# Patient Record
Sex: Female | Born: 1950
Health system: Southern US, Community
[De-identification: ages and names within clinical notes are randomized; demographics above are authoritative.]

## PROBLEM LIST (undated history)

## (undated) DIAGNOSIS — Z8589 Personal history of malignant neoplasm of other organs and systems: Secondary | ICD-10-CM

## (undated) DIAGNOSIS — C801 Malignant (primary) neoplasm, unspecified: Secondary | ICD-10-CM

## (undated) DIAGNOSIS — F329 Major depressive disorder, single episode, unspecified: Secondary | ICD-10-CM

## (undated) DIAGNOSIS — F419 Anxiety disorder, unspecified: Secondary | ICD-10-CM

## (undated) DIAGNOSIS — K219 Gastro-esophageal reflux disease without esophagitis: Secondary | ICD-10-CM

## (undated) DIAGNOSIS — E079 Disorder of thyroid, unspecified: Secondary | ICD-10-CM

## (undated) DIAGNOSIS — F32A Depression, unspecified: Secondary | ICD-10-CM

## (undated) DIAGNOSIS — H409 Unspecified glaucoma: Secondary | ICD-10-CM

## (undated) DIAGNOSIS — M26609 Unspecified temporomandibular joint disorder, unspecified side: Secondary | ICD-10-CM

## (undated) DIAGNOSIS — M1990 Unspecified osteoarthritis, unspecified site: Secondary | ICD-10-CM

## (undated) HISTORY — DX: Unspecified temporomandibular joint disorder, unspecified side: M26.609

## (undated) HISTORY — DX: Malignant (primary) neoplasm, unspecified: C80.1

## (undated) HISTORY — PX: SQUAMOUS CELL CARCINOMA EXCISION: SHX2433

## (undated) HISTORY — PX: AUGMENTATION MAMMAPLASTY: SUR837

## (undated) HISTORY — DX: Major depressive disorder, single episode, unspecified: F32.9

## (undated) HISTORY — DX: Unspecified glaucoma: H40.9

## (undated) HISTORY — PX: ABDOMINAL HYSTERECTOMY: SHX81

## (undated) HISTORY — DX: Personal history of malignant neoplasm of other organs and systems: Z85.89

## (undated) HISTORY — PX: BREAST SURGERY: SHX581

## (undated) HISTORY — DX: Gastro-esophageal reflux disease without esophagitis: K21.9

## (undated) HISTORY — DX: Unspecified osteoarthritis, unspecified site: M19.90

## (undated) HISTORY — DX: Disorder of thyroid, unspecified: E07.9

## (undated) HISTORY — DX: Anxiety disorder, unspecified: F41.9

## (undated) HISTORY — DX: Depression, unspecified: F32.A

---

## 1996-10-07 HISTORY — PX: ANTERIOR CRUCIATE LIGAMENT REPAIR: SHX115

## 1998-04-10 ENCOUNTER — Other Ambulatory Visit: Admission: RE | Admit: 1998-04-10 | Discharge: 1998-04-10 | Payer: Self-pay | Admitting: Gynecology

## 1998-09-27 ENCOUNTER — Ambulatory Visit (HOSPITAL_COMMUNITY): Admission: RE | Admit: 1998-09-27 | Discharge: 1998-09-27 | Payer: Self-pay | Admitting: Gastroenterology

## 1999-07-05 ENCOUNTER — Other Ambulatory Visit: Admission: RE | Admit: 1999-07-05 | Discharge: 1999-07-05 | Payer: Self-pay | Admitting: Gynecology

## 1999-10-18 ENCOUNTER — Encounter: Payer: Self-pay | Admitting: Specialist

## 1999-10-18 ENCOUNTER — Encounter: Admission: RE | Admit: 1999-10-18 | Discharge: 1999-10-18 | Payer: Self-pay | Admitting: Specialist

## 1999-11-06 ENCOUNTER — Encounter: Payer: Self-pay | Admitting: Specialist

## 1999-11-06 ENCOUNTER — Encounter: Admission: RE | Admit: 1999-11-06 | Discharge: 1999-11-06 | Payer: Self-pay | Admitting: Specialist

## 2005-03-05 ENCOUNTER — Other Ambulatory Visit: Admission: RE | Admit: 2005-03-05 | Discharge: 2005-03-05 | Payer: Self-pay | Admitting: Internal Medicine

## 2005-03-26 ENCOUNTER — Ambulatory Visit (HOSPITAL_COMMUNITY): Admission: RE | Admit: 2005-03-26 | Discharge: 2005-03-26 | Payer: Self-pay | Admitting: Internal Medicine

## 2006-09-17 ENCOUNTER — Ambulatory Visit (HOSPITAL_COMMUNITY): Admission: RE | Admit: 2006-09-17 | Discharge: 2006-09-17 | Payer: Self-pay | Admitting: Internal Medicine

## 2007-09-24 ENCOUNTER — Other Ambulatory Visit: Admission: RE | Admit: 2007-09-24 | Discharge: 2007-09-24 | Payer: Self-pay | Admitting: Internal Medicine

## 2007-10-08 HISTORY — PX: COLONOSCOPY: SHX174

## 2007-10-15 ENCOUNTER — Ambulatory Visit (HOSPITAL_COMMUNITY): Admission: RE | Admit: 2007-10-15 | Discharge: 2007-10-15 | Payer: Self-pay | Admitting: Internal Medicine

## 2007-12-29 ENCOUNTER — Ambulatory Visit: Payer: Self-pay | Admitting: Internal Medicine

## 2008-03-01 ENCOUNTER — Ambulatory Visit: Payer: Self-pay | Admitting: Internal Medicine

## 2008-04-05 ENCOUNTER — Ambulatory Visit: Payer: Self-pay | Admitting: Internal Medicine

## 2008-12-13 ENCOUNTER — Ambulatory Visit (HOSPITAL_COMMUNITY): Admission: RE | Admit: 2008-12-13 | Discharge: 2008-12-13 | Payer: Self-pay | Admitting: Internal Medicine

## 2010-07-10 ENCOUNTER — Ambulatory Visit (HOSPITAL_COMMUNITY): Admission: RE | Admit: 2010-07-10 | Discharge: 2010-07-10 | Payer: Self-pay | Admitting: Internal Medicine

## 2010-07-17 ENCOUNTER — Ambulatory Visit (HOSPITAL_COMMUNITY): Admission: RE | Admit: 2010-07-17 | Discharge: 2010-07-17 | Payer: Self-pay | Admitting: Internal Medicine

## 2010-10-07 HISTORY — PX: CERVICAL FUSION: SHX112

## 2011-05-22 ENCOUNTER — Encounter (HOSPITAL_COMMUNITY)
Admission: RE | Admit: 2011-05-22 | Discharge: 2011-05-22 | Disposition: A | Discharge status: Home / Self Care | Payer: BC Managed Care – PPO | Source: Ambulatory Visit | Attending: Neurological Surgery | Admitting: Neurological Surgery

## 2011-05-22 LAB — CBC
Platelets: 250 10*3/uL (ref 150–400)
RBC: 3.8 MIL/uL — ABNORMAL LOW (ref 3.87–5.11)
RDW: 12.7 % (ref 11.5–15.5)

## 2011-05-22 LAB — SURGICAL PCR SCREEN: Staphylococcus aureus: NEGATIVE

## 2011-06-03 ENCOUNTER — Inpatient Hospital Stay (HOSPITAL_COMMUNITY): Payer: BC Managed Care – PPO

## 2011-06-03 ENCOUNTER — Inpatient Hospital Stay (HOSPITAL_COMMUNITY)
Admission: RE | Admit: 2011-06-03 | Discharge: 2011-06-05 | DRG: 864 | Disposition: A | Discharge status: Home / Self Care | Payer: BC Managed Care – PPO | Source: Ambulatory Visit | Attending: Neurological Surgery | Admitting: Neurological Surgery

## 2011-06-03 DIAGNOSIS — M19019 Primary osteoarthritis, unspecified shoulder: Secondary | ICD-10-CM | POA: Diagnosis present

## 2011-06-03 DIAGNOSIS — M25819 Other specified joint disorders, unspecified shoulder: Secondary | ICD-10-CM | POA: Diagnosis present

## 2011-06-03 DIAGNOSIS — Y832 Surgical operation with anastomosis, bypass or graft as the cause of abnormal reaction of the patient, or of later complication, without mention of misadventure at the time of the procedure: Secondary | ICD-10-CM | POA: Diagnosis not present

## 2011-06-03 DIAGNOSIS — Z01812 Encounter for preprocedural laboratory examination: Secondary | ICD-10-CM

## 2011-06-03 DIAGNOSIS — T8489XA Other specified complication of internal orthopedic prosthetic devices, implants and grafts, initial encounter: Secondary | ICD-10-CM | POA: Diagnosis not present

## 2011-06-03 DIAGNOSIS — X58XXXA Exposure to other specified factors, initial encounter: Secondary | ICD-10-CM | POA: Diagnosis present

## 2011-06-03 DIAGNOSIS — M4712 Other spondylosis with myelopathy, cervical region: Secondary | ICD-10-CM | POA: Diagnosis present

## 2011-06-03 DIAGNOSIS — S43429A Sprain of unspecified rotator cuff capsule, initial encounter: Secondary | ICD-10-CM | POA: Diagnosis present

## 2011-06-03 DIAGNOSIS — M5000 Cervical disc disorder with myelopathy, unspecified cervical region: Principal | ICD-10-CM | POA: Diagnosis present

## 2011-06-03 DIAGNOSIS — G8220 Paraplegia, unspecified: Secondary | ICD-10-CM | POA: Diagnosis not present

## 2011-06-03 LAB — DIC (DISSEMINATED INTRAVASCULAR COAGULATION)PANEL
D-Dimer, Quant: 2.02 ug/mL-FEU — ABNORMAL HIGH (ref 0.00–0.48)
INR: 1 (ref 0.00–1.49)
Platelets: DECREASED 10*3/uL (ref 150–400)
Smear Review: NONE SEEN
aPTT: 21 seconds — ABNORMAL LOW (ref 24–37)

## 2011-06-03 LAB — CBC
Hemoglobin: 11.7 g/dL — ABNORMAL LOW (ref 12.0–15.0)
Platelets: DECREASED 10*3/uL (ref 150–400)

## 2011-06-04 LAB — POCT I-STAT 4, (NA,K, GLUC, HGB,HCT): HCT: 22 % — ABNORMAL LOW (ref 36.0–46.0)

## 2011-06-07 LAB — VON WILLEBRAND FACTOR MULTIMER
Factor-VIII Activity: 155 % (ref 50–180)
Ristocetin Co-Factor: 165 % (ref 42–200)

## 2011-06-13 NOTE — Op Note (Signed)
Breanna Sparks, Breanna Sparks          ACCOUNT NO.:  [id number]  MEDICAL RECORD NO.:  [id number]  LOCATION:  3102                         FACILITY:  [facility]  PHYSICIAN:  Stefani Dama, M.D.  DATE OF BIRTH:  Nov 27, 1950  DATE OF PROCEDURE:  06/03/2011 DATE OF DISCHARGE:                              OPERATIVE REPORT   PREOPERATIVE DIAGNOSIS:  Cervical spondylosis with radiculopathy at C4- 5, C5-6, and C6-C7.  POSTOPERATIVE DIAGNOSIS:  Cervical spondylosis with radiculopathy at C4- 5, C5-6, and C6-C7.  OPERATIONS:  Anterior cervical decompression at C4-5, C5-6, and C6-7, arthrodesis with structural allograft Alphatec plate fixation at C4-C7 using a new style Trestle plate.  SURGEON:  Stefani Dama, MD  FIRST ASSISTANT:  Clydene Fake, MD  ANESTHESIA:  General endotracheal.  INDICATIONS:  Breanna Sparks is a 60 year old individual who is physically fit.  She works as a Marine scientist and has had progressive difficulty with neck, shoulder, and arm pain particularly involving the left upper extremity.  She has felt weak and has advanced spondylitic changes most notably at C4-5 and C5-6.  She is taken to the operating room at this time to undergo surgical decompression and arthrodesis at C4-5, C5-6, and C6-C7.  PROCEDURE:  The patient was brought to the operating room and placed on the table in supine position.  After smooth induction of general endotracheal anesthesia, she was placed in 5 pounds of halter traction. Neck was prepped with alcohol and DuraPrep and draped in a sterile fashion.  Transverse incision was created on the left side of the neck and this was carried down to the platysma.  Plane between the sternocleidomastoid and strap muscles was dissected bluntly until the prevertebral space was reached.  First identifiable disk space was noted to be at C6-C7.  Complete diskectomy was performed at C6-C7, but attention was then turned to C5-6 and C4-5 where complete  diskectomies were also performed.  In the dissection, there was noted to be significant uncinate hypertrophy and these were drilled down carefully. The 1 and 2 mm Kerrison punches were then used to remove redundant and thickened ligament and osteophytic spur.  Hemostasis was achieved in the lateral recesses and then ultimately a 6-mm graft was shaved to the appropriate size and configuration, placed into the interspace, and countersunk appropriately.  This was done at all three spaces at C4-5 being on second and C6-C7 being on third.  Then, a 39-mm standard-sized Alphatec plate was fitted to the ventral aspect of the vertebral bodies and secured with 8 and 12 mm screws.  Once this was placed and secured, then final radiograph identified good position of the hardware. Additional demineralized bone matrix was packed into the lateral gutters.  Once hemostasis was established, then the patient was returned to the recovery room in stable.  Wound was irrigated copiously and closed with 3-0 Vicryl in an interrupted fashion on the platysma and Dermabond was used on the skin.  The patient then returned to the recovery room in stable condition.     Stefani Dama, M.D.     [name]  D:  06/03/2011  T:  06/04/2011  Job:  [id number]  Electronically Signed by Barnett Abu M.D. on 06/13/2011 03:17:02 PM

## 2011-06-13 NOTE — Op Note (Signed)
Breanna Sparks, Breanna Sparks          ACCOUNT NO.:  [id number]  MEDICAL RECORD NO.:  [id number]  LOCATION:  3102                         FACILITY:  [facility]  PHYSICIAN:  Stefani Dama, M.D.  DATE OF BIRTH:  1950/12/06  DATE OF PROCEDURE:  06/03/2011 DATE OF DISCHARGE:                              OPERATIVE REPORT   PREOPERATIVE DIAGNOSES:  Paraplegia status post anterior cervical decompression and arthrodesis C4-5, C5-6, and C6-7.  POSTOPERATIVE DIAGNOSES:  Paraplegia status post anterior cervical decompression and arthrodesis C4-5, C5-6, and C6-7.  PROCEDURE:  Exploration of anterior cervical decompression of C4-5, C5- 6, and C6-7.  SURGEON:  Stefani Dama, MD  FIRST ASSISTANT:  Coletta Memos, MD  ANESTHESIA:  General endotracheal.  INDICATIONS:  Breanna Sparks is a 60 year old individual who underwent three-level anterior cervical decompression just few hours ago in the recovery room during the postoperative phase.  She is not able to demonstrate when she moves her leg.  She has sensation in her legs, but she was not able to move.  Her upper extremity strength appeared intact. Because of the acuity of situation, immediately, postoperatively, it was elected to emergently taker her back to the operating room to explore the cervical site.  PROCEDURE IN DETAIL:  The patient was brought to the operating room supine on the stretcher.  After smooth induction of general endotracheal anesthesia, we moved as best possible with Dermabond placed on the anterior incision and then prepped the neck with DuraPrep.  We then opened the incision with some Metzenbaum scissors and in the soft tissues space, the minimal amount of bloody fluid was encountered.  The plate was identified and then sequentially I removed 12-mm screws that were securing the plate.  Of course with removing the screws, there was considerable bleeding from the bone.  However, by isolating the lowest level, I was  able to grab the interbody graft and remove it.  There was substantial amount of blood and brisk bleeding from the disk space as the graft was removed.  Further exploration yielded some epidural bleeding from the lateral aspect of the disk space fully on the right side.  This was tamponaded with some Gelfoam and a large cottonoid and attention was turned to C5-6.  The graft was removed, and a similar area of bleeding on the right side was encountered.  There was also risk liquified blood in the epidural space that was apparent.  This was evacuated, somewhat tamponaded with Gelfoam-soaked thrombin was performed with C5-6, and C4-5 graft was removed where there was not much bleeding from the epidural space.  There was, however, some ooze. Ultimately, we used Surgifoam in an effort to try to contain  the bleeding, which did well as C4-5 and also at C5-6, but at C6-7, persistent area of epidural bleeding from the region of the right nerve root persisted despite multiple packings of Surgifoam, Gelfoam, and cottonoid tamponade.  Ultimately, I was able to use electric bipolar in the epidural vessels, which secured the bleeding from this epidural space.  Care was then taken to explore every aspect of the disk spaces at the C6-7, C5-6, and C4-5 then we carefully ascertained good hemostasis.  This did take some effort, and the  patient seemed to ooze during that portion of the case as much as she did during the first portion of the case.  We sent off some coagulation studies including a PT/PTT and a platelet count, which are not back at this writing.  After we had secured the hemostasis, we replaced the bone graft and again carefully checked each interspace for adequate hemostasis.  When this was verified, I secured the 39-mm plate with the original screws.  The soft tissue hemostasis was then obtained meticulously because of the amount of oozing that there was, we elected to place a 7-mm  Al Pimple drain into the prevertebral space.  Once this was placed and the wound was inspected for hemostasis, then we finally closed the platysma with 3-0 Vicryl in interrupted fashion, and 3-0 Vicryl was used in subcuticular tissue.  A dry gauze dressing was placed on the patient's neck.  Blood loss for this portion of the procedure was estimated about 150 mL.  The patient was returned to the recovery room for later stay in the ICU.     Stefani Dama, M.D.     [name]  D:  06/03/2011  T:  06/04/2011  Job:  [id number]  Electronically Signed by Barnett Abu M.D. on 06/13/2011 03:17:05 PM

## 2011-07-23 ENCOUNTER — Other Ambulatory Visit (HOSPITAL_COMMUNITY): Payer: Self-pay | Admitting: Internal Medicine

## 2011-07-23 DIAGNOSIS — Z1231 Encounter for screening mammogram for malignant neoplasm of breast: Secondary | ICD-10-CM

## 2011-08-12 ENCOUNTER — Ambulatory Visit (HOSPITAL_COMMUNITY)
Admission: RE | Admit: 2011-08-12 | Discharge: 2011-08-12 | Disposition: A | Discharge status: Home / Self Care | Payer: BC Managed Care – PPO | Source: Ambulatory Visit | Attending: Internal Medicine | Admitting: Internal Medicine

## 2011-08-12 DIAGNOSIS — Z1231 Encounter for screening mammogram for malignant neoplasm of breast: Secondary | ICD-10-CM | POA: Insufficient documentation

## 2012-09-07 ENCOUNTER — Other Ambulatory Visit (HOSPITAL_COMMUNITY): Payer: Self-pay | Admitting: Internal Medicine

## 2012-09-07 DIAGNOSIS — Z1231 Encounter for screening mammogram for malignant neoplasm of breast: Secondary | ICD-10-CM

## 2012-09-24 ENCOUNTER — Ambulatory Visit (HOSPITAL_COMMUNITY)
Admission: RE | Admit: 2012-09-24 | Discharge: 2012-09-24 | Disposition: A | Discharge status: Home / Self Care | Payer: BC Managed Care – PPO | Source: Ambulatory Visit | Attending: Internal Medicine | Admitting: Internal Medicine

## 2012-09-24 DIAGNOSIS — Z1231 Encounter for screening mammogram for malignant neoplasm of breast: Secondary | ICD-10-CM

## 2013-09-06 ENCOUNTER — Other Ambulatory Visit: Payer: Self-pay | Admitting: Physician Assistant

## 2013-09-06 DIAGNOSIS — Z1231 Encounter for screening mammogram for malignant neoplasm of breast: Secondary | ICD-10-CM

## 2013-09-28 ENCOUNTER — Other Ambulatory Visit: Payer: Self-pay | Admitting: Physician Assistant

## 2013-09-28 ENCOUNTER — Ambulatory Visit (HOSPITAL_COMMUNITY)
Admission: RE | Admit: 2013-09-28 | Discharge: 2013-09-28 | Disposition: A | Discharge status: Home / Self Care | Payer: BC Managed Care – PPO | Source: Ambulatory Visit | Attending: Physician Assistant | Admitting: Physician Assistant

## 2013-09-28 DIAGNOSIS — Z1231 Encounter for screening mammogram for malignant neoplasm of breast: Secondary | ICD-10-CM

## 2013-10-25 ENCOUNTER — Other Ambulatory Visit: Payer: Self-pay | Admitting: Internal Medicine

## 2013-11-14 DIAGNOSIS — F419 Anxiety disorder, unspecified: Secondary | ICD-10-CM | POA: Insufficient documentation

## 2013-11-14 DIAGNOSIS — Z8589 Personal history of malignant neoplasm of other organs and systems: Secondary | ICD-10-CM | POA: Insufficient documentation

## 2013-11-14 DIAGNOSIS — M1990 Unspecified osteoarthritis, unspecified site: Secondary | ICD-10-CM | POA: Insufficient documentation

## 2013-11-14 DIAGNOSIS — F325 Major depressive disorder, single episode, in full remission: Secondary | ICD-10-CM | POA: Insufficient documentation

## 2013-11-14 DIAGNOSIS — H409 Unspecified glaucoma: Secondary | ICD-10-CM | POA: Insufficient documentation

## 2013-11-16 ENCOUNTER — Ambulatory Visit (INDEPENDENT_AMBULATORY_CARE_PROVIDER_SITE_OTHER): Payer: BC Managed Care – PPO | Admitting: Physician Assistant

## 2013-11-16 ENCOUNTER — Encounter: Payer: Self-pay | Admitting: Physician Assistant

## 2013-11-16 VITALS — BP 102/60 | HR 60 | Temp 98.2°F | Resp 16 | Ht 65.0 in | Wt 125.0 lb

## 2013-11-16 DIAGNOSIS — F32A Depression, unspecified: Secondary | ICD-10-CM

## 2013-11-16 DIAGNOSIS — Z79899 Other long term (current) drug therapy: Secondary | ICD-10-CM

## 2013-11-16 DIAGNOSIS — E559 Vitamin D deficiency, unspecified: Secondary | ICD-10-CM

## 2013-11-16 DIAGNOSIS — M1990 Unspecified osteoarthritis, unspecified site: Secondary | ICD-10-CM

## 2013-11-16 DIAGNOSIS — Z0000 Encounter for general adult medical examination without abnormal findings: Secondary | ICD-10-CM

## 2013-11-16 DIAGNOSIS — F329 Major depressive disorder, single episode, unspecified: Secondary | ICD-10-CM

## 2013-11-16 DIAGNOSIS — Z23 Encounter for immunization: Secondary | ICD-10-CM

## 2013-11-16 DIAGNOSIS — M25519 Pain in unspecified shoulder: Secondary | ICD-10-CM

## 2013-11-16 DIAGNOSIS — Z1211 Encounter for screening for malignant neoplasm of colon: Secondary | ICD-10-CM

## 2013-11-16 DIAGNOSIS — M129 Arthropathy, unspecified: Secondary | ICD-10-CM

## 2013-11-16 LAB — HEPATIC FUNCTION PANEL
ALBUMIN: 4.3 g/dL (ref 3.5–5.2)
ALT: 36 U/L — ABNORMAL HIGH (ref 0–35)
AST: 41 U/L — AB (ref 0–37)
Alkaline Phosphatase: 50 U/L (ref 39–117)
BILIRUBIN DIRECT: 0.1 mg/dL (ref 0.0–0.3)
Indirect Bilirubin: 0.5 mg/dL (ref 0.2–1.2)
TOTAL PROTEIN: 6.6 g/dL (ref 6.0–8.3)
Total Bilirubin: 0.6 mg/dL (ref 0.2–1.2)

## 2013-11-16 LAB — LIPID PANEL
Cholesterol: 214 mg/dL — ABNORMAL HIGH (ref 0–200)
HDL: 85 mg/dL (ref >39)
LDL CALC: 106 mg/dL — AB (ref 0–99)
Total CHOL/HDL Ratio: 2.5 Ratio
Triglycerides: 113 mg/dL (ref <150)
VLDL: 23 mg/dL (ref 0–40)

## 2013-11-16 LAB — CBC WITH DIFFERENTIAL/PLATELET
BASOS ABS: 0 10*3/uL (ref 0.0–0.1)
BASOS PCT: 0 % (ref 0–1)
EOS ABS: 0.3 10*3/uL (ref 0.0–0.7)
EOS PCT: 5 % (ref 0–5)
HCT: 38.9 % (ref 36.0–46.0)
HEMOGLOBIN: 13.2 g/dL (ref 12.0–15.0)
LYMPHS ABS: 2.2 10*3/uL (ref 0.7–4.0)
LYMPHS PCT: 40 % (ref 12–46)
MCH: 34 pg (ref 26.0–34.0)
MCHC: 33.9 g/dL (ref 30.0–36.0)
MCV: 100.3 fL — ABNORMAL HIGH (ref 78.0–100.0)
MONO ABS: 0.4 10*3/uL (ref 0.1–1.0)
Monocytes Relative: 8 % (ref 3–12)
NEUTROS ABS: 2.5 10*3/uL (ref 1.7–7.7)
Neutrophils Relative %: 47 % (ref 43–77)
Platelets: 302 10*3/uL (ref 150–400)
RBC: 3.88 MIL/uL (ref 3.87–5.11)
RDW: 13.2 % (ref 11.5–15.5)
WBC: 5.4 10*3/uL (ref 4.0–10.5)

## 2013-11-16 LAB — BASIC METABOLIC PANEL WITH GFR
BUN: 19 mg/dL (ref 6–23)
CHLORIDE: 101 meq/L (ref 96–112)
CO2: 27 meq/L (ref 19–32)
Calcium: 9.1 mg/dL (ref 8.4–10.5)
Creat: 0.81 mg/dL (ref 0.50–1.10)
GFR, Est African American: 89 mL/min
GFR, Est Non African American: 78 mL/min
GLUCOSE: 89 mg/dL (ref 70–99)
POTASSIUM: 4.2 meq/L (ref 3.5–5.3)
SODIUM: 136 meq/L (ref 135–145)

## 2013-11-16 LAB — IRON AND TIBC
%SAT: 42 % (ref 20–55)
Iron: 143 ug/dL (ref 42–145)
TIBC: 343 ug/dL (ref 250–470)
UIBC: 200 ug/dL (ref 125–400)

## 2013-11-16 LAB — MAGNESIUM: Magnesium: 2 mg/dL (ref 1.5–2.5)

## 2013-11-16 MED ORDER — ZOSTER VACCINE LIVE 19400 UNT/0.65ML ~~LOC~~ SOLR: 0.6500 mL | Freq: Once | SUBCUTANEOUS | Status: DC

## 2013-11-16 MED ORDER — DEXAMETHASONE SODIUM PHOSPHATE 100 MG/10ML IJ SOLN
10.0000 mg | Freq: Once | INTRAMUSCULAR | Status: AC
  Administered 2013-11-16: 10 mg via INTRAMUSCULAR

## 2013-11-16 MED ORDER — LORAZEPAM 2 MG PO TABS: ORAL_TABLET | ORAL | Status: DC

## 2013-11-16 MED ORDER — VALACYCLOVIR HCL 1 G PO TABS: 1000.0000 mg | ORAL_TABLET | Freq: Every day | ORAL | Status: DC

## 2013-11-16 MED ORDER — CYCLOBENZAPRINE HCL 10 MG PO TABS: 10.0000 mg | ORAL_TABLET | Freq: Three times a day (TID) | ORAL | Status: DC | PRN

## 2013-11-16 MED ORDER — CITALOPRAM HYDROBROMIDE 40 MG PO TABS: 40.0000 mg | ORAL_TABLET | Freq: Every day | ORAL | Status: DC

## 2013-11-16 NOTE — Progress Notes (Signed)
Complete Physical HPI 63 y.o. female  presents for a complete physical. Her blood pressure has been controlled at home, today their BP is BP: 102/60 mmHg She denies chest pain, shortness of breath, dizziness.  Her cholesterol is diet controlled.  Her cholesterol is controlled. The cholesterol last visit was:  LDL 114 States energy level has been down, has not worked out often over past year.  Patient is on Vitamin D supplement.  History of SCC, follows with DERM q 3 months.  Patient is on Celexa 20 and states she is doing well with it.  Complains left shoulder pain, no numbness tingling, pain with abduction.   Current Medications:  Current Outpatient Prescriptions on File Prior to Visit  Medication Sig Dispense Refill  . citalopram (CELEXA) 40 MG tablet Take 40 mg by mouth daily.      . Cyanocobalamin (VITAMIN B-12 PO) Take by mouth daily.      Marland Kitchen LORazepam (ATIVAN) 2 MG tablet TAKE ONE-HALF TO ONE TABLET BY MOUTH AT BEDTIME AS NEEDED  60 tablet  0   No current facility-administered medications on file prior to visit.   Health Maintenance:  Tetanus: 2014 Pneumovax: N/A Flu vaccine: 06/2013 Zostavax: covered by insurance Pap: TAH not needed MGM: 09/2013 neg DEXA: ? Small frame, no history of recent broken bones, early menopause (age 84) currently on estrogen/progesterone cream, not smoker.  Colonoscopy: 03/2008- repeat 10 years Dr. Olevia Perches EGD: 03/2008   Allergies:  Allergies  Allergen Reactions  . Codeine     REACTION: Itch/nausea/vomiting  . Meperidine Hcl     REACTION: itch  . Morphine     REACTION: tachycardia   Medical History:  Past Medical History  Diagnosis Date  . Depression   . Anxiety   . Glaucoma   . Arthritis   . History of squamous cell carcinoma    Surgical History:  Past Surgical History  Procedure Laterality Date  . Abdominal hysterectomy      age 1  . Breast surgery      implants  . Cervical fusion  2012    Dr. Ellene Route  . Squamous cell  carcinoma excision      Multiple  . Anterior cruciate ligament repair Bilateral 1998   Family History:  Family History  Problem Relation Age of Onset  . Hypertension Mother   . Diabetes Mother   . Heart disease Mother   . Glaucoma Mother   . Heart attack Father    Social History:  History   Social History  . Marital Status: Married    Spouse Name: N/A    Number of Children: N/A  . Years of Education: N/A   Occupational History  . Not on file.   Social History Main Topics  . Smoking status: Never Smoker   . Smokeless tobacco: Not on file  . Alcohol Use: Yes     Comment: Occasion  . Drug Use: No  . Sexual Activity: Not on file   Other Topics Concern  . Not on file   Social History Narrative  . No narrative on file   ROS Constitutional: Denies weight loss/gain, headaches, insomnia, fatigue, night sweats, and change in appetite. Eyes: Dr. Nicki Reaper  DEE q 3 months, + [place] Denies redness, blurred vision, diplopia, discharge, itchy, watery eyes.  ENT: Denies discharge, congestion, post nasal drip, sore throat, earache, hearing loss, dental pain, Tinnitus, Vertigo, Sinus pain, snoring.  Cardio: Denies chest pain, palpitations, irregular heartbeat, dyspnea, diaphoresis, orthopnea, PND, claudication, edema Respiratory: denies  cough, dyspnea, pleurisy, hoarseness, wheezing.  Gastrointestinal: Dr. Olevia Perches + constipation Denies dysphagia, heartburn, pain, cramps, nausea, vomiting, bloating, diarrhea,  hematemesis, melena, hematochezia, hemorrhoids Genitourinary: Denies dysuria, frequency, urgency, nocturia, hesitancy, discharge, hematuria, flank pain Breast: Denies Breast lumps, nipple discharge, bleeding.  Musculoskeletal: (Dr. Ellene Route- neck) + myalgia, joint pain, complains of right finger being locked in the morning. Denies arthralgia, myalgia, stiffness, Jt. Swelling, pain, Skin: (Dr. [place])  Denies pruritis, rash, hives,  acne, eczema, changing in skin lesion Neuro: Denies  Weakness, tremor, incoordination, spasms, paresthesia, pain Psychiatric: Denies confusion, memory loss, sensory loss Endocrine: Denies change in weight, skin, hair change, nocturia, and paresthesia, Diabetic Denies Polys, visual blurring, hyper /hypo glycemic episodes.  Heme/Lymph: Denies Excessive bleeding, bruising, enlarged lymph nodes  Physical Exam: Estimated body mass index is 20.8 kg/(m^2) as calculated from the following:   Height as of this encounter: 5\' 5"  (1.651 m).   Weight as of this encounter: 125 lb (56.7 kg). Filed Vitals:   11/16/13 0916  BP: 102/60  Pulse: 60  Temp: 98.2 F (36.8 C)  Resp: 16   General Appearance: Well nourished, in no apparent distress. Eyes: PERRLA, EOMs, conjunctiva no swelling or erythema, normal fundi and vessels. Sinuses: No Frontal/maxillary tenderness ENT/Mouth: Ext aud canals clear, normal light reflex with TMs without erythema, bulging.  Good dentition. No erythema, swelling, or exudate on post pharynx. Tonsils not swollen or erythematous. Hearing normal.  Neck: Supple, thyroid normal. No bruits Respiratory: Respiratory effort normal, BS equal bilaterally without rales, rhonchi, wheezing or stridor. Cardio: RRR without murmurs, rubs or gallops. Brisk peripheral pulses without edema.  Chest: symmetric, with normal excursions and percussion. Breasts: Symmetric, without lumps, nipple discharge, retractions. + bilateral implants Abdomen: Soft, +BS. Non tender, no guarding, rebound, hernias, masses, or organomegaly. .  Lymphatics: Non tender without lymphadenopathy.  Genitourinary: defer Musculoskeletal: Full ROM all peripheral extremities,5/5 strength, and normal gait. Left shoulder with tenderness pinpoint at left posterior shoulder, mild pain with abduction to 180.  Skin: Warm, dry without rashes, lesions, ecchymosis.  Neuro: Cranial nerves intact, reflexes equal bilaterally. Normal muscle tone, no cerebellar symptoms. Sensation intact.   Psych: Awake and oriented X 3, normal affect, Insight and Judgment appropriate.   EKG: WNL no changes.  Assessment and Plan: Depression- continue citalopram, doing well  Anxiety- continue meds  Glaucoma- continue drops, continue follow up.   Arthritis- some trigger finer  History of squamous cell carcinoma- continue derm follow up  Insomnia- cont ativan at night Shingles vaccine today Pap- not needed Left shoulder pain- exercise, trigger point injection given.  Discussed med's effects and SE's. Screening labs and tests as requested with regular follow-up as recommended.   Vicie Mutters 9:39 AM

## 2013-11-16 NOTE — Patient Instructions (Addendum)
[place] look on [place] What is the TMJ? The temporomandibular (tem-PUH-ro-man-DIB-yoo-ler) joint, or the TMJ, connects the upper and lower jawbones. This joint allows the jaw to open wide and move back and forth when you chew, talk, or yawn.There are also several muscles that help this joint move. There can be muscle tightness and pain in the muscle that can cause several symptoms.  What causes TMJ pain? There are many causes of TMJ pain. Repeated chewing (for example, chewing gum) and clenching your teeth can cause pain in the joint. Some TMJ pain has no obvious cause. What can I do to ease the pain? There are many things you can do to help your pain get better. When you have pain:  Eat soft foods and stay away from chewy foods (for example, taffy) Try to use both sides of your mouth to chew Don't chew gum Don't open your mouth wide (for example, during yawning or singing) Don't bite your cheeks or fingernails Lower your amount of stress and worry Applying a warm, damp washcloth to the joint may help. Over-the-counter pain medicines such as ibuprofen (one brand: Advil) or acetaminophen (one brand: Tylenol) might also help. Do not use these medicines if you are allergic to them or if your doctor told you not to use them. How can I stop the pain from coming back? When your pain is better, you can do these exercises to make your muscles stronger and to keep the pain from coming back:  Resisted mouth opening: Place your thumb or two fingers under your chin and open your mouth slowly, pushing up lightly on your chin with your thumb. Hold for three to six seconds. Close your mouth slowly. Resisted mouth closing: Place your thumbs under your chin and your two index fingers on the ridge between your mouth and the bottom of your chin. Push down lightly on your chin as you close your mouth. Tongue up: Slowly open and close your mouth while keeping the tongue touching the roof of the mouth. Side-to-side  jaw movement: Place an object about one fourth of an inch thick (for example, two tongue depressors) between your front teeth. Slowly move your jaw from side to side. Increase the thickness of the object as the exercise becomes easier Forward jaw movement: Place an object about one fourth of an inch thick between your front teeth and move the bottom jaw forward so that the bottom teeth are in front of the top teeth. Increase the thickness of the object as the exercise becomes easier. These exercises should not be painful. If it hurts to do these exercises, stop doing them and talk to your family doctor.   Diet for Gastroesophageal Reflux Disease, Adult Reflux (acid reflux) is when acid from your stomach flows up into the esophagus. When acid comes in contact with the esophagus, the acid causes irritation and soreness (inflammation) in the esophagus. When reflux happens often or so severely that it causes damage to the esophagus, it is called gastroesophageal reflux disease (GERD). Nutrition therapy can help ease the discomfort of GERD. FOODS OR DRINKS TO AVOID OR LIMIT  Smoking or chewing tobacco. Nicotine is one of the most potent stimulants to acid production in the gastrointestinal tract.  Caffeinated and decaffeinated coffee and black tea.  Regular or low-calorie carbonated beverages or energy drinks (caffeine-free carbonated beverages are allowed).   Strong spices, such as black pepper, white pepper, red pepper, cayenne, curry powder, and chili powder.  Peppermint or spearmint.  Chocolate.  High-fat foods, including meats and fried foods. Extra added fats including oils, butter, salad dressings, and nuts. Limit these to less than 8 tsp per day.  Fruits and vegetables if they are not tolerated, such as citrus fruits or tomatoes.  Alcohol.  Any food that seems to aggravate your condition. If you have questions regarding your diet, call your caregiver or a registered dietitian. OTHER  THINGS THAT MAY HELP GERD INCLUDE:   Eating your meals slowly, in a relaxed setting.  Eating 5 to 6 small meals per day instead of 3 large meals.  Eliminating food for a period of time if it causes distress.  Not lying down until 3 hours after eating a meal.  Keeping the head of your bed raised 6 to 9 inches (15 to 23 cm) by using a foam wedge or blocks under the legs of the bed. Lying flat may make symptoms worse.  Being physically active. Weight loss may be helpful in reducing reflux in overweight or obese adults.  Wear loose fitting clothing EXAMPLE MEAL PLAN This meal plan is approximately 2,000 calories based on [email or web address] meal planning guidelines. Breakfast   cup cooked oatmeal.  1 cup strawberries.  1 cup low-fat milk.  1 oz almonds. Snack  1 cup cucumber slices.  6 oz yogurt (made from low-fat or fat-free milk). Lunch  2 slice whole-wheat bread.  2 oz sliced [place].  2 tsp mayonnaise.  1 cup blueberries.  1 cup snap peas. Snack  6 whole-wheat crackers.  1 oz string cheese. Dinner   cup brown rice.  1 cup mixed veggies.  1 tsp olive oil.  3 oz grilled fish. Document Released: 09/23/2005 Document Revised: 12/16/2011 Document Reviewed: 08/09/2011 [facility] Patient Information 2014 [place], [place].

## 2013-11-17 LAB — MICROALBUMIN / CREATININE URINE RATIO
Creatinine, Urine: 70.1 mg/dL
Microalb Creat Ratio: 7.1 mg/g (ref 0.0–30.0)
Microalb, Ur: 0.5 mg/dL (ref 0.00–1.89)

## 2013-11-17 LAB — URINALYSIS, ROUTINE W REFLEX MICROSCOPIC
BILIRUBIN URINE: NEGATIVE
Glucose, UA: NEGATIVE mg/dL
HGB URINE DIPSTICK: NEGATIVE
KETONES UR: NEGATIVE mg/dL
LEUKOCYTES UA: NEGATIVE
NITRITE: NEGATIVE
Protein, ur: NEGATIVE mg/dL
SPECIFIC GRAVITY, URINE: 1.017 (ref 1.005–1.030)
UROBILINOGEN UA: 0.2 mg/dL (ref 0.0–1.0)
pH: 6 (ref 5.0–8.0)

## 2013-11-17 LAB — VITAMIN B12: VITAMIN B 12: 546 pg/mL (ref 211–911)

## 2013-11-17 LAB — INSULIN, FASTING: INSULIN FASTING, SERUM: 2 u[IU]/mL — AB (ref 3–28)

## 2013-11-17 LAB — HEMOGLOBIN A1C
HEMOGLOBIN A1C: 5.1 % (ref <5.7)
MEAN PLASMA GLUCOSE: 100 mg/dL (ref <117)

## 2013-11-17 LAB — VITAMIN D 25 HYDROXY (VIT D DEFICIENCY, FRACTURES): VIT D 25 HYDROXY: 95 ng/mL — AB (ref 30–89)

## 2013-11-17 LAB — TSH: TSH: 2.074 u[IU]/mL (ref 0.350–4.500)

## 2014-01-12 ENCOUNTER — Ambulatory Visit (INDEPENDENT_AMBULATORY_CARE_PROVIDER_SITE_OTHER): Payer: BC Managed Care – PPO | Admitting: Physician Assistant

## 2014-01-12 ENCOUNTER — Encounter: Payer: Self-pay | Admitting: Physician Assistant

## 2014-01-12 VITALS — BP 120/70 | HR 72 | Temp 97.9°F | Resp 16 | Ht 65.0 in | Wt 123.0 lb

## 2014-01-12 DIAGNOSIS — F3289 Other specified depressive episodes: Secondary | ICD-10-CM

## 2014-01-12 DIAGNOSIS — F329 Major depressive disorder, single episode, unspecified: Secondary | ICD-10-CM

## 2014-01-12 DIAGNOSIS — F32A Depression, unspecified: Secondary | ICD-10-CM

## 2014-01-12 MED ORDER — ESCITALOPRAM OXALATE 20 MG PO TABS: 20.0000 mg | ORAL_TABLET | Freq: Every day | ORAL | Status: DC

## 2014-01-12 NOTE — Patient Instructions (Signed)
Seasonal Affective Disorder  A seasonal affective disorder is a depressive reaction. It is when you feel emotionally down, which seems to come at specific times of the year. The most common time of year for this is winter. Otherwise, it behaves like a plain depression. As with other depressive disorders, there are:  Crying episodes.  Headaches.  Irritability.  Loss of energy. DIAGNOSIS  The diagnosis of this problem is usually made by the history (what has been going on). A physical exam may be done to make sure there is no other cause of your depression. TREATMENT  The treatment of seasonal affective disorders has been found to be helped immensely by photo-therapy. This means a person sits or lies for several hours per day in front of or under bright lights. The symptoms (problems) of depression respond rapidly, usually over a couple days. HOME CARE INSTRUCTIONS   Follow your caregiver's instructions for light therapy.  You must be awake during the light therapy.  If you do not respond or you feel you are getting worse, see your caregiver. Document Released: 06/18/2001 Document Revised: 12/16/2011 Document Reviewed: 01/09/2006 [facility] Patient Information 2014 [place], [place]. Depression, Adult Depression refers to feeling sad, low, down in the dumps, blue, gloomy, or empty. In general, there are two kinds of depression: 1. Depression that we all experience from time to time because of upsetting life experiences, including the loss of a job or the ending of a relationship (normal sadness or normal grief). This kind of depression is considered normal, is short lived, and resolves within a few days to 2 weeks. (Depression experienced after the loss of a loved one is called bereavement. Bereavement often lasts longer than 2 weeks but normally gets better with time.) 2. Clinical depression, which lasts longer than normal sadness or normal grief or interferes with your ability to function at  home, at work, and in school. It also interferes with your personal relationships. It affects almost every aspect of your life. Clinical depression is an illness. Symptoms of depression also can be caused by conditions other than normal sadness and grief or clinical depression. Examples of these conditions are listed as follows:  Physical illness Some physical illnesses, including underactive thyroid gland (hypothyroidism), severe anemia, specific types of cancer, diabetes, uncontrolled seizures, heart and lung problems, strokes, and chronic pain are commonly associated with symptoms of depression.  Side effects of some prescription medicine In some people, certain types of prescription medicine can cause symptoms of depression.  Substance abuse Abuse of alcohol and illicit drugs can cause symptoms of depression. SYMPTOMS Symptoms of normal sadness and normal grief include the following:  Feeling sad or crying for short periods of time.  Not caring about anything (apathy).  Difficulty sleeping or sleeping too much.  No longer able to enjoy the things you used to enjoy.  Desire to be by oneself all the time (social isolation).  Lack of energy or motivation.  Difficulty concentrating or remembering.  Change in appetite or weight.  Restlessness or agitation. Symptoms of clinical depression include the same symptoms of normal sadness or normal grief and also the following symptoms:  Feeling sad or crying all the time.  Feelings of guilt or worthlessness.  Feelings of hopelessness or helplessness.  Thoughts of suicide or the desire to harm yourself (suicidal ideation).  Loss of touch with reality (psychotic symptoms). Seeing or hearing things that are not real (hallucinations) or having false beliefs about your life or the people around you (delusions  and paranoia). DIAGNOSIS  The diagnosis of clinical depression usually is based on the severity and duration of the symptoms. Your  caregiver also will ask you questions about your medical history and substance use to find out if physical illness, use of prescription medicine, or substance abuse is causing your depression. Your caregiver also may order blood tests. TREATMENT  Typically, normal sadness and normal grief do not require treatment. However, sometimes antidepressant medicine is prescribed for bereavement to ease the depressive symptoms until they resolve. The treatment for clinical depression depends on the severity of your symptoms but typically includes antidepressant medicine, counseling with a mental health professional, or a combination of both. Your caregiver will help to determine what treatment is best for you. Depression caused by physical illness usually goes away with appropriate medical treatment of the illness. If prescription medicine is causing depression, talk with your caregiver about stopping the medicine, decreasing the dose, or substituting another medicine. Depression caused by abuse of alcohol or illicit drugs abuse goes away with abstinence from these substances. Some adults need professional help in order to stop drinking or using drugs. SEEK IMMEDIATE CARE IF:  You have thoughts about hurting yourself or others.  You lose touch with reality (have psychotic symptoms).  You are taking medicine for depression and have a serious side effect. FOR MORE INFORMATION National [employer] on Mental Illness: www.nami.[employer]: https://carter.com/ Document Released: 09/20/2000 Document Revised: 03/24/2012 Document Reviewed: 12/23/2011 [facility] Patient Information 2014 [place].

## 2014-01-12 NOTE — Progress Notes (Signed)
   Subjective:    Patient ID: Breanna Sparks, female    DOB: [birth year], 63 y.o.   MRN: [id number]  HPI 63 y.o. presents for worsening depression/anxiety since the holidays. She states that her son had a suicide attempt and she has been worried about him. She is crying now talking about it, she states she does not want to get out of the bed, increased worry, crying, sleeping too much, decreased interest in doing things. She states it is worse during the winter, she will not leave the house for 3 weeks at a time, which she states tearfully that that is not her nature. She is not working out/exercising. She has felt a bit better with the weather improving, but she has had several skin surgeries due to [facility] so she is unable to get in the sun as she has in the past. She has had suicidal ideations but denies a plan. She has tried zoloft, wellbutrin, and prozac in the past without in any help. She has been cymbalta in the past but states it was too expensive and stopped.   Review of Systems  Constitutional: Positive for activity change, appetite change and fatigue. Negative for fever, diaphoresis and unexpected weight change.  HENT: Negative.   Respiratory: Negative.   Cardiovascular: Positive for palpitations (with panic attacks). Negative for chest pain and leg swelling.  Gastrointestinal: Negative.   Genitourinary: Negative.   Musculoskeletal: Negative.   Neurological: Negative.   Psychiatric/Behavioral: Positive for suicidal ideas (denies plan or intention to do it), sleep disturbance, dysphoric mood and decreased concentration. Negative for hallucinations, behavioral problems, confusion, self-injury and agitation. The patient is nervous/anxious. The patient is not hyperactive.        Objective:   Physical Exam  Constitutional: She is oriented to person, place, and time. She appears well-developed and well-nourished.  HENT:  Head: Normocephalic and atraumatic.  Right Ear: External ear  normal.  Left Ear: External ear normal.  Mouth/Throat: Oropharynx is clear and moist.  Eyes: Conjunctivae and EOM are normal. Pupils are equal, round, and reactive to light.  Neck: Normal range of motion. Neck supple. No thyromegaly present.  Cardiovascular: Normal rate, regular rhythm and normal heart sounds.  Exam reveals no gallop and no friction rub.   No murmur heard. Pulmonary/Chest: Effort normal and breath sounds normal. No respiratory distress. She has no wheezes.  Abdominal: Soft. Bowel sounds are normal. She exhibits no distension and no mass. There is no tenderness. There is no rebound and no guarding.  Musculoskeletal: Normal range of motion.  Lymphadenopathy:    She has no cervical adenopathy.  Neurological: She is alert and oriented to person, place, and time. She displays normal reflexes. No cranial nerve deficit. Coordination normal.  Skin: Skin is warm and dry.  Psychiatric: Her speech is not rapid and/or pressured, not delayed and not slurred. She exhibits a depressed mood. She expresses no homicidal and no suicidal ideation. She expresses no suicidal plans and no homicidal plans.       Assessment & Plan:  Depression/SAD- suggest counseling, increase exercise, 2 weeks follow up and will check labs at that time. Stop celexa and can switch to lexapro 20mg  Daily.

## 2014-02-03 ENCOUNTER — Ambulatory Visit (INDEPENDENT_AMBULATORY_CARE_PROVIDER_SITE_OTHER): Payer: BC Managed Care – PPO | Admitting: Physician Assistant

## 2014-02-03 ENCOUNTER — Encounter: Payer: Self-pay | Admitting: Physician Assistant

## 2014-02-03 VITALS — BP 128/70 | HR 64 | Temp 98.9°F | Resp 16 | Wt 122.0 lb

## 2014-02-03 DIAGNOSIS — F329 Major depressive disorder, single episode, unspecified: Secondary | ICD-10-CM

## 2014-02-03 DIAGNOSIS — F3289 Other specified depressive episodes: Secondary | ICD-10-CM

## 2014-02-03 DIAGNOSIS — G4700 Insomnia, unspecified: Secondary | ICD-10-CM

## 2014-02-03 DIAGNOSIS — F32A Depression, unspecified: Secondary | ICD-10-CM

## 2014-02-03 DIAGNOSIS — F411 Generalized anxiety disorder: Secondary | ICD-10-CM

## 2014-02-03 DIAGNOSIS — F419 Anxiety disorder, unspecified: Secondary | ICD-10-CM

## 2014-02-03 DIAGNOSIS — Z23 Encounter for immunization: Secondary | ICD-10-CM

## 2014-02-03 MED ORDER — ESCITALOPRAM OXALATE 20 MG PO TABS: 20.0000 mg | ORAL_TABLET | Freq: Every day | ORAL | Status: DC

## 2014-02-03 MED ORDER — OMEPRAZOLE 40 MG PO CPDR: 40.0000 mg | DELAYED_RELEASE_CAPSULE | Freq: Every day | ORAL | Status: DC

## 2014-02-03 MED ORDER — CYCLOBENZAPRINE HCL 10 MG PO TABS: 10.0000 mg | ORAL_TABLET | Freq: Three times a day (TID) | ORAL | Status: DC | PRN

## 2014-02-03 MED ORDER — TRAZODONE HCL 150 MG PO TABS: 150.0000 mg | ORAL_TABLET | Freq: Every day | ORAL | Status: DC

## 2014-02-03 NOTE — Progress Notes (Signed)
HPI 63 y.o.female presents for depression. She was switched from celexa to lexapro 20 and she states that she is doing much better. She is having to use lorazepam 2mg  two at night to sleep.   Past Medical History  Diagnosis Date  . Depression   . Anxiety   . Glaucoma   . Arthritis   . History of squamous cell carcinoma   . TMJ (temporomandibular joint syndrome)     wears gaurd at night  . GERD (gastroesophageal reflux disease)      Allergies  Allergen Reactions  . Codeine     REACTION: Itch/nausea/vomiting  . Meperidine Hcl     REACTION: itch  . Morphine     REACTION: tachycardia      Current Outpatient Prescriptions on File Prior to Visit  Medication Sig Dispense Refill  . Cholecalciferol (VITAMIN D PO) Take 1,000 Int'l Units by mouth daily.      . Cyanocobalamin (VITAMIN B-12 PO) Take by mouth daily.      . cyclobenzaprine (FLEXERIL) 10 MG tablet Take 1 tablet (10 mg total) by mouth 3 (three) times daily as needed for muscle spasms.  90 tablet  1  . escitalopram (LEXAPRO) 20 MG tablet Take 1 tablet (20 mg total) by mouth daily.  30 tablet  2  . LORazepam (ATIVAN) 2 MG tablet TAKE ONE-HALF TO ONE TABLET BY MOUTH AT BEDTIME AS NEEDED  90 tablet  1  . omeprazole (PRILOSEC) 40 MG capsule Take 40 mg by mouth daily.       No current facility-administered medications on file prior to visit.    ROS: all negative expect above.   Physical: Filed Weights   02/03/14 1428  Weight: 122 lb (55.339 kg)   BP 128/70  Pulse 64  Temp(Src) 98.9 F (37.2 C)  Resp 16  Wt 122 lb (55.339 kg) General Appearance: Well nourished, in no apparent distress. Eyes: PERRLA, EOMs. Sinuses: No Frontal/maxillary tenderness ENT/Mouth: Ext aud canals clear, normal light reflex with TMs without erythema, bulging. Post pharynx without erythema, swelling, exudate.  Respiratory: CTAB Cardio: RRR, no murmurs, rubs or gallops. Peripheral pulses brisk and equal bilaterally, without edema. No aortic or  femoral bruits. Abdomen: Soft, with bowl sounds. Nontender, no guarding, rebound. Lymphatics: Non tender without lymphadenopathy.  Musculoskeletal: Full ROM all peripheral extremities, 5/5 strength, and normal gait. Skin: Warm, dry without rashes, lesions, ecchymosis.  Neuro: Cranial nerves intact, reflexes equal bilaterally. Normal muscle tone, no cerebellar symptoms. Sensation intact.  Pysch: Awake and oriented X 3, normal affect, Insight and Judgment appropriate.   Assessment and Plan: Depression/hot flashes- continue Lexapro 20mg  Insomnia- good sleep hygiene discussed, increase day time activity, try trazodone 150 and hold off on lorazepam, she states she has 10 left which would be 5 days, lunesta 3 mg samples given as well.   Follow up in Aug unless needs anything

## 2014-02-03 NOTE — Patient Instructions (Signed)

## 2014-03-02 ENCOUNTER — Encounter: Payer: Self-pay | Admitting: Physician Assistant

## 2014-03-02 ENCOUNTER — Other Ambulatory Visit: Payer: Self-pay | Admitting: Physician Assistant

## 2014-03-02 MED ORDER — TRAZODONE HCL 150 MG PO TABS: 150.0000 mg | ORAL_TABLET | Freq: Every day | ORAL | Status: DC

## 2014-05-17 ENCOUNTER — Ambulatory Visit (INDEPENDENT_AMBULATORY_CARE_PROVIDER_SITE_OTHER): Payer: BC Managed Care – PPO | Admitting: Physician Assistant

## 2014-05-17 ENCOUNTER — Ambulatory Visit: Payer: Self-pay | Admitting: Physician Assistant

## 2014-05-17 VITALS — BP 130/78 | HR 76 | Temp 98.2°F | Resp 16 | Ht 65.0 in | Wt 119.0 lb

## 2014-05-17 DIAGNOSIS — F329 Major depressive disorder, single episode, unspecified: Secondary | ICD-10-CM

## 2014-05-17 DIAGNOSIS — F3289 Other specified depressive episodes: Secondary | ICD-10-CM

## 2014-05-17 DIAGNOSIS — Z1159 Encounter for screening for other viral diseases: Secondary | ICD-10-CM

## 2014-05-17 DIAGNOSIS — Z79899 Other long term (current) drug therapy: Secondary | ICD-10-CM

## 2014-05-17 DIAGNOSIS — F32A Depression, unspecified: Secondary | ICD-10-CM

## 2014-05-17 DIAGNOSIS — R7989 Other specified abnormal findings of blood chemistry: Secondary | ICD-10-CM

## 2014-05-17 DIAGNOSIS — R945 Abnormal results of liver function studies: Secondary | ICD-10-CM

## 2014-05-17 LAB — HEPATIC FUNCTION PANEL
ALT: 59 U/L — AB (ref 0–35)
AST: 50 U/L — ABNORMAL HIGH (ref 0–37)
Albumin: 4.4 g/dL (ref 3.5–5.2)
Alkaline Phosphatase: 47 U/L (ref 39–117)
BILIRUBIN DIRECT: 0.1 mg/dL (ref 0.0–0.3)
Indirect Bilirubin: 0.4 mg/dL (ref 0.2–1.2)
Total Bilirubin: 0.5 mg/dL (ref 0.2–1.2)
Total Protein: 6.7 g/dL (ref 6.0–8.3)

## 2014-05-17 LAB — CBC WITH DIFFERENTIAL/PLATELET
BASOS ABS: 0 10*3/uL (ref 0.0–0.1)
BASOS PCT: 0 % (ref 0–1)
EOS ABS: 0.3 10*3/uL (ref 0.0–0.7)
Eosinophils Relative: 4 % (ref 0–5)
HCT: 40.8 % (ref 36.0–46.0)
Hemoglobin: 13.8 g/dL (ref 12.0–15.0)
Lymphocytes Relative: 40 % (ref 12–46)
Lymphs Abs: 2.7 10*3/uL (ref 0.7–4.0)
MCH: 34.1 pg — AB (ref 26.0–34.0)
MCHC: 33.8 g/dL (ref 30.0–36.0)
MCV: 100.7 fL — ABNORMAL HIGH (ref 78.0–100.0)
MONOS PCT: 7 % (ref 3–12)
Monocytes Absolute: 0.5 10*3/uL (ref 0.1–1.0)
NEUTROS ABS: 3.3 10*3/uL (ref 1.7–7.7)
Neutrophils Relative %: 49 % (ref 43–77)
PLATELETS: 246 10*3/uL (ref 150–400)
RBC: 4.05 MIL/uL (ref 3.87–5.11)
RDW: 13.4 % (ref 11.5–15.5)
WBC: 6.7 10*3/uL (ref 4.0–10.5)

## 2014-05-17 LAB — BASIC METABOLIC PANEL WITH GFR
BUN: 13 mg/dL (ref 6–23)
CALCIUM: 9.1 mg/dL (ref 8.4–10.5)
CO2: 27 mEq/L (ref 19–32)
Chloride: 99 mEq/L (ref 96–112)
Creat: 0.74 mg/dL (ref 0.50–1.10)
GFR, EST NON AFRICAN AMERICAN: 86 mL/min
GLUCOSE: 88 mg/dL (ref 70–99)
Potassium: 4.1 mEq/L (ref 3.5–5.3)
SODIUM: 133 meq/L — AB (ref 135–145)

## 2014-05-17 LAB — MAGNESIUM: MAGNESIUM: 2 mg/dL (ref 1.5–2.5)

## 2014-05-17 MED ORDER — LORAZEPAM 2 MG PO TABS: ORAL_TABLET | ORAL | Status: DC

## 2014-05-17 MED ORDER — ESCITALOPRAM OXALATE 20 MG PO TABS: 20.0000 mg | ORAL_TABLET | Freq: Every day | ORAL | Status: DC

## 2014-05-17 MED ORDER — CYCLOBENZAPRINE HCL 10 MG PO TABS: 10.0000 mg | ORAL_TABLET | Freq: Three times a day (TID) | ORAL | Status: DC | PRN

## 2014-05-17 NOTE — Patient Instructions (Signed)
Insomnia Insomnia is frequent trouble falling and/or staying asleep. Insomnia can be a long term problem or a short term problem. Both are common. Insomnia can be a short term problem when the wakefulness is related to a certain stress or worry. Long term insomnia is often related to ongoing stress during waking hours and/or poor sleeping habits. Overtime, sleep deprivation itself can make the problem worse. Every little thing feels more severe because you are overtired and your ability to cope is decreased. CAUSES   Stress, anxiety, and depression.  Poor sleeping habits.  Distractions such as TV in the bedroom.  Naps close to bedtime.  Engaging in emotionally charged conversations before bed.  Technical reading before sleep.  Alcohol and other sedatives. They may make the problem worse. They can hurt normal sleep patterns and normal dream activity.  Stimulants such as caffeine for several hours prior to bedtime.  Pain syndromes and shortness of breath can cause insomnia.  Exercise late at night.  Changing time zones may cause sleeping problems (jet lag). It is sometimes helpful to have someone observe your sleeping patterns. They should look for periods of not breathing during the night (sleep apnea). They should also look to see how long those periods last. If you live alone or observers are uncertain, you can also be observed at a sleep clinic where your sleep patterns will be professionally monitored. Sleep apnea requires a checkup and treatment. Give your caregivers your medical history. Give your caregivers observations your family has made about your sleep.  SYMPTOMS   Not feeling rested in the morning.  Anxiety and restlessness at bedtime.  Difficulty falling and staying asleep. TREATMENT   Your caregiver may prescribe treatment for an underlying medical disorders. Your caregiver can give advice or help if you are using alcohol or other drugs for self-medication. Treatment  of underlying problems will usually eliminate insomnia problems.  Medications can be prescribed for short time use. They are generally not recommended for lengthy use.  Over-the-counter sleep medicines are not recommended for lengthy use. They can be habit forming.  You can promote easier sleeping by making lifestyle changes such as:  Using relaxation techniques that help with breathing and reduce muscle tension.  Exercising earlier in the day.  Changing your diet and the time of your last meal. No night time snacks.  Establish a regular time to go to bed.  Counseling can help with stressful problems and worry.  Soothing music and white noise may be helpful if there are background noises you cannot remove.  Stop tedious detailed work at least one hour before bedtime. HOME CARE INSTRUCTIONS   Keep a diary. Inform your caregiver about your progress. This includes any medication side effects. See your caregiver regularly. Take note of:  Times when you are asleep.  Times when you are awake during the night.  The quality of your sleep.  How you feel the next day. This information will help your caregiver care for you.  Get out of bed if you are still awake after 15 minutes. Read or do some quiet activity. Keep the lights down. Wait until you feel sleepy and go back to bed.  Keep regular sleeping and waking hours. Avoid naps.  Exercise regularly.  Avoid distractions at bedtime. Distractions include watching television or engaging in any intense or detailed activity like attempting to balance the household checkbook.  Develop a bedtime ritual. Keep a familiar routine of bathing, brushing your teeth, climbing into bed at the same   time each night, listening to soothing music. Routines increase the success of falling to sleep faster.  Use relaxation techniques. This can be using breathing and muscle tension release routines. It can also include visualizing peaceful scenes. You can  also help control troubling or intruding thoughts by keeping your mind occupied with boring or repetitive thoughts like the old concept of counting sheep. You can make it more creative like imagining planting one beautiful flower after another in your backyard garden.  During your day, work to eliminate stress. When this is not possible use some of the previous suggestions to help reduce the anxiety that accompanies stressful situations. MAKE SURE YOU:   Understand these instructions.  Will watch your condition.  Will get help right away if you are not doing well or get worse. Document Released: 09/20/2000 Document Revised: 12/16/2011 Document Reviewed: 10/21/2007 [employer] Patient Information 2015 [employer]. This information is not intended to replace advice given to you by your health care provider. Make sure you discuss any questions you have with your health care provider.  

## 2014-05-17 NOTE — Progress Notes (Signed)
   Subjective:    Patient ID: Breanna Sparks, female    DOB: 1951-08-24, 63 y.o.   MRN: [id number]  HPI 63 y.o. female with history of depression, she is on lexapro and states she is doing much better. Her son is doing better, and has a girlfriend. She denies SI/HI and is very active/doing well. She however is unable to sleep on the trazodone due to dry mouth and would like another refill of the ativan 2mg  to take 1/2-1 tablet as needed. She uses valtrex occ which helps. She has neck pain and uses flexeril PRN.     Review of Systems  Constitutional: Negative.   HENT: Negative.   Respiratory: Negative.   Cardiovascular: Negative.   Gastrointestinal: Negative.   Genitourinary: Negative.   Musculoskeletal: Negative.   Skin: Negative.   Neurological: Negative.   Psychiatric/Behavioral: Positive for sleep disturbance. Negative for suicidal ideas, hallucinations, behavioral problems, confusion, self-injury, dysphoric mood, decreased concentration and agitation. The patient is nervous/anxious. The patient is not hyperactive.        Objective:   Physical Exam  Constitutional: She is oriented to person, place, and time. She appears well-developed and well-nourished.  HENT:  Head: Normocephalic and atraumatic.  Right Ear: External ear normal.  Left Ear: External ear normal.  Mouth/Throat: Oropharynx is clear and moist.  Eyes: Conjunctivae and EOM are normal. Pupils are equal, round, and reactive to light.  Neck: Normal range of motion. Neck supple. No thyromegaly present.  Cardiovascular: Normal rate, regular rhythm and normal heart sounds.  Exam reveals no gallop and no friction rub.   No murmur heard. Pulmonary/Chest: Effort normal and breath sounds normal. No respiratory distress. She has no wheezes.  Abdominal: Soft. Bowel sounds are normal. She exhibits no distension and no mass. There is no tenderness. There is no rebound and no guarding.  Musculoskeletal: Normal range of motion.   Lymphadenopathy:    She has no cervical adenopathy.  Neurological: She is alert and oriented to person, place, and time. She displays normal reflexes. No cranial nerve deficit. Coordination normal.  Skin: Skin is warm and dry.  Psychiatric: She has a normal mood and affect.      Assessment & Plan:  Depression/Anxiety- continue medications, will refill lexapro, stress management techniques discussed, increase water, good sleep hygiene discussed, increase exercise, and increase veggies. Insomnia will do ativan 2mg  1 at night Neck pain- flexeril 10mg  refill.   Elevated LFTs- check LFTs, hepatitis

## 2014-05-18 LAB — HEPATITIS A ANTIBODY, TOTAL: HEP A TOTAL AB: NONREACTIVE

## 2014-05-18 LAB — HEPATITIS C ANTIBODY: HCV Ab: NEGATIVE

## 2014-05-18 LAB — HEPATITIS B CORE ANTIBODY, TOTAL: HEP B C TOTAL AB: NONREACTIVE

## 2014-05-18 LAB — HEPATITIS B SURFACE ANTIBODY,QUALITATIVE: HEP B S AB: NEGATIVE

## 2014-05-19 LAB — HEPATITIS B E ANTIBODY: Hepatitis Be Antibody: NONREACTIVE

## 2014-07-06 ENCOUNTER — Ambulatory Visit (INDEPENDENT_AMBULATORY_CARE_PROVIDER_SITE_OTHER): Payer: BC Managed Care – PPO | Admitting: *Deleted

## 2014-07-06 DIAGNOSIS — Z23 Encounter for immunization: Secondary | ICD-10-CM

## 2014-09-07 ENCOUNTER — Ambulatory Visit: Payer: Self-pay | Admitting: Physician Assistant

## 2014-09-26 ENCOUNTER — Ambulatory Visit (INDEPENDENT_AMBULATORY_CARE_PROVIDER_SITE_OTHER): Payer: BC Managed Care – PPO | Admitting: Physician Assistant

## 2014-09-26 ENCOUNTER — Encounter: Payer: Self-pay | Admitting: Physician Assistant

## 2014-09-26 VITALS — BP 120/78 | HR 80 | Temp 98.7°F | Resp 16 | Ht 65.0 in | Wt 125.0 lb

## 2014-09-26 DIAGNOSIS — Z91048 Other nonmedicinal substance allergy status: Secondary | ICD-10-CM

## 2014-09-26 DIAGNOSIS — R7989 Other specified abnormal findings of blood chemistry: Secondary | ICD-10-CM

## 2014-09-26 DIAGNOSIS — D649 Anemia, unspecified: Secondary | ICD-10-CM

## 2014-09-26 DIAGNOSIS — R58 Hemorrhage, not elsewhere classified: Secondary | ICD-10-CM

## 2014-09-26 DIAGNOSIS — H109 Unspecified conjunctivitis: Secondary | ICD-10-CM

## 2014-09-26 DIAGNOSIS — R945 Abnormal results of liver function studies: Secondary | ICD-10-CM

## 2014-09-26 DIAGNOSIS — Z9109 Other allergy status, other than to drugs and biological substances: Secondary | ICD-10-CM

## 2014-09-26 MED ORDER — NEOMYCIN-POLYMYXIN-DEXAMETH 0.1 % OP SUSP: 1.0000 [drp] | Freq: Three times a day (TID) | OPHTHALMIC | Status: DC

## 2014-09-26 MED ORDER — DEXAMETHASONE SODIUM PHOSPHATE 100 MG/10ML IJ SOLN
10.0000 mg | Freq: Once | INTRAMUSCULAR | Status: AC
  Administered 2014-09-26: 10 mg via INTRAMUSCULAR

## 2014-09-26 NOTE — Progress Notes (Signed)
Subjective:    Patient ID: Breanna Sparks, female    DOB: Feb 04, 1951, 63 y.o.   MRN: [id number]  Conjunctivitis  Episode onset: 4 weeks, does not wear contacts.  The problem has been gradually worsening. The problem is moderate. Relieved by: allergy pill. Associated symptoms include eye itching, congestion, rhinorrhea and eye redness. Pertinent negatives include no fever, no decreased vision, no double vision, no photophobia, no ear discharge, no ear pain, no headaches, no hearing loss, no mouth sores, no sore throat, no stridor, no swollen glands, no eye discharge and no eye pain. Both eyes are affected.There were no sick contacts. 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   Also complains that her hair is falling out, has been worse since taking prilosec. States that she bruises easily.   Past Medical History  Diagnosis Date  . Depression   . Anxiety   . Glaucoma   . Arthritis   . History of squamous cell carcinoma   . TMJ (temporomandibular joint syndrome)     wears gaurd at night  . GERD (gastroesophageal reflux disease)    Current Outpatient Prescriptions on File Prior to Visit  Medication Sig Dispense Refill  . Cholecalciferol (VITAMIN D PO) Take 1,000 Int'l Units by mouth daily.    . Cyanocobalamin (VITAMIN B-12 PO) Take by mouth daily.    . cyclobenzaprine (FLEXERIL) 10 MG tablet Take 1 tablet (10 mg total) by mouth 3 (three) times daily as needed for muscle spasms. 90 tablet 1  . escitalopram (LEXAPRO) 20 MG tablet Take 1 tablet (20 mg total) by mouth daily. 90 tablet 1  . LORazepam (ATIVAN) 2 MG tablet TAKE ONE-HALF TO ONE TABLET BY MOUTH AT BEDTIME AS NEEDED 90 tablet 1  . omeprazole (PRILOSEC) 40 MG capsule Take 1 capsule (40 mg total) by mouth daily. 90 capsule 1   No current facility-administered medications on file prior to visit.     Review of Systems  Constitutional: Negative for fever.  HENT: Positive for congestion,  postnasal drip, rhinorrhea, sinus pressure and sneezing. Negative for ear discharge, ear pain, facial swelling, hearing loss, mouth sores, nosebleeds, sore throat, tinnitus, trouble swallowing and voice change.   Eyes: Positive for redness and itching. Negative for double vision, photophobia, pain and discharge.  Respiratory: Negative.  Negative for stridor.   Cardiovascular: Negative.   Gastrointestinal: Negative.   Endocrine: Negative.   Genitourinary: Negative.   Allergic/Immunologic: Positive for environmental allergies. Negative for food allergies and immunocompromised state.  Neurological: Negative.  Negative for headaches.  Hematological: Negative for adenopathy. Bruises/bleeds easily.  Psychiatric/Behavioral: Negative.        Objective:   Physical Exam  Constitutional: She is oriented to person, place, and time. She appears well-developed and well-nourished.  HENT:  Head: Normocephalic and atraumatic.  Right Ear: External ear normal.  Left Ear: External ear normal.  Mouth/Throat: Oropharynx is clear and moist.  Eyes: EOM are normal. Pupils are equal, round, and reactive to light. Lids are everted and swept, no foreign bodies found. Right eye exhibits discharge. Right eye exhibits no hordeolum. No foreign body present in the right eye. Left eye exhibits no discharge and no hordeolum. No foreign body present in the left eye. Right conjunctiva is injected. Left conjunctiva is injected.  Neck: Normal range of motion. Neck supple. No thyromegaly present.  Cardiovascular: Normal rate, regular rhythm and normal heart sounds.  Exam reveals no gallop and no friction rub.   No murmur heard.  Pulmonary/Chest: Effort normal and breath sounds normal. No respiratory distress. She has no wheezes.  Abdominal: Soft. Bowel sounds are normal. She exhibits no distension and no mass. There is no tenderness. There is no rebound and no guarding.  Musculoskeletal: Normal range of motion.   Lymphadenopathy:    She has no cervical adenopathy.  Neurological: She is alert and oriented to person, place, and time. She displays normal reflexes. No cranial nerve deficit. Coordination normal.  Skin: Skin is warm and dry.  Psychiatric: She has a normal mood and affect.       Assessment & Plan:  1. Bilateral conjunctivitis Will also contact her eye doctor to see if it is okay to take maxitrol due to glacoma - neomycin-polymyxin-dexamethasone (MAXITROL) 0.1 % ophthalmic suspension; Place 1 drop into both eyes 3 (three) times daily.  Dispense: 5 mL; Refill: 0  2. Environmental allergies - dexamethasone (DECADRON) injection 10 mg; Inject 1 mL (10 mg total) into the muscle once.  3. Elevated liver function tests - Hepatic function panel - BASIC METABOLIC PANEL WITH GFR  5. Anemia, unspecified anemia type Check labs, since on prilosec - CBC with Differential - TSH - Vitamin B12 - Iron and TIBC - Ferritin - Folate RBC

## 2014-09-26 NOTE — Patient Instructions (Addendum)
Contact eye doctor to see if okay to take the eye drops  Allergic Conjunctivitis The conjunctiva is a thin membrane that covers the visible white part of the eyeball and the underside of the eyelids. This membrane protects and lubricates the eye. The membrane has small blood vessels running through it that can normally be seen. When the conjunctiva becomes inflamed, the condition is called conjunctivitis. In response to the inflammation, the conjunctival blood vessels become swollen. The swelling results in redness in the normally white part of the eye. The blood vessels of this membrane also react when a person has allergies and is then called allergic conjunctivitis. This condition usually lasts for as long as the allergy persists. Allergic conjunctivitis cannot be passed to another person (non-contagious). The likelihood of bacterial infection is great and the cause is not likely due to allergies if the inflamed eye has:  A sticky discharge.  Discharge or sticking together of the lids in the morning.  Scaling or flaking of the eyelids where the eyelashes come out.  Red swollen eyelids. CAUSES   Viruses.  Irritants such as foreign bodies.  Chemicals.  General allergic reactions.  Inflammation or serious diseases in the inside or the outside of the eye or the orbit (the boney cavity in which the eye sits) can cause a "red eye." SYMPTOMS   Eye redness.  Tearing.  Itchy eyes.  Burning feeling in the eyes.  Clear drainage from the eye.  Allergic reaction due to pollens or ragweed sensitivity. Seasonal allergic conjunctivitis is frequent in the spring when pollens are in the air and in the fall. DIAGNOSIS  This condition, in its many forms, is usually diagnosed based on the history and an ophthalmological exam. It usually involves both eyes. If your eyes react at the same time every year, allergies may be the cause. While most "red eyes" are due to allergy or an infection, the role  of an eye (ophthalmological) exam is important. The exam can rule out serious diseases of the eye or orbit. TREATMENT   Non-antibiotic eye drops, ointments, or medications by mouth may be prescribed if the ophthalmologist is sure the conjunctivitis is due to allergies alone.  Over-the-counter drops and ointments for allergic symptoms should be used only after other causes of conjunctivitis have been ruled out, or as your caregiver suggests. Medications by mouth are often prescribed if other allergy-related symptoms are present. If the ophthalmologist is sure that the conjunctivitis is due to allergies alone, treatment is normally limited to drops or ointments to reduce itching and burning. HOME CARE INSTRUCTIONS   Wash hands before and after applying drops or ointments, or touching the inflamed eye(s) or eyelids.  Do not let the eye dropper tip or ointment tube touch the eyelid when putting medicine in your eye.  Stop using your soft contact lenses and throw them away. Use a new pair of lenses when recovery is complete. You should run through sterilizing cycles at least three times before use after complete recovery if the old soft contact lenses are to be used. Hard contact lenses should be stopped. They need to be thoroughly sterilized before use after recovery.  Itching and burning eyes due to allergies is often relieved by using a cool cloth applied to closed eye(s). SEEK MEDICAL CARE IF:   Your problems do not go away after two or three days of treatment.  Your lids are sticky (especially in the morning when you wake up) or stick together.  Discharge develops.  Antibiotics may be needed either as drops, ointment, or by mouth.  You have extreme light sensitivity.  An oral temperature above 102 F (38.9 C) develops.  Pain in or around the eye or any other visual symptom develops. MAKE SURE YOU:   Understand these instructions.  Will watch your condition.  Will get help right  away if you are not doing well or get worse. Document Released: 12/14/2002 Document Revised: 12/16/2011 Document Reviewed: 11/09/2007 [facility] Patient Information 2015 [place], [place]. This information is not intended to replace advice given to you by your health care provider. Make sure you discuss any questions you have with your health care provider.

## 2014-09-27 ENCOUNTER — Other Ambulatory Visit: Payer: Self-pay | Admitting: Physician Assistant

## 2014-09-27 DIAGNOSIS — Z1231 Encounter for screening mammogram for malignant neoplasm of breast: Secondary | ICD-10-CM

## 2014-09-27 LAB — CBC WITH DIFFERENTIAL/PLATELET
BASOS PCT: 0 % (ref 0–1)
Basophils Absolute: 0 10*3/uL (ref 0.0–0.1)
EOS ABS: 0.3 10*3/uL (ref 0.0–0.7)
EOS PCT: 5 % (ref 0–5)
HCT: 38.8 % (ref 36.0–46.0)
Hemoglobin: 13.1 g/dL (ref 12.0–15.0)
Lymphocytes Relative: 36 % (ref 12–46)
Lymphs Abs: 2.2 10*3/uL (ref 0.7–4.0)
MCH: 34.5 pg — AB (ref 26.0–34.0)
MCHC: 33.8 g/dL (ref 30.0–36.0)
MCV: 102.1 fL — ABNORMAL HIGH (ref 78.0–100.0)
MPV: 8.6 fL — AB (ref 9.4–12.4)
Monocytes Absolute: 0.5 10*3/uL (ref 0.1–1.0)
Monocytes Relative: 8 % (ref 3–12)
Neutro Abs: 3.2 10*3/uL (ref 1.7–7.7)
Neutrophils Relative %: 51 % (ref 43–77)
Platelets: 253 10*3/uL (ref 150–400)
RBC: 3.8 MIL/uL — ABNORMAL LOW (ref 3.87–5.11)
RDW: 12.7 % (ref 11.5–15.5)
WBC: 6.2 10*3/uL (ref 4.0–10.5)

## 2014-09-27 LAB — HEPATIC FUNCTION PANEL
ALBUMIN: 4.3 g/dL (ref 3.5–5.2)
ALT: 46 U/L — AB (ref 0–35)
AST: 39 U/L — AB (ref 0–37)
Alkaline Phosphatase: 43 U/L (ref 39–117)
BILIRUBIN DIRECT: 0.1 mg/dL (ref 0.0–0.3)
Indirect Bilirubin: 0.4 mg/dL (ref 0.2–1.2)
Total Bilirubin: 0.5 mg/dL (ref 0.2–1.2)
Total Protein: 6.4 g/dL (ref 6.0–8.3)

## 2014-09-27 LAB — IRON AND TIBC
%SAT: 47 % (ref 20–55)
Iron: 171 ug/dL — ABNORMAL HIGH (ref 42–145)
TIBC: 363 ug/dL (ref 250–470)
UIBC: 192 ug/dL (ref 125–400)

## 2014-09-27 LAB — BASIC METABOLIC PANEL WITH GFR
BUN: 13 mg/dL (ref 6–23)
CALCIUM: 9.1 mg/dL (ref 8.4–10.5)
CO2: 27 mEq/L (ref 19–32)
CREATININE: 0.68 mg/dL (ref 0.50–1.10)
Chloride: 105 mEq/L (ref 96–112)
GFR, Est Non African American: >89 mL/min
GLUCOSE: 80 mg/dL (ref 70–99)
Potassium: 3.8 mEq/L (ref 3.5–5.3)
SODIUM: 139 meq/L (ref 135–145)

## 2014-09-27 LAB — FERRITIN: Ferritin: 68 ng/mL (ref 10–291)

## 2014-09-27 LAB — FOLATE RBC: RBC FOLATE: 774 ng/mL (ref >280)

## 2014-09-27 LAB — VITAMIN B12: VITAMIN B 12: 695 pg/mL (ref 211–911)

## 2014-09-27 LAB — TSH: TSH: 4.276 u[IU]/mL (ref 0.350–4.500)

## 2014-09-28 ENCOUNTER — Telehealth: Payer: Self-pay

## 2014-09-28 NOTE — Telephone Encounter (Signed)
Patient called for lab results as she was having problems with her MYChart , patient aware of results and instructions

## 2014-10-06 ENCOUNTER — Ambulatory Visit (HOSPITAL_COMMUNITY)
Admission: RE | Admit: 2014-10-06 | Discharge: 2014-10-06 | Disposition: A | Discharge status: Home / Self Care | Payer: BC Managed Care – PPO | Source: Ambulatory Visit | Attending: Physician Assistant | Admitting: Physician Assistant

## 2014-10-06 DIAGNOSIS — Z1231 Encounter for screening mammogram for malignant neoplasm of breast: Secondary | ICD-10-CM | POA: Insufficient documentation

## 2014-10-19 ENCOUNTER — Other Ambulatory Visit: Payer: Self-pay | Admitting: Physician Assistant

## 2014-10-20 ENCOUNTER — Other Ambulatory Visit: Payer: Self-pay | Admitting: Physician Assistant

## 2014-11-16 ENCOUNTER — Encounter: Payer: Self-pay | Admitting: Physician Assistant

## 2015-01-09 ENCOUNTER — Ambulatory Visit (INDEPENDENT_AMBULATORY_CARE_PROVIDER_SITE_OTHER): Payer: BLUE CROSS/BLUE SHIELD | Admitting: Physician Assistant

## 2015-01-09 ENCOUNTER — Encounter: Payer: Self-pay | Admitting: Physician Assistant

## 2015-01-09 VITALS — BP 116/70 | HR 72 | Temp 98.2°F | Resp 18 | Ht 66.0 in | Wt 122.0 lb

## 2015-01-09 DIAGNOSIS — M545 Low back pain, unspecified: Secondary | ICD-10-CM | POA: Insufficient documentation

## 2015-01-09 DIAGNOSIS — F329 Major depressive disorder, single episode, unspecified: Secondary | ICD-10-CM

## 2015-01-09 DIAGNOSIS — Z79899 Other long term (current) drug therapy: Secondary | ICD-10-CM

## 2015-01-09 DIAGNOSIS — H409 Unspecified glaucoma: Secondary | ICD-10-CM

## 2015-01-09 DIAGNOSIS — R6889 Other general symptoms and signs: Secondary | ICD-10-CM

## 2015-01-09 DIAGNOSIS — G4700 Insomnia, unspecified: Secondary | ICD-10-CM | POA: Insufficient documentation

## 2015-01-09 DIAGNOSIS — Z8589 Personal history of malignant neoplasm of other organs and systems: Secondary | ICD-10-CM

## 2015-01-09 DIAGNOSIS — E2839 Other primary ovarian failure: Secondary | ICD-10-CM

## 2015-01-09 DIAGNOSIS — F32A Depression, unspecified: Secondary | ICD-10-CM

## 2015-01-09 DIAGNOSIS — B009 Herpesviral infection, unspecified: Secondary | ICD-10-CM

## 2015-01-09 DIAGNOSIS — E559 Vitamin D deficiency, unspecified: Secondary | ICD-10-CM

## 2015-01-09 DIAGNOSIS — Z0001 Encounter for general adult medical examination with abnormal findings: Secondary | ICD-10-CM

## 2015-01-09 DIAGNOSIS — R945 Abnormal results of liver function studies: Secondary | ICD-10-CM

## 2015-01-09 DIAGNOSIS — K219 Gastro-esophageal reflux disease without esophagitis: Secondary | ICD-10-CM

## 2015-01-09 DIAGNOSIS — F419 Anxiety disorder, unspecified: Secondary | ICD-10-CM

## 2015-01-09 DIAGNOSIS — M1990 Unspecified osteoarthritis, unspecified site: Secondary | ICD-10-CM

## 2015-01-09 DIAGNOSIS — R7989 Other specified abnormal findings of blood chemistry: Secondary | ICD-10-CM

## 2015-01-09 LAB — LIPID PANEL
Cholesterol: 234 mg/dL — ABNORMAL HIGH (ref 0–200)
HDL: 100 mg/dL (ref >=46)
LDL CALC: 114 mg/dL — AB (ref 0–99)
Total CHOL/HDL Ratio: 2.3 Ratio
Triglycerides: 98 mg/dL (ref <150)
VLDL: 20 mg/dL (ref 0–40)

## 2015-01-09 LAB — CBC WITH DIFFERENTIAL/PLATELET
BASOS ABS: 0 10*3/uL (ref 0.0–0.1)
BASOS PCT: 0 % (ref 0–1)
Eosinophils Absolute: 0.2 10*3/uL (ref 0.0–0.7)
Eosinophils Relative: 4 % (ref 0–5)
HEMATOCRIT: 39.3 % (ref 36.0–46.0)
HEMOGLOBIN: 13.2 g/dL (ref 12.0–15.0)
LYMPHS ABS: 2.5 10*3/uL (ref 0.7–4.0)
Lymphocytes Relative: 42 % (ref 12–46)
MCH: 34.7 pg — AB (ref 26.0–34.0)
MCHC: 33.6 g/dL (ref 30.0–36.0)
MCV: 103.4 fL — ABNORMAL HIGH (ref 78.0–100.0)
MONOS PCT: 9 % (ref 3–12)
MPV: 8.6 fL (ref 8.6–12.4)
Monocytes Absolute: 0.5 10*3/uL (ref 0.1–1.0)
Neutro Abs: 2.7 10*3/uL (ref 1.7–7.7)
Neutrophils Relative %: 45 % (ref 43–77)
Platelets: 225 10*3/uL (ref 150–400)
RBC: 3.8 MIL/uL — ABNORMAL LOW (ref 3.87–5.11)
RDW: 13.2 % (ref 11.5–15.5)
WBC: 5.9 10*3/uL (ref 4.0–10.5)

## 2015-01-09 LAB — BASIC METABOLIC PANEL WITH GFR
BUN: 14 mg/dL (ref 6–23)
CHLORIDE: 104 meq/L (ref 96–112)
CO2: 26 meq/L (ref 19–32)
Calcium: 8.9 mg/dL (ref 8.4–10.5)
Creat: 0.69 mg/dL (ref 0.50–1.10)
GFR, Est African American: >89 mL/min
GFR, Est Non African American: >89 mL/min
Glucose, Bld: 82 mg/dL (ref 70–99)
Potassium: 4.2 mEq/L (ref 3.5–5.3)
SODIUM: 137 meq/L (ref 135–145)

## 2015-01-09 LAB — HEPATIC FUNCTION PANEL
ALK PHOS: 41 U/L (ref 39–117)
ALT: 35 U/L (ref 0–35)
AST: 42 U/L — ABNORMAL HIGH (ref 0–37)
Albumin: 4.2 g/dL (ref 3.5–5.2)
BILIRUBIN INDIRECT: 0.4 mg/dL (ref 0.2–1.2)
BILIRUBIN TOTAL: 0.5 mg/dL (ref 0.2–1.2)
Bilirubin, Direct: 0.1 mg/dL (ref 0.0–0.3)
TOTAL PROTEIN: 6.4 g/dL (ref 6.0–8.3)

## 2015-01-09 LAB — MAGNESIUM: Magnesium: 1.8 mg/dL (ref 1.5–2.5)

## 2015-01-09 MED ORDER — RANITIDINE HCL 300 MG PO TABS: ORAL_TABLET | ORAL | Status: DC

## 2015-01-09 MED ORDER — CYCLOBENZAPRINE HCL 10 MG PO TABS: 10.0000 mg | ORAL_TABLET | Freq: Three times a day (TID) | ORAL | Status: DC | PRN

## 2015-01-09 MED ORDER — LORAZEPAM 2 MG PO TABS: ORAL_TABLET | ORAL | Status: DC

## 2015-01-09 MED ORDER — VALACYCLOVIR HCL 1 G PO TABS: 1000.0000 mg | ORAL_TABLET | Freq: Every day | ORAL | Status: DC

## 2015-01-09 MED ORDER — ESCITALOPRAM OXALATE 20 MG PO TABS: 20.0000 mg | ORAL_TABLET | Freq: Every day | ORAL | Status: DC

## 2015-01-09 NOTE — Progress Notes (Signed)
Complete Physical  Assessment and Plan: 1. Depression - escitalopram (LEXAPRO) 20 MG tablet; Take 1 tablet (20 mg total) by mouth daily.  Dispense: 90 tablet; Refill: 1  2. Anxiety - escitalopram (LEXAPRO) 20 MG tablet; Take 1 tablet (20 mg total) by mouth daily.  Dispense: 90 tablet; Refill: 1 - TSH  3. History of squamous cell carcinoma Cont follow up  4. Arthritis RICE, NSAIDS, exercises given, if not better get xray and PT referral or ortho referral.   5. Glaucoma Cont follow up  6. Elevated liver function tests - Lipid panel  7. Bilateral low back pain without sciatica - cyclobenzaprine (FLEXERIL) 10 MG tablet; Take 1 tablet (10 mg total) by mouth 3 (three) times daily as needed for muscle spasms.  Dispense: 90 tablet; Refill: 1 - Urinalysis, Routine w reflex microscopic - Microalbumin / creatinine urine ratio  8. Insomnia - LORazepam (ATIVAN) 2 MG tablet; TAKE ONE-HALF TO ONE TABLET BY MOUTH AT BEDTIME AS NEEDED  Dispense: 90 tablet; Refill: 1  9. HSV-1 (herpes simplex virus 1) infection - valACYclovir (VALTREX) 1000 MG tablet; Take 1 tablet (1,000 mg total) by mouth daily.  Dispense: 30 tablet; Refill: 2  10. Gastroesophageal reflux disease without esophagitis GERD- will try to get off PPiI given info for taper and zantac sent in - ranitidine (ZANTAC) 300 MG tablet; Take twice a day while trying to get off PPI, then can go to once at night  Dispense: 60 tablet; Refill: 3  11. Medication management - CBC with Differential/Platelet - Hepatic function panel - BASIC METABOLIC PANEL WITH GFR - Magnesium  12. Vitamin D deficiency - Vit D  25 hydroxy (rtn osteoporosis monitoring)  13. Estrogen deficiency With [facility] - DG Bone Density; Future  Discussed med's effects and SE's. Screening labs and tests as requested with regular follow-up as recommended. Future Appointments Date Time Provider [place]  01/10/2016 3:00 PM Vicie Mutters, PA-C [id number]  None    HPI 64 y.o. female  presents for a complete physical. Her blood pressure has been controlled at home, today their BP is BP: 116/70 mmHg She denies chest pain, shortness of breath, dizziness.  Her cholesterol is diet controlled.  Her cholesterol is controlled. The cholesterol last visit was:   Lab Results  Component Value Date   CHOL 214* 11/16/2013   HDL 85 11/16/2013   LDLCALC 106* 11/16/2013   TRIG 113 11/16/2013   CHOLHDL 2.5 11/16/2013  Patient is on Vitamin D supplement.  Lab Results  Component Value Date   VD25OH 95* 11/16/2013  She has had a normal A1C.  Lab Results  Component Value Date   HGBA1C 5.1 11/16/2013  History of SCC, follows with DERM q 3 months.  She has glacoma and follows with her eye doctor every 3 months.  Patient is on lexapro 20 and states she is doing well with it.  Takes ativan at night for sleep.  She is on estrogen/progresterone cream, gets a combo from [place].  She has history of elevated AST/ALT, normal hepatitis 05/2014.  Lab Results  Component Value Date   ALT 46* 09/26/2014   AST 39* 09/26/2014   ALKPHOS 43 09/26/2014   BILITOT 0.5 09/26/2014    Current Medications:  Current Outpatient Prescriptions on File Prior to Visit  Medication Sig Dispense Refill  . Cholecalciferol (VITAMIN D PO) Take 1,000 Int'l Units by mouth daily.    . Cyanocobalamin (VITAMIN B-12 PO) Take by mouth daily.    . cyclobenzaprine (FLEXERIL)  10 MG tablet Take 1 tablet (10 mg total) by mouth 3 (three) times daily as needed for muscle spasms. 90 tablet 1  . escitalopram (LEXAPRO) 20 MG tablet Take 1 tablet (20 mg total) by mouth daily. 90 tablet 1  . LORazepam (ATIVAN) 2 MG tablet TAKE ONE-HALF TO ONE TABLET BY MOUTH AT BEDTIME AS NEEDED 90 tablet 1  . neomycin-polymyxin b-dexamethasone (MAXITROL) 3.5-10000-0.1 SUSP INSTILL ONE DROP INTO EACH EYE THREE TIMES DAILY 5 mL 1  . omeprazole (PRILOSEC) 40 MG capsule Take 1 capsule (40 mg total) by mouth daily. 90  capsule 1   No current facility-administered medications on file prior to visit.   Immunization History  Administered Date(s) Administered  . Influenza Split 07/02/2013, 07/06/2014  . Pneumococcal Polysaccharide-23 02/03/2014  . Tdap 11/13/2012  . Zoster 11/16/2013   Health Maintenance:  Tetanus: 2014 Pneumovax: 2015 Prevnar 13: due at 66 Flu vaccine: 06/2014 Zostavax: 2015 Pap: TAH not needed MGM: 09/2014 neg DEXA: ? Small frame, no history of recent broken bones, early menopause (age 87) currently on estrogen/progesterone cream, not smoker, sister with osteoporosis (5 years younger but she is drug user) . Will get 2016 WITH MGM, per patient request Colonoscopy: 03/2008- repeat 10 years Dr. Olevia Perches EGD: 03/2008   Allergies:  Allergies  Allergen Reactions  . Codeine     REACTION: Itch/nausea/vomiting  . Meperidine Hcl     REACTION: itch  . Morphine     REACTION: tachycardia   Medical History:  Past Medical History  Diagnosis Date  . Depression   . Anxiety   . Glaucoma   . Arthritis   . History of squamous cell carcinoma   . TMJ (temporomandibular joint syndrome)     wears gaurd at night  . GERD (gastroesophageal reflux disease)    Surgical History:  Past Surgical History  Procedure Laterality Date  . Abdominal hysterectomy      age 9  . Breast surgery      implants  . Cervical fusion  2012    Dr. Ellene Route  . Squamous cell carcinoma excision      Multiple  . Anterior cruciate ligament repair Bilateral 1998   Family History:  Family History  Problem Relation Age of Onset  . Hypertension Mother   . Diabetes Mother   . Heart disease Mother   . Glaucoma Mother   . Heart attack Father    Social History:  History   Social History  . Marital Status: Married    Spouse Name: N/A  . Number of Children: N/A  . Years of Education: N/A   Occupational History  . Not on file.   Social History Main Topics  . Smoking status: Never Smoker   . Smokeless  tobacco: Not on file  . Alcohol Use: Yes     Comment: Occasion  . Drug Use: No  . Sexual Activity: Not on file   Other Topics Concern  . Not on file   Social History Narrative   Review of Systems  Constitutional: Negative.   HENT: Negative.   Eyes: Negative.   Respiratory: Negative.   Cardiovascular: Negative.   Gastrointestinal: Negative.   Genitourinary: Negative.   Musculoskeletal: Negative.   Skin: Negative.   Neurological: Negative.   Endo/Heme/Allergies: Negative.   Psychiatric/Behavioral: Negative.     Physical Exam: Estimated body mass index is 19.7 kg/(m^2) as calculated from the following:   Height as of this encounter: 5\' 6"  (1.676 m).   Weight as of this encounter:  122 lb (55.339 kg). Filed Vitals:   01/09/15 1515  BP: 116/70  Pulse: 72  Temp: 98.2 F (36.8 C)  Resp: 18   General Appearance: Well nourished, in no apparent distress. Eyes: PERRLA, EOMs, conjunctiva no swelling or erythema, normal fundi and vessels. Sinuses: No Frontal/maxillary tenderness ENT/Mouth: Ext aud canals clear, normal light reflex with TMs without erythema, bulging.  Good dentition. No erythema, swelling, or exudate on post pharynx. Tonsils not swollen or erythematous. Hearing normal.  Neck: Supple, thyroid normal. No bruits Respiratory: Respiratory effort normal, BS equal bilaterally without rales, rhonchi, wheezing or stridor. Cardio: RRR without murmurs, rubs or gallops. Brisk peripheral pulses without edema.  Chest: symmetric, with normal excursions and percussion. Breasts: Symmetric, without lumps, nipple discharge, retractions. + bilateral implants Abdomen: Soft, +BS. Non tender, no guarding, rebound, hernias, masses, or organomegaly. .  Lymphatics: Non tender without lymphadenopathy.  Genitourinary: defer Musculoskeletal: Full ROM all peripheral extremities,5/5 strength, and normal gait.  Skin: Warm, dry without rashes, lesions, ecchymosis.  Neuro: Cranial nerves intact,  reflexes equal bilaterally. Normal muscle tone, no cerebellar symptoms. Sensation intact.  Psych: Awake and oriented X 3, normal affect, Insight and Judgment appropriate.    Vicie Mutters 3:26 PM

## 2015-01-09 NOTE — Patient Instructions (Addendum)
Nexium/protonix/prilosec are called PPI's, they are great at healing your stomach but should only be taken for a short period of time.   Studies are showing that taken for a long time it can increase the risk of osteoporosis (weakening of your bones), pneumonia, low magnesium, restless legs, Cdiff (infection that causes diarrhea), and most recently kidney disease/insufficiency.  Due to this information we want to try to stop the PPI but if you try to stop it abruptly this can cause rebound acid and worsening symptoms.   So this is how we want you to get off the PPI: - Start taking the nexium/protonix/prilosec/PPI  every other day with pepcid or zantac (generic is fine) 2 x a day for 2 weeks - then decrease the PPI to every 3 days while taking the zantac or pepcid twice a day the other 2 days for 2 weeks - then you can try the zantac or pepcid once at night for 2 weeks - you can continue on this once at night or stop all together - Avoid alcohol, spicy foods, NSAIDS (aleve, ibuprofen) at this time. See foods below.   Food Choices for Gastroesophageal Reflux Disease When you have gastroesophageal reflux disease (GERD), the foods you eat and your eating habits are very important. Choosing the right foods can help ease the discomfort of GERD. WHAT GENERAL GUIDELINES DO I NEED TO FOLLOW?  Choose fruits, vegetables, whole grains, low-fat dairy products, and low-fat meat, fish, and poultry.  Limit fats such as oils, salad dressings, butter, nuts, and avocado.  Keep a food diary to identify foods that cause symptoms.  Avoid foods that cause reflux. These may be different for different people.  Eat frequent small meals instead of three large meals each day.  Eat your meals slowly, in a relaxed setting.  Limit fried foods.  Cook foods using methods other than frying.  Avoid drinking alcohol.  Avoid drinking large amounts of liquids with your meals.  Avoid bending over or lying down until  2-3 hours after eating. WHAT FOODS ARE NOT RECOMMENDED? The following are some foods and drinks that may worsen your symptoms: Vegetables Tomatoes. Tomato juice. Tomato and spaghetti sauce. Chili peppers. Onion and garlic. Horseradish. Fruits Oranges, grapefruit, and lemon (fruit and juice). Meats High-fat meats, fish, and poultry. This includes hot dogs, ribs, ham, sausage, salami, and bacon. Dairy Whole milk and chocolate milk. Sour cream. Cream. Butter. Ice cream. Cream cheese.  Beverages Coffee and tea, with or without caffeine. Carbonated beverages or energy drinks. Condiments Hot sauce. Barbecue sauce.  Sweets/Desserts Chocolate and cocoa. Donuts. Peppermint and spearmint. Fats and Oils High-fat foods, including [place] fries and potato chips. Other Vinegar. Strong spices, such as black pepper, white pepper, red pepper, cayenne, curry powder, cloves, ginger, and chili powder. Nexium/protonix/prilosec are called PPI's, they are great at healing your stomach but should only be taken for a short period of time.   Preventive Care for Adults A healthy lifestyle and preventive care can promote health and wellness. Preventive health guidelines for women include the following key practices.  A routine yearly physical is a good way to check with your health care provider about your health and preventive screening. It is a chance to share any concerns and updates on your health and to receive a thorough exam.  Visit your dentist for a routine exam and preventive care every 6 months. Brush your teeth twice a day and floss once a day. Good oral hygiene prevents tooth decay and gum disease.  The frequency of eye exams is based on your age, health, family medical history, use of contact lenses, and other factors. Follow your health care provider's recommendations for frequency of eye exams.  Eat a healthy diet. Foods like vegetables, fruits, whole grains, low-fat dairy products, and lean  protein foods contain the nutrients you need without too many calories. Decrease your intake of foods high in solid fats, added sugars, and salt. Eat the right amount of calories for you.Get information about a proper diet from your health care provider, if necessary.  Regular physical exercise is one of the most important things you can do for your health. Most adults should get at least 150 minutes of moderate-intensity exercise (any activity that increases your heart rate and causes you to sweat) each week. In addition, most adults need muscle-strengthening exercises on 2 or more days a week.  Maintain a healthy weight. The body mass index (BMI) is a screening tool to identify possible weight problems. It provides an estimate of body fat based on height and weight. Your health care provider can find your BMI and can help you achieve or maintain a healthy weight.For adults 20 years and older:  A BMI below 18.5 is considered underweight.  A BMI of 18.5 to 24.9 is normal.  A BMI of 25 to 29.9 is considered overweight.  A BMI of 30 and above is considered obese.  Maintain normal blood lipids and cholesterol levels by exercising and minimizing your intake of saturated fat. Eat a balanced diet with plenty of fruit and vegetables. If your lipid or cholesterol levels are high, you are over 50, or you are at high risk for heart disease, you may need your cholesterol levels checked more frequently.Ongoing high lipid and cholesterol levels should be treated with medicines if diet and exercise are not working.  If you smoke, find out from your health care provider how to quit. If you do not use tobacco, do not start.  Lung cancer screening is recommended for adults aged 67-80 years who are at high risk for developing lung cancer because of a history of smoking. A yearly low-dose CT scan of the lungs is recommended for people who have at least a 30-pack-year history of smoking and are a current smoker or  have quit within the past 15 years. A pack year of smoking is smoking an average of 1 pack of cigarettes a day for 1 year (for example: 1 pack a day for 30 years or 2 packs a day for 15 years). Yearly screening should continue until the smoker has stopped smoking for at least 15 years. Yearly screening should be stopped for people who develop a health problem that would prevent them from having lung cancer treatment.  Avoid use of street drugs. Do not share needles with anyone. Ask for help if you need support or instructions about stopping the use of drugs.  High blood pressure causes heart disease and increases the risk of stroke.  Ongoing high blood pressure should be treated with medicines if weight loss and exercise do not work.  If you are 71-52 years old, ask your health care provider if you should take aspirin to prevent strokes.  Diabetes screening involves taking a blood sample to check your fasting blood sugar level. This should be done once every 3 years, after age 79, if you are within normal weight and without risk factors for diabetes. Testing should be considered at a younger age or be carried out more frequently  if you are overweight and have at least 1 risk factor for diabetes.  Breast cancer screening is essential preventive care for women. You should practice "breast self-awareness." This means understanding the normal appearance and feel of your breasts and may include breast self-examination. Any changes detected, no matter how small, should be reported to a health care provider. Women in their 40s and 30s should have a clinical breast exam (CBE) by a health care provider as part of a regular health exam every 1 to 3 years. After age 28, women should have a CBE every year. Starting at age 89, women should consider having a mammogram (breast X-ray test) every year. Women who have a family history of breast cancer should talk to their health care provider about genetic screening. Women  at a high risk of breast cancer should talk to their health care providers about having an MRI and a mammogram every year.  Breast cancer gene (BRCA)-related cancer risk assessment is recommended for women who have family members with BRCA-related cancers. BRCA-related cancers include breast, ovarian, tubal, and peritoneal cancers. Having family members with these cancers may be associated with an increased risk for harmful changes (mutations) in the breast cancer genes BRCA1 and BRCA2. Results of the assessment will determine the need for genetic counseling and BRCA1 and BRCA2 testing.  Routine pelvic exams to screen for cancer are no longer recommended for nonpregnant women who are considered low risk for cancer of the pelvic organs (ovaries, uterus, and vagina) and who do not have symptoms. Ask your health care provider if a screening pelvic exam is right for you.  If you have had past treatment for cervical cancer or a condition that could lead to cancer, you need Pap tests and screening for cancer for at least 20 years after your treatment. If Pap tests have been discontinued, your risk factors (such as having a new sexual partner) need to be reassessed to determine if screening should be resumed. Some women have medical problems that increase the chance of getting cervical cancer. In these cases, your health care provider may recommend more frequent screening and Pap tests.    Colorectal cancer can be detected and often prevented. Most routine colorectal cancer screening begins at the age of 75 years and continues through age 52 years. However, your health care provider may recommend screening at an earlier age if you have risk factors for colon cancer. On a yearly basis, your health care provider may provide home test kits to check for hidden blood in the stool. Use of a small camera at the end of a tube, to directly examine the colon (sigmoidoscopy or colonoscopy), can detect the earliest forms of  colorectal cancer. Talk to your health care provider about this at age 37, when routine screening begins. Direct exam of the colon should be repeated every 5-10 years through age 59 years, unless early forms of pre-cancerous polyps or small growths are found.  Osteoporosis is a disease in which the bones lose minerals and strength with aging. This can result in serious bone fractures or breaks. The risk of osteoporosis can be identified using a bone density scan. Women ages 63 years and over and women at risk for fractures or osteoporosis should discuss screening with their health care providers. Ask your health care provider whether you should take a calcium supplement or vitamin D to reduce the rate of osteoporosis.  Menopause can be associated with physical symptoms and risks. Hormone replacement therapy is available to  decrease symptoms and risks. You should talk to your health care provider about whether hormone replacement therapy is right for you.  Use sunscreen. Apply sunscreen liberally and repeatedly throughout the day. You should seek shade when your shadow is shorter than you. Protect yourself by wearing long sleeves, pants, a wide-brimmed hat, and sunglasses year round, whenever you are outdoors.  Once a month, do a whole body skin exam, using a mirror to look at the skin on your back. Tell your health care provider of new moles, moles that have irregular borders, moles that are larger than a pencil eraser, or moles that have changed in shape or color.  Stay current with required vaccines (immunizations).  Influenza vaccine. All adults should be immunized every year.  Tetanus, diphtheria, and acellular pertussis (Td, Tdap) vaccine. Pregnant women should receive 1 dose of Tdap vaccine during each pregnancy. The dose should be obtained regardless of the length of time since the last dose. Immunization is preferred during the 27th-36th week of gestation. An adult who has not previously  received Tdap or who does not know her vaccine status should receive 1 dose of Tdap. This initial dose should be followed by tetanus and diphtheria toxoids (Td) booster doses every 10 years. Adults with an unknown or incomplete history of completing a 3-dose immunization series with Td-containing vaccines should begin or complete a primary immunization series including a Tdap dose. Adults should receive a Td booster every 10 years.    Zoster vaccine. One dose is recommended for adults aged 11 years or older unless certain conditions are present.    Pneumococcal 13-valent conjugate (PCV13) vaccine. When indicated, a person who is uncertain of her immunization history and has no record of immunization should receive the PCV13 vaccine. An adult aged 62 years or older who has certain medical conditions and has not been previously immunized should receive 1 dose of PCV13 vaccine. This PCV13 should be followed with a dose of pneumococcal polysaccharide (PPSV23) vaccine. The PPSV23 vaccine dose should be obtained at least 8 weeks after the dose of PCV13 vaccine. An adult aged 51 years or older who has certain medical conditions and previously received 1 or more doses of PPSV23 vaccine should receive 1 dose of PCV13. The PCV13 vaccine dose should be obtained 1 or more years after the last PPSV23 vaccine dose.    Pneumococcal polysaccharide (PPSV23) vaccine. When PCV13 is also indicated, PCV13 should be obtained first. All adults aged 76 years and older should be immunized. An adult younger than age 102 years who has certain medical conditions should be immunized. Any person who resides in a nursing home or long-term care facility should be immunized. An adult smoker should be immunized. People with an immunocompromised condition and certain other conditions should receive both PCV13 and PPSV23 vaccines. People with human immunodeficiency virus (HIV) infection should be immunized as soon as possible after  diagnosis. Immunization during chemotherapy or radiation therapy should be avoided. Routine use of PPSV23 vaccine is not recommended for [place] Indians, [place] Natives, or people younger than 65 years unless there are medical conditions that require PPSV23 vaccine. When indicated, people who have unknown immunization and have no record of immunization should receive PPSV23 vaccine. One-time revaccination 5 years after the first dose of PPSV23 is recommended for people aged 19-64 years who have chronic kidney failure, nephrotic syndrome, asplenia, or immunocompromised conditions. People who received 1-2 doses of PPSV23 before age 53 years should receive another dose of PPSV23 vaccine at age  65 years or later if at least 5 years have passed since the previous dose. Doses of PPSV23 are not needed for people immunized with PPSV23 at or after age 65 years.   Preventive Services / Frequency  Ages 57 years and over  Blood pressure check.  Lipid and cholesterol check.  Lung cancer screening. / Every year if you are aged 36-80 years and have a 30-pack-year history of smoking and currently smoke or have quit within the past 15 years. Yearly screening is stopped once you have quit smoking for at least 15 years or develop a health problem that would prevent you from having lung cancer treatment.  Clinical breast exam.** / Every year after age 48 years.  BRCA-related cancer risk assessment.** / For women who have family members with a BRCA-related cancer (breast, ovarian, tubal, or peritoneal cancers).  Mammogram.** / Every year beginning at age 70 years and continuing for as long as you are in good health. Consult with your health care provider.  Pap test.** / Every 3 years starting at age 49 years through age 40 or 50 years with 3 consecutive normal Pap tests. Testing can be stopped between 65 and 70 years with 3 consecutive normal Pap tests and no abnormal Pap or HPV tests in the past 10 years.  Fecal  occult blood test (FOBT) of stool. / Every year beginning at age 50 years and continuing until age 80 years. You may not need to do this test if you get a colonoscopy every 10 years.  Flexible sigmoidoscopy or colonoscopy.** / Every 5 years for a flexible sigmoidoscopy or every 10 years for a colonoscopy beginning at age 33 years and continuing until age 24 years.  Hepatitis C blood test.** / For all people born from [birth year] through 1966 and any individual with known risks for hepatitis C.  Osteoporosis screening.** / A one-time screening for women ages 35 years and over and women at risk for fractures or osteoporosis.  Skin self-exam. / Monthly.  Influenza vaccine. / Every year.  Tetanus, diphtheria, and acellular pertussis (Tdap/Td) vaccine.** / 1 dose of Td every 10 years.  Zoster vaccine.** / 1 dose for adults aged 61 years or older.  Pneumococcal 13-valent conjugate (PCV13) vaccine.** / Consult your health care provider.  Pneumococcal polysaccharide (PPSV23) vaccine.** / 1 dose for all adults aged 51 years and older. Screening for abdominal aortic aneurysm (AAA)  by ultrasound is recommended for people who have history of high blood pressure or who are current or former smokers.

## 2015-01-10 LAB — URINALYSIS, ROUTINE W REFLEX MICROSCOPIC
Bilirubin Urine: NEGATIVE
Glucose, UA: NEGATIVE mg/dL
Hgb urine dipstick: NEGATIVE
LEUKOCYTES UA: NEGATIVE
Nitrite: NEGATIVE
PH: 5.5 (ref 5.0–8.0)
PROTEIN: NEGATIVE mg/dL
Specific Gravity, Urine: 1.026 (ref 1.005–1.030)
Urobilinogen, UA: 0.2 mg/dL (ref 0.0–1.0)

## 2015-01-10 LAB — MICROALBUMIN / CREATININE URINE RATIO
Creatinine, Urine: 260.8 mg/dL
Microalb Creat Ratio: 1.9 mg/g (ref 0.0–30.0)
Microalb, Ur: 0.5 mg/dL (ref <2.0)

## 2015-01-10 LAB — VITAMIN D 25 HYDROXY (VIT D DEFICIENCY, FRACTURES): Vit D, 25-Hydroxy: 65 ng/mL (ref 30–100)

## 2015-01-10 LAB — TSH: TSH: 5.334 u[IU]/mL — ABNORMAL HIGH (ref 0.350–4.500)

## 2015-01-11 ENCOUNTER — Other Ambulatory Visit: Payer: Self-pay

## 2015-01-11 ENCOUNTER — Encounter: Payer: Self-pay | Admitting: Physician Assistant

## 2015-01-11 MED ORDER — LEVOTHYROXINE SODIUM 50 MCG PO TABS: 50.0000 ug | ORAL_TABLET | Freq: Every day | ORAL | Status: DC

## 2015-02-10 ENCOUNTER — Other Ambulatory Visit: Payer: PPO

## 2015-02-10 DIAGNOSIS — E039 Hypothyroidism, unspecified: Secondary | ICD-10-CM

## 2015-02-10 LAB — TSH: TSH: 0.76 u[IU]/mL (ref 0.350–4.500)

## 2015-03-27 ENCOUNTER — Ambulatory Visit (HOSPITAL_COMMUNITY)
Admission: RE | Admit: 2015-03-27 | Discharge: 2015-03-27 | Disposition: A | Discharge status: Home / Self Care | Payer: BLUE CROSS/BLUE SHIELD | Source: Ambulatory Visit | Attending: Internal Medicine | Admitting: Internal Medicine

## 2015-03-27 ENCOUNTER — Ambulatory Visit (INDEPENDENT_AMBULATORY_CARE_PROVIDER_SITE_OTHER): Payer: BLUE CROSS/BLUE SHIELD | Admitting: Internal Medicine

## 2015-03-27 ENCOUNTER — Encounter: Payer: Self-pay | Admitting: Internal Medicine

## 2015-03-27 VITALS — BP 124/88 | HR 80 | Temp 98.6°F | Resp 18 | Ht 66.0 in | Wt 118.0 lb

## 2015-03-27 DIAGNOSIS — L03114 Cellulitis of left upper limb: Secondary | ICD-10-CM | POA: Diagnosis not present

## 2015-03-27 DIAGNOSIS — M7989 Other specified soft tissue disorders: Secondary | ICD-10-CM | POA: Insufficient documentation

## 2015-03-27 DIAGNOSIS — W540XXA Bitten by dog, initial encounter: Secondary | ICD-10-CM

## 2015-03-27 DIAGNOSIS — M19042 Primary osteoarthritis, left hand: Secondary | ICD-10-CM | POA: Insufficient documentation

## 2015-03-27 DIAGNOSIS — M79642 Pain in left hand: Secondary | ICD-10-CM | POA: Insufficient documentation

## 2015-03-27 DIAGNOSIS — T148 Other injury of unspecified body region: Secondary | ICD-10-CM

## 2015-03-27 MED ORDER — TRAMADOL HCL 50 MG PO TABS: 50.0000 mg | ORAL_TABLET | Freq: Four times a day (QID) | ORAL | Status: DC | PRN

## 2015-03-27 MED ORDER — CEFTRIAXONE SODIUM 1 G IJ SOLR: 500.0000 mg | Freq: Once | INTRAMUSCULAR | Status: DC

## 2015-03-27 MED ORDER — CEFTRIAXONE SODIUM 1 G IJ SOLR
0.5000 g | Freq: Once | INTRAMUSCULAR | Status: AC
  Administered 2015-03-27: 0.5 g via INTRAMUSCULAR

## 2015-03-27 MED ORDER — AMOXICILLIN-POT CLAVULANATE 875-125 MG PO TABS: 1.0000 | ORAL_TABLET | Freq: Two times a day (BID) | ORAL | Status: DC

## 2015-03-27 MED ORDER — LEVOTHYROXINE SODIUM 50 MCG PO TABS: 50.0000 ug | ORAL_TABLET | Freq: Every day | ORAL | Status: DC

## 2015-03-27 NOTE — Progress Notes (Signed)
   Subjective:    Patient ID: Breanna Sparks, female    DOB: 01-20-1951, 64 y.o.   MRN: [id number]  Animal Bite  Pertinent negatives include no nausea and no vomiting.   Patient presents to the office for evaluation of dog bite to the left hand.  Patient reports that on Thursday her dog bit her hand because he was afraid of a thunderstorm.  She reports that she has been soaking it in epsom salt and also has been using ice.  She reports that the index finger has been the most swollen.  She feels like the swelling and the redness is increasing in severity.  She reports no drainage.  She reports that she is right handed.   Review of Systems  Constitutional: Negative for fever and chills.  Gastrointestinal: Negative for nausea and vomiting.  Musculoskeletal: Positive for joint swelling and arthralgias.  Skin: Positive for color change and wound.       Objective:   Physical Exam  Constitutional: She is oriented to person, place, and time. She appears well-developed and well-nourished. No distress.  HENT:  Head: Normocephalic.  Mouth/Throat: No oropharyngeal exudate.  Eyes: Conjunctivae and EOM are normal. Pupils are equal, round, and reactive to light. No scleral icterus.  Neck: Normal range of motion. Neck supple. No JVD present. No thyromegaly present.  Cardiovascular: Normal rate, regular rhythm, normal heart sounds and intact distal pulses.  Exam reveals no gallop and no friction rub.   No murmur heard. Pulmonary/Chest: Effort normal and breath sounds normal. No respiratory distress. She has no wheezes. She has no rales. She exhibits no tenderness.  Musculoskeletal: Normal range of motion.       Left hand: Normal sensation noted. Normal strength noted.       Hands: Lymphadenopathy:    She has no cervical adenopathy.  Neurological: She is alert and oriented to person, place, and time.  Skin: Skin is warm and dry. She is not diaphoretic.  Psychiatric: She has a normal mood and  affect. Her behavior is normal. Judgment and thought content normal.  Nursing note and vitals reviewed.         Assessment & Plan:    1. Dog bite -with surrounding cellulitis of the hand -full flexion and extension of the fingers present.  Do not think that this is flexor tenosynovitis currently but will give ceftriaxone and will send out with augmentin and will recheck in 2 days. -difficult to determine the depth of injury but given tenderness would like to rule out possible open fracture.   -ceftriaxone 500 mg IM given here. - DG Hand Complete Left; Future

## 2015-03-27 NOTE — Patient Instructions (Signed)

## 2015-03-29 ENCOUNTER — Ambulatory Visit (INDEPENDENT_AMBULATORY_CARE_PROVIDER_SITE_OTHER): Payer: BLUE CROSS/BLUE SHIELD | Admitting: Internal Medicine

## 2015-03-29 VITALS — BP 128/80 | HR 76 | Temp 98.2°F | Resp 18

## 2015-03-29 DIAGNOSIS — W540XXD Bitten by dog, subsequent encounter: Principal | ICD-10-CM

## 2015-03-29 DIAGNOSIS — S61452D Open bite of left hand, subsequent encounter: Secondary | ICD-10-CM

## 2015-03-29 NOTE — Progress Notes (Signed)
Patient ID: Breanna Sparks, female   DOB: 1951/02/13, 64 y.o.   MRN: [id number]  Assessment and Plan:   1. Dog bite, hand, left, subsequent encounter -infection improved dramatically -finish augmentin -call office for returning redness and swelling or pain -RICE therapy.    HPI 64 y.o.female presents for 2 day follow up of dog bite to the left hand 1 week ago. Patient reports that they have been doing well.  female is taking her Augmentin.  They are not having difficulty with their medications.  They report no adverse reactions.  She reports that swelling and redness are almost completely gone.  No fever, chills, nausea, and vomiting.  Patient also notes that she had a lesion removed on her left leg which recurred after a squamous cell was removed remotely.  She has asked the dermatology office to forward notes and path results to [place].  Past Medical History  Diagnosis Date  . Depression   . Anxiety   . Glaucoma   . Arthritis   . History of squamous cell carcinoma   . TMJ (temporomandibular joint syndrome)     wears gaurd at night  . GERD (gastroesophageal reflux disease)      Allergies  Allergen Reactions  . Codeine     REACTION: Itch/nausea/vomiting  . Meperidine Hcl     REACTION: itch  . Morphine     REACTION: tachycardia      Current Outpatient Prescriptions on File Prior to Visit  Medication Sig Dispense Refill  . amoxicillin-clavulanate (AUGMENTIN) 875-125 MG per tablet Take 1 tablet by mouth 2 (two) times daily. One po bid x 7 days 14 tablet 0  . Cholecalciferol (VITAMIN D PO) Take 1,000 Int'l Units by mouth daily.    . Cyanocobalamin (VITAMIN B-12 PO) Take by mouth daily.    . cyclobenzaprine (FLEXERIL) 10 MG tablet Take 1 tablet (10 mg total) by mouth 3 (three) times daily as needed for muscle spasms. 90 tablet 1  . escitalopram (LEXAPRO) 20 MG tablet Take 1 tablet (20 mg total) by mouth daily. 90 tablet 1  . levothyroxine (SYNTHROID, LEVOTHROID) 50 MCG  tablet Take 1 tablet (50 mcg total) by mouth daily before breakfast. 90 tablet 0  . LORazepam (ATIVAN) 2 MG tablet TAKE ONE-HALF TO ONE TABLET BY MOUTH AT BEDTIME AS NEEDED 90 tablet 1  . ranitidine (ZANTAC) 300 MG tablet Take twice a day while trying to get off PPI, then can go to once at night 60 tablet 3  . traMADol (ULTRAM) 50 MG tablet Take 1 tablet (50 mg total) by mouth every 6 (six) hours as needed. 30 tablet 0   No current facility-administered medications on file prior to visit.    ROS: all negative except above.   Physical Exam: There were no vitals filed for this visit. BP 128/80 mmHg  Pulse 76  Temp(Src) 98.2 F (36.8 C) (Temporal)  Resp 18 General Appearance: Well developed well nourished, non-toxic appearing in no apparent distress. Eyes: PERRLA, EOMs, conjunctiva w/ no swelling or erythema or discharge Sinuses: No Frontal/maxillary tenderness ENT/Mouth: Ear canals clear without swelling or erythema.  TM's normal bilaterally with no retractions, bulging, or loss of landmarks.   Neck: Supple, thyroid normal, no notable JVD  Respiratory: Respiratory effort normal, Clear breath sounds anteriorly and posteriorly bilaterally without rales, rhonchi, wheezing or stridor. No retractions or accessory muscle usage. Cardio: RRR with no MRGs.   Abdomen: Soft, + BS.  Non tender, no guarding, rebound, hernias, masses.  Musculoskeletal:  Full ROM, 5/5 strength, normal gait. Left hand without redness or swelling.  Full ROM.  Neurovascularly intact.  1 cm scabbed puncture wound noted with no redness warmth or drainage. Skin: Warm, dry without rashes  Neuro: Awake and oriented X 3, Cranial nerves intact. Normal muscle tone, no cerebellar symptoms. Sensation intact.  Psych: normal affect, Insight and Judgment appropriate.     FORCUCCI, Deunta Beneke, PA-C 2:47 PM [facility]

## 2015-03-29 NOTE — Patient Instructions (Signed)
Animal Bite °Animal bite wounds can get infected. It is important to get proper medical treatment. Ask your doctor if you need a rabies shot. °HOME CARE  °· Follow your doctor's instructions for taking care of your wound. °· Only take medicine as told by your doctor. °· Take your medicine (antibiotics) as told. Finish them even if you start to feel better. °· Keep all doctor visits as told. °You may need a tetanus shot if:  °· You cannot remember when you had your last tetanus shot. °· You have never had a tetanus shot. °· The injury broke your skin. °If you need a tetanus shot and you choose not to have one, you may get tetanus. Sickness from tetanus can be serious. °GET HELP RIGHT AWAY IF:  °· Your wound is warm, red, sore, or puffy (swollen). °· You notice yellowish-white fluid (pus) or a bad smell coming from the wound. °· You see a red line on the skin coming from the wound. °· You have a fever, chills, or you feel sick. °· You feel sick to your stomach (nauseous), or you throw up (vomit). °· Your pain does not go away, or it gets worse. °· You have trouble moving the injured part. °· You have questions or concerns. °MAKE SURE YOU:  °· Understand these instructions. °· Will watch your condition. °· Will get help right away if you are not doing well or get worse. °Document Released: 09/23/2005 Document Revised: 12/16/2011 Document Reviewed: 05/15/2011 °[employer]® Patient Information ©2015 [employer]. This information is not intended to replace advice given to you by your health care provider. Make sure you discuss any questions you have with your health care provider. ° ° ° °

## 2015-05-13 ENCOUNTER — Other Ambulatory Visit: Payer: Self-pay | Admitting: Physician Assistant

## 2015-06-15 ENCOUNTER — Ambulatory Visit: Payer: PPO

## 2015-06-26 ENCOUNTER — Ambulatory Visit (INDEPENDENT_AMBULATORY_CARE_PROVIDER_SITE_OTHER): Payer: PPO

## 2015-06-26 DIAGNOSIS — Z23 Encounter for immunization: Secondary | ICD-10-CM | POA: Diagnosis not present

## 2015-07-06 ENCOUNTER — Other Ambulatory Visit: Payer: Self-pay | Admitting: Internal Medicine

## 2015-07-11 ENCOUNTER — Other Ambulatory Visit: Payer: Self-pay | Admitting: Physician Assistant

## 2015-07-21 ENCOUNTER — Other Ambulatory Visit: Payer: Self-pay | Admitting: Physician Assistant

## 2015-07-25 ENCOUNTER — Ambulatory Visit (INDEPENDENT_AMBULATORY_CARE_PROVIDER_SITE_OTHER): Payer: PPO | Admitting: Physician Assistant

## 2015-07-25 ENCOUNTER — Encounter: Payer: Self-pay | Admitting: Physician Assistant

## 2015-07-25 VITALS — BP 120/62 | HR 87 | Temp 97.3°F | Resp 14 | Ht 66.0 in | Wt 118.0 lb

## 2015-07-25 DIAGNOSIS — Z0001 Encounter for general adult medical examination with abnormal findings: Secondary | ICD-10-CM | POA: Diagnosis not present

## 2015-07-25 DIAGNOSIS — F419 Anxiety disorder, unspecified: Secondary | ICD-10-CM

## 2015-07-25 DIAGNOSIS — F325 Major depressive disorder, single episode, in full remission: Secondary | ICD-10-CM | POA: Diagnosis not present

## 2015-07-25 DIAGNOSIS — B009 Herpesviral infection, unspecified: Secondary | ICD-10-CM | POA: Diagnosis not present

## 2015-07-25 DIAGNOSIS — Z681 Body mass index (BMI) 19 or less, adult: Secondary | ICD-10-CM

## 2015-07-25 DIAGNOSIS — G4700 Insomnia, unspecified: Secondary | ICD-10-CM

## 2015-07-25 DIAGNOSIS — M1990 Unspecified osteoarthritis, unspecified site: Secondary | ICD-10-CM | POA: Diagnosis not present

## 2015-07-25 DIAGNOSIS — I10 Essential (primary) hypertension: Secondary | ICD-10-CM

## 2015-07-25 DIAGNOSIS — M545 Low back pain, unspecified: Secondary | ICD-10-CM

## 2015-07-25 DIAGNOSIS — Z9181 History of falling: Secondary | ICD-10-CM

## 2015-07-25 DIAGNOSIS — Z789 Other specified health status: Secondary | ICD-10-CM

## 2015-07-25 DIAGNOSIS — R0989 Other specified symptoms and signs involving the circulatory and respiratory systems: Secondary | ICD-10-CM

## 2015-07-25 DIAGNOSIS — Z0000 Encounter for general adult medical examination without abnormal findings: Secondary | ICD-10-CM

## 2015-07-25 DIAGNOSIS — K219 Gastro-esophageal reflux disease without esophagitis: Secondary | ICD-10-CM

## 2015-07-25 DIAGNOSIS — E039 Hypothyroidism, unspecified: Secondary | ICD-10-CM

## 2015-07-25 DIAGNOSIS — Z8589 Personal history of malignant neoplasm of other organs and systems: Secondary | ICD-10-CM | POA: Diagnosis not present

## 2015-07-25 DIAGNOSIS — R6889 Other general symptoms and signs: Secondary | ICD-10-CM | POA: Diagnosis not present

## 2015-07-25 DIAGNOSIS — H409 Unspecified glaucoma: Secondary | ICD-10-CM

## 2015-07-25 DIAGNOSIS — Z23 Encounter for immunization: Secondary | ICD-10-CM | POA: Diagnosis not present

## 2015-07-25 DIAGNOSIS — F32A Depression, unspecified: Secondary | ICD-10-CM

## 2015-07-25 DIAGNOSIS — F329 Major depressive disorder, single episode, unspecified: Secondary | ICD-10-CM | POA: Diagnosis not present

## 2015-07-25 MED ORDER — CYCLOBENZAPRINE HCL 10 MG PO TABS: ORAL_TABLET | ORAL | Status: DC

## 2015-07-25 MED ORDER — VALACYCLOVIR HCL 1 G PO TABS: 1000.0000 mg | ORAL_TABLET | Freq: Every day | ORAL | Status: DC

## 2015-07-25 MED ORDER — ESCITALOPRAM OXALATE 20 MG PO TABS: 20.0000 mg | ORAL_TABLET | Freq: Every day | ORAL | Status: DC

## 2015-07-25 MED ORDER — LEVOTHYROXINE SODIUM 50 MCG PO TABS: 50.0000 ug | ORAL_TABLET | Freq: Every day | ORAL | Status: DC

## 2015-07-25 MED ORDER — LORAZEPAM 2 MG PO TABS: ORAL_TABLET | ORAL | Status: DC

## 2015-07-25 NOTE — Progress Notes (Signed)
WELCOME TO MEDICARE VISIT AND FOLLOW UP  Assessment:   1. Glaucoma Continue close follow up with eye doctor.   2. Depression, major, in remission ([place]) In remission - LORazepam (ATIVAN) 2 MG tablet; TAKE ONE-HALF TO ONE  TABLET BY MOUTH ONCE DAILY AT BEDTIME AS NEEDED  Dispense: 90 tablet; Refill: 2 - escitalopram (LEXAPRO) 20 MG tablet; Take 1 tablet (20 mg total) by mouth daily.  Dispense: 90 tablet; Refill: 2  3. History of squamous cell carcinoma Continue close follow up with Dr. Martinique.   4. Insomnia  good sleep hygiene discussed, increase day time activity - LORazepam (ATIVAN) 2 MG tablet; TAKE ONE-HALF TO ONE  TABLET BY MOUTH ONCE DAILY AT BEDTIME AS NEEDED  Dispense: 90 tablet; Refill: 2  5. Arthritis RICE, NSAIDS, exercises given, if not better get xray and PT referral or ortho referral.   6. Bilateral low back pain without sciatica Improved, continue stretches, etc  7. HSV-1 (herpes simplex virus 1) infection - valACYclovir (VALTREX) 1000 MG tablet; Take 1 tablet (1,000 mg total) by mouth daily.  Dispense: 30 tablet; Refill: 2  8. Anxiety - escitalopram (LEXAPRO) 20 MG tablet; Take 1 tablet (20 mg total) by mouth daily.  Dispense: 90 tablet; Refill: 2  9. Encounter for Medicare annual wellness exam Will get DEXA with MGM in Jan.   10.  At low risk for fall  11. Gastroesophageal reflux disease without esophagitis Can take PPI PRN, otherwise diet controlled and with H2  12. Need for prophylactic vaccination against Streptococcus pneumoniae (pneumococcus) - Pneumococcal conjugate vaccine 13-valent IM  13. Hypothyroidism, unspecified hypothyroidism type - levothyroxine (SYNTHROID, LEVOTHROID) 50 MCG tablet; Take 1 tablet (50 mcg total) by mouth daily before breakfast.  Dispense: 90 tablet; Refill: 3  14. Labile hypertension - EKG 12-Lead - [place], RETROPERITNL ABD,  LTD   Over 30 minutes of exam, counseling, chart review, and critical decision making was  performed  Plan:   During the course of the visit the patient was educated and counseled about appropriate screening and preventive services including:    Pneumococcal vaccine   Influenza vaccine  Td vaccine  Prevnar 13  Screening electrocardiogram  Screening mammography  Bone densitometry screening  Colorectal cancer screening  Diabetes screening  Glaucoma screening  Nutrition counseling   Advanced directives: given info/requested copies  Conditions/risks identified: Urinary Incontinence is not an issue: discussed non pharmacology and pharmacology options.  Fall risk: low- discussed PT, home fall assessment, medications.    Subjective:   Breanna Sparks is a 64 y.o. female who presents for Welcome to Medicare visit and follow up for chol, vitamin D, and medications.    Her blood pressure has been controlled at home, today their BP is BP: 120/62 mmHg She does workout. She denies chest pain, shortness of breath, dizziness.  She is not on cholesterol medication and denies myalgias. Her cholesterol is at goal. The cholesterol last visit was:   Lab Results  Component Value Date   CHOL 234* 01/09/2015   HDL 100 01/09/2015   LDLCALC 114* 01/09/2015   TRIG 98 01/09/2015   CHOLHDL 2.3 01/09/2015   . Last A1C in the office was:  Lab Results  Component Value Date   HGBA1C 5.1 11/16/2013   Patient is on Vitamin D supplement. Lab Results  Component Value Date   VD25OH 65 01/09/2015     History of SCC, follows with DERM q 3 months.  She has glacoma and follows with her eye doctor every  3 months.  She is on thyroid medication. Her medication was not changed last visit.   Lab Results  Component Value Date   TSH 0.760 02/09/2015  .  Patient is on lexapro 20 and states she is doing well with it. Takes ativan at night for sleep.  She is on estrogen/progresterone cream, gets a combo from [place].  She has GERD, has been off PPI and on zantac, doing well.   She has history of elevated AST/ALT, normal hep screen 05/2014.  Lab Results  Component Value Date   ALT 35 01/09/2015   AST 42* 01/09/2015   ALKPHOS 41 01/09/2015   BILITOT 0.5 01/09/2015    Medication Review Current Outpatient Prescriptions on File Prior to Visit  Medication Sig Dispense Refill  . Cholecalciferol (VITAMIN D PO) Take 1,000 Int'l Units by mouth daily.    . Cyanocobalamin (VITAMIN B-12 PO) Take by mouth daily.    . cyclobenzaprine (FLEXERIL) 10 MG tablet TAKE ONE TABLET BY MOUTH THREE TIMES DAILY AS NEEDED FOR MUSCLE SPASMS 90 tablet 0  . ranitidine (ZANTAC) 300 MG tablet Take twice a day while trying to get off PPI, then can go to once at night 60 tablet 3   No current facility-administered medications on file prior to visit.    Current Problems (verified) Patient Active Problem List   Diagnosis Date Noted  . Encounter for Medicare annual wellness exam 07/25/2015  . Lower back pain 01/09/2015  . Insomnia 01/09/2015  . HSV-1 (herpes simplex virus 1) infection 01/09/2015  . Depression, major, in remission ([place])   . Anxiety   . Glaucoma   . Arthritis   . History of squamous cell carcinoma     Screening Tests Immunization History  Administered Date(s) Administered  . Influenza Split 07/02/2013, 07/06/2014  . Influenza, High Dose Seasonal PF 06/26/2015  . Pneumococcal Polysaccharide-23 02/03/2014  . Tdap 11/13/2012  . Zoster 11/16/2013    Preventative care: Last colonoscopy: 2009, repeat 10 years, [facility] EGD 2009 Last 3D mammogram: 10/06/2014, CAT B, implants.  Last pap smear/pelvic exam: TAH, declines DEXA: will get with MGM in Dec/Jan  Prior vaccinations: Tdap: 2014  Influenza: 2016 Pneumococcal: 2015 Prevnar13: DUE TODAY Shingles/Zostavax: 2015  Names of Other Physician/Practitioners you currently use: 1. [facility]- here for primary care 2. Dr. Nicki Reaper, eye doctor, last visit 06/2015, pressure was  stable 3. Dr. Mina Marble, dentist, last visit 02/2015 Patient Care Team: Unk Pinto, MD as PCP - General (Internal Medicine) Lafayette Dragon, MD as Consulting Physician (Gastroenterology) Macarthur Critchley, [place] as Referring Physician (Optometry) Amy Martinique, MD as Consulting Physician (Dermatology) Kristeen Miss, MD as Consulting Physician (Neurosurgery)  Past Surgical History  Procedure Laterality Date  . Abdominal hysterectomy      age 36  . Breast surgery      implants  . Cervical fusion  2012    Dr. Ellene Route  . Squamous cell carcinoma excision      Multiple  . Anterior cruciate ligament repair Bilateral 1998   Family History  Problem Relation Age of Onset  . Hypertension Mother   . Diabetes Mother   . Heart disease Mother   . Glaucoma Mother   . Heart attack Father    Social History  Substance Use Topics  . Smoking status: Never Smoker   . Smokeless tobacco: None  . Alcohol Use: Yes     Comment: Occasion    MEDICARE WELLNESS OBJECTIVES: Tobacco use: She does not smoke.  Patient is  not a former smoker. Alcohol Current alcohol use: social drinker Osteoporosis: postmenopausal estrogen deficiency and dietary calcium and/or vitamin D deficiency, History of fracture in the past year: no Fall risk: Low Risk Hearing: normal Visual acuity: normal,  does perform annual eye exam Diet: in general, a "healthy" diet   Physical activity: Current Exercise Habits:: Home exercise routine, Type of exercise: walking;exercise ball;strength training/weights, Time (Minutes): 25, Frequency (Times/Week): 3, Weekly Exercise (Minutes/Week): 75, Intensity: Moderate Cardiac risk factors: Cardiac Risk Factors include: advanced age (>34men, >79 women);dyslipidemia Depression/mood screen:   Depression screen [facility] 2/9 07/25/2015  Decreased Interest 0  Down, Depressed, Hopeless 0  PHQ - 2 Score 0    ADLs:  In your present state of health, do you have any difficulty performing the following activities:  07/25/2015  Hearing? N  Vision? N  Difficulty concentrating or making decisions? N  Walking or climbing stairs? N  Dressing or bathing? N  Doing errands, shopping? N  Preparing Food and eating ? N  Using the Toilet? N  In the past six months, have you accidently leaked urine? N  Do you have problems with loss of bowel control? N  Managing your Medications? N  Managing your Finances? N  Housekeeping or managing your Housekeeping? N    Cognitive Testing  Alert? Yes  Normal Appearance?Yes  Oriented to person? Yes  Place? Yes   Time? Yes  Recall of three objects?  Yes  Can perform simple calculations? Yes  Displays appropriate judgment?Yes  Can read the correct time from a watch face?Yes  EOL planning: Does patient have an advance directive?: Yes Type of Advance Directive: Healthcare Power of Attorney, Living will Does patient want to make changes to advanced directive?: Yes - information given Copy of advanced directive(s) in chart?: No - copy requested   Review of Systems  Constitutional: Negative.   HENT: Negative.   Eyes: Negative.   Respiratory: Negative.   Cardiovascular: Negative.   Gastrointestinal: Negative.   Genitourinary: Negative.   Musculoskeletal: Negative.   Skin: Negative.   Neurological: Negative.   Endo/Heme/Allergies: Negative.   Psychiatric/Behavioral: Negative.      Objective:   Today's Vitals   07/25/15 1117  BP: 120/62  Pulse: 87  Temp: 97.3 F (36.3 C)  TempSrc: Temporal  Resp: 14  Height: 5\' 6"  (1.676 m)  Weight: 118 lb (53.524 kg)  SpO2: 98%   Body mass index is 19.05 kg/(m^2).   General appearance: alert, no distress, WD/WN,  female HEENT: normocephalic, sclerae anicteric, TMs pearly, nares patent, no discharge or erythema, pharynx normal Oral cavity: MMM, no lesions Neck: supple, no lymphadenopathy, no thyromegaly, no masses Heart: RRR, normal S1, S2, no murmurs Lungs: CTA bilaterally, no wheezes, rhonchi, or rales Abdomen:  +bs, soft, non tender, non distended, no masses, no hepatomegaly, no splenomegaly Musculoskeletal: nontender, no swelling, no obvious deformity Extremities: no edema, no cyanosis, no clubbing Pulses: 2+ symmetric, upper and lower extremities, normal cap refill Neurological: alert, oriented x 3, CN2-12 intact, strength normal upper extremities and lower extremities, sensation normal throughout, DTRs 2+ throughout, no cerebellar signs, gait normal Psychiatric: normal affect, behavior normal, pleasant  Breast: defer Gyn: defer Rectal: defer   Medicare Attestation I have personally reviewed: The patient's medical and social history Their use of alcohol, tobacco or illicit drugs Their current medications and supplements The patient's functional ability including ADLs,fall risks, home safety risks, cognitive, and hearing and visual impairment Diet and physical activities Evidence for depression or mood disorders  The  patient's weight, height, BMI, and visual acuity have been recorded in the chart.  I have made referrals, counseling, and provided education to the patient based on review of the above and I have provided the patient with a written personalized care plan for preventive services.     Vicie Mutters, PA-C   07/25/2015

## 2015-07-25 NOTE — Patient Instructions (Addendum)
Will get DEXA with MGM in [facility] A healthy lifestyle and preventive care can promote health and wellness. Preventive health guidelines for women include the following key practices.  A routine yearly physical is a good way to check with your health care provider about your health and preventive screening. It is a chance to share any concerns and updates on your health and to receive a thorough exam.  Visit your dentist for a routine exam and preventive care every 6 months. Brush your teeth twice a day and floss once a day. Good oral hygiene prevents tooth decay and gum disease.  The frequency of eye exams is based on your age, health, family medical history, use of contact lenses, and other factors. Follow your health care provider's recommendations for frequency of eye exams.  Eat a healthy diet. Foods like vegetables, fruits, whole grains, low-fat dairy products, and lean protein foods contain the nutrients you need without too many calories. Decrease your intake of foods high in solid fats, added sugars, and salt. Eat the right amount of calories for you.Get information about a proper diet from your health care provider, if necessary.  Regular physical exercise is one of the most important things you can do for your health. Most adults should get at least 150 minutes of moderate-intensity exercise (any activity that increases your heart rate and causes you to sweat) each week. In addition, most adults need muscle-strengthening exercises on 2 or more days a week.  Maintain a healthy weight. The body mass index (BMI) is a screening tool to identify possible weight problems. It provides an estimate of body fat based on height and weight. Your health care provider can find your BMI and can help you achieve or maintain a healthy weight.For adults 20 years and older:  A BMI below 18.5 is considered underweight.  A BMI of 18.5 to 24.9 is normal.  A BMI of 25 to 29.9 is  considered overweight.  A BMI of 30 and above is considered obese.  Maintain normal blood lipids and cholesterol levels by exercising and minimizing your intake of saturated fat. Eat a balanced diet with plenty of fruit and vegetables. If your lipid or cholesterol levels are high, you are over 50, or you are at high risk for heart disease, you may need your cholesterol levels checked more frequently.Ongoing high lipid and cholesterol levels should be treated with medicines if diet and exercise are not working.  If you smoke, find out from your health care provider how to quit. If you do not use tobacco, do not start.  Lung cancer screening is recommended for adults aged 1-80 years who are at high risk for developing lung cancer because of a history of smoking. A yearly low-dose CT scan of the lungs is recommended for people who have at least a 30-pack-year history of smoking and are a current smoker or have quit within the past 15 years. A pack year of smoking is smoking an average of 1 pack of cigarettes a day for 1 year (for example: 1 pack a day for 30 years or 2 packs a day for 15 years). Yearly screening should continue until the smoker has stopped smoking for at least 15 years. Yearly screening should be stopped for people who develop a health problem that would prevent them from having lung cancer treatment.  Avoid use of street drugs. Do not share needles with anyone. Ask for help if you need support or instructions about stopping  the use of drugs.  High blood pressure causes heart disease and increases the risk of stroke.  Ongoing high blood pressure should be treated with medicines if weight loss and exercise do not work.  If you are 20-13 years old, ask your health care provider if you should take aspirin to prevent strokes.  Diabetes screening involves taking a blood sample to check your fasting blood sugar level. This should be done once every 3 years, after age 64, if you are within  normal weight and without risk factors for diabetes. Testing should be considered at a younger age or be carried out more frequently if you are overweight and have at least 1 risk factor for diabetes.  Breast cancer screening is essential preventive care for women. You should practice "breast self-awareness." This means understanding the normal appearance and feel of your breasts and may include breast self-examination. Any changes detected, no matter how small, should be reported to a health care provider. Women in their 51s and 30s should have a clinical breast exam (CBE) by a health care provider as part of a regular health exam every 1 to 3 years. After age 85, women should have a CBE every year. Starting at age 38, women should consider having a mammogram (breast X-ray test) every year. Women who have a family history of breast cancer should talk to their health care provider about genetic screening. Women at a high risk of breast cancer should talk to their health care providers about having an MRI and a mammogram every year.  Breast cancer gene (BRCA)-related cancer risk assessment is recommended for women who have family members with BRCA-related cancers. BRCA-related cancers include breast, ovarian, tubal, and peritoneal cancers. Having family members with these cancers may be associated with an increased risk for harmful changes (mutations) in the breast cancer genes BRCA1 and BRCA2. Results of the assessment will determine the need for genetic counseling and BRCA1 and BRCA2 testing.  Routine pelvic exams to screen for cancer are no longer recommended for nonpregnant women who are considered low risk for cancer of the pelvic organs (ovaries, uterus, and vagina) and who do not have symptoms. Ask your health care provider if a screening pelvic exam is right for you.  If you have had past treatment for cervical cancer or a condition that could lead to cancer, you need Pap tests and screening for  cancer for at least 20 years after your treatment. If Pap tests have been discontinued, your risk factors (such as having a new sexual partner) need to be reassessed to determine if screening should be resumed. Some women have medical problems that increase the chance of getting cervical cancer. In these cases, your health care provider may recommend more frequent screening and Pap tests.    Colorectal cancer can be detected and often prevented. Most routine colorectal cancer screening begins at the age of 73 years and continues through age 2 years. However, your health care provider may recommend screening at an earlier age if you have risk factors for colon cancer. On a yearly basis, your health care provider may provide home test kits to check for hidden blood in the stool. Use of a small camera at the end of a tube, to directly examine the colon (sigmoidoscopy or colonoscopy), can detect the earliest forms of colorectal cancer. Talk to your health care provider about this at age 56, when routine screening begins. Direct exam of the colon should be repeated every 5-10 years through age 95  years, unless early forms of pre-cancerous polyps or small growths are found.  Osteoporosis is a disease in which the bones lose minerals and strength with aging. This can result in serious bone fractures or breaks. The risk of osteoporosis can be identified using a bone density scan. Women ages 68 years and over and women at risk for fractures or osteoporosis should discuss screening with their health care providers. Ask your health care provider whether you should take a calcium supplement or vitamin D to reduce the rate of osteoporosis.  Menopause can be associated with physical symptoms and risks. Hormone replacement therapy is available to decrease symptoms and risks. You should talk to your health care provider about whether hormone replacement therapy is right for you.  Use sunscreen. Apply sunscreen liberally  and repeatedly throughout the day. You should seek shade when your shadow is shorter than you. Protect yourself by wearing long sleeves, pants, a wide-brimmed hat, and sunglasses year round, whenever you are outdoors.  Once a month, do a whole body skin exam, using a mirror to look at the skin on your back. Tell your health care provider of new moles, moles that have irregular borders, moles that are larger than a pencil eraser, or moles that have changed in shape or color.  Stay current with required vaccines (immunizations).  Influenza vaccine. All adults should be immunized every year.  Tetanus, diphtheria, and acellular pertussis (Td, Tdap) vaccine. Pregnant women should receive 1 dose of Tdap vaccine during each pregnancy. The dose should be obtained regardless of the length of time since the last dose. Immunization is preferred during the 27th-36th week of gestation. An adult who has not previously received Tdap or who does not know her vaccine status should receive 1 dose of Tdap. This initial dose should be followed by tetanus and diphtheria toxoids (Td) booster doses every 10 years. Adults with an unknown or incomplete history of completing a 3-dose immunization series with Td-containing vaccines should begin or complete a primary immunization series including a Tdap dose. Adults should receive a Td booster every 10 years.    Zoster vaccine. One dose is recommended for adults aged 62 years or older unless certain conditions are present.    Pneumococcal 13-valent conjugate (PCV13) vaccine. When indicated, a person who is uncertain of her immunization history and has no record of immunization should receive the PCV13 vaccine. An adult aged 43 years or older who has certain medical conditions and has not been previously immunized should receive 1 dose of PCV13 vaccine. This PCV13 should be followed with a dose of pneumococcal polysaccharide (PPSV23) vaccine. The PPSV23 vaccine dose should be  obtained at least 8 weeks after the dose of PCV13 vaccine. An adult aged 32 years or older who has certain medical conditions and previously received 1 or more doses of PPSV23 vaccine should receive 1 dose of PCV13. The PCV13 vaccine dose should be obtained 1 or more years after the last PPSV23 vaccine dose.    Pneumococcal polysaccharide (PPSV23) vaccine. When PCV13 is also indicated, PCV13 should be obtained first. All adults aged 108 years and older should be immunized. An adult younger than age 80 years who has certain medical conditions should be immunized. Any person who resides in a nursing home or long-term care facility should be immunized. An adult smoker should be immunized. People with an immunocompromised condition and certain other conditions should receive both PCV13 and PPSV23 vaccines. People with human immunodeficiency virus (HIV) infection should be immunized as  soon as possible after diagnosis. Immunization during chemotherapy or radiation therapy should be avoided. Routine use of PPSV23 vaccine is not recommended for American Indians, [place] Natives, or people younger than 65 years unless there are medical conditions that require PPSV23 vaccine. When indicated, people who have unknown immunization and have no record of immunization should receive PPSV23 vaccine. One-time revaccination 5 years after the first dose of PPSV23 is recommended for people aged 19-64 years who have chronic kidney failure, nephrotic syndrome, asplenia, or immunocompromised conditions. People who received 1-2 doses of PPSV23 before age 40 years should receive another dose of PPSV23 vaccine at age 73 years or later if at least 5 years have passed since the previous dose. Doses of PPSV23 are not needed for people immunized with PPSV23 at or after age 67 years.   Preventive Services / Frequency  Ages 21 years and over  Blood pressure check.  Lipid and cholesterol check.  Lung cancer screening. / Every year if  you are aged 34-80 years and have a 30-pack-year history of smoking and currently smoke or have quit within the past 15 years. Yearly screening is stopped once you have quit smoking for at least 15 years or develop a health problem that would prevent you from having lung cancer treatment.  Clinical breast exam.** / Every year after age 80 years.  BRCA-related cancer risk assessment.** / For women who have family members with a BRCA-related cancer (breast, ovarian, tubal, or peritoneal cancers).  Mammogram.** / Every year beginning at age 32 years and continuing for as long as you are in good health. Consult with your health care provider.  Pap test.** / Every 3 years starting at age 82 years through age 21 or 64 years with 3 consecutive normal Pap tests. Testing can be stopped between 65 and 70 years with 3 consecutive normal Pap tests and no abnormal Pap or HPV tests in the past 10 years.  Fecal occult blood test (FOBT) of stool. / Every year beginning at age 82 years and continuing until age 66 years. You may not need to do this test if you get a colonoscopy every 10 years.  Flexible sigmoidoscopy or colonoscopy.** / Every 5 years for a flexible sigmoidoscopy or every 10 years for a colonoscopy beginning at age 48 years and continuing until age 66 years.  Hepatitis C blood test.** / For all people born from [birth year] through 1966 and any individual with known risks for hepatitis C.  Osteoporosis screening.** / A one-time screening for women ages 12 years and over and women at risk for fractures or osteoporosis.  Skin self-exam. / Monthly.  Influenza vaccine. / Every year.  Tetanus, diphtheria, and acellular pertussis (Tdap/Td) vaccine.** / 1 dose of Td every 10 years.  Zoster vaccine.** / 1 dose for adults aged 54 years or older.  Pneumococcal 13-valent conjugate (PCV13) vaccine.** / Consult your health care provider.  Pneumococcal polysaccharide (PPSV23) vaccine.** / 1 dose for all adults  aged 67 years and older. Screening for abdominal aortic aneurysm (AAA)  by ultrasound is recommended for people who have history of high blood pressure or who are current or former smokers.

## 2015-10-10 DIAGNOSIS — H401121 Primary open-angle glaucoma, left eye, mild stage: Secondary | ICD-10-CM | POA: Diagnosis not present

## 2015-10-11 ENCOUNTER — Other Ambulatory Visit: Payer: Self-pay | Admitting: Internal Medicine

## 2015-10-18 ENCOUNTER — Other Ambulatory Visit: Payer: Self-pay | Admitting: Physician Assistant

## 2015-10-18 DIAGNOSIS — Z1231 Encounter for screening mammogram for malignant neoplasm of breast: Secondary | ICD-10-CM

## 2015-11-13 DIAGNOSIS — H35372 Puckering of macula, left eye: Secondary | ICD-10-CM | POA: Diagnosis not present

## 2015-11-14 ENCOUNTER — Ambulatory Visit: Payer: BLUE CROSS/BLUE SHIELD

## 2015-11-14 ENCOUNTER — Ambulatory Visit
Admission: RE | Admit: 2015-11-14 | Discharge: 2015-11-14 | Disposition: A | Discharge status: Home / Self Care | Payer: PPO | Source: Ambulatory Visit | Attending: Physician Assistant | Admitting: Physician Assistant

## 2015-11-14 DIAGNOSIS — M8588 Other specified disorders of bone density and structure, other site: Secondary | ICD-10-CM | POA: Diagnosis not present

## 2015-11-14 DIAGNOSIS — Z1231 Encounter for screening mammogram for malignant neoplasm of breast: Secondary | ICD-10-CM

## 2015-11-14 DIAGNOSIS — E2839 Other primary ovarian failure: Secondary | ICD-10-CM

## 2015-11-28 DIAGNOSIS — D485 Neoplasm of uncertain behavior of skin: Secondary | ICD-10-CM | POA: Diagnosis not present

## 2015-11-28 DIAGNOSIS — L570 Actinic keratosis: Secondary | ICD-10-CM | POA: Diagnosis not present

## 2015-11-28 DIAGNOSIS — L821 Other seborrheic keratosis: Secondary | ICD-10-CM | POA: Diagnosis not present

## 2015-11-28 DIAGNOSIS — Z85828 Personal history of other malignant neoplasm of skin: Secondary | ICD-10-CM | POA: Diagnosis not present

## 2015-11-28 DIAGNOSIS — Z85820 Personal history of malignant melanoma of skin: Secondary | ICD-10-CM | POA: Diagnosis not present

## 2015-11-28 DIAGNOSIS — D225 Melanocytic nevi of trunk: Secondary | ICD-10-CM | POA: Diagnosis not present

## 2015-12-15 ENCOUNTER — Other Ambulatory Visit: Payer: Self-pay | Admitting: Physician Assistant

## 2016-01-10 ENCOUNTER — Encounter: Payer: Self-pay | Admitting: Physician Assistant

## 2016-01-10 ENCOUNTER — Ambulatory Visit (INDEPENDENT_AMBULATORY_CARE_PROVIDER_SITE_OTHER): Payer: PPO | Admitting: Physician Assistant

## 2016-01-10 VITALS — BP 124/66 | HR 82 | Temp 97.7°F | Resp 16 | Ht 66.5 in | Wt 116.2 lb

## 2016-01-10 DIAGNOSIS — Z0000 Encounter for general adult medical examination without abnormal findings: Secondary | ICD-10-CM | POA: Diagnosis not present

## 2016-01-10 DIAGNOSIS — Z79899 Other long term (current) drug therapy: Secondary | ICD-10-CM | POA: Diagnosis not present

## 2016-01-10 DIAGNOSIS — Z139 Encounter for screening, unspecified: Secondary | ICD-10-CM

## 2016-01-10 DIAGNOSIS — M1990 Unspecified osteoarthritis, unspecified site: Secondary | ICD-10-CM

## 2016-01-10 DIAGNOSIS — M545 Low back pain, unspecified: Secondary | ICD-10-CM

## 2016-01-10 DIAGNOSIS — Z13220 Encounter for screening for lipoid disorders: Secondary | ICD-10-CM

## 2016-01-10 DIAGNOSIS — R6889 Other general symptoms and signs: Secondary | ICD-10-CM | POA: Diagnosis not present

## 2016-01-10 DIAGNOSIS — K219 Gastro-esophageal reflux disease without esophagitis: Secondary | ICD-10-CM | POA: Diagnosis not present

## 2016-01-10 DIAGNOSIS — B009 Herpesviral infection, unspecified: Secondary | ICD-10-CM

## 2016-01-10 DIAGNOSIS — Z8589 Personal history of malignant neoplasm of other organs and systems: Secondary | ICD-10-CM | POA: Diagnosis not present

## 2016-01-10 DIAGNOSIS — M8580 Other specified disorders of bone density and structure, unspecified site: Secondary | ICD-10-CM | POA: Diagnosis not present

## 2016-01-10 DIAGNOSIS — Z0001 Encounter for general adult medical examination with abnormal findings: Secondary | ICD-10-CM | POA: Diagnosis not present

## 2016-01-10 DIAGNOSIS — E039 Hypothyroidism, unspecified: Secondary | ICD-10-CM | POA: Diagnosis not present

## 2016-01-10 DIAGNOSIS — F325 Major depressive disorder, single episode, in full remission: Secondary | ICD-10-CM

## 2016-01-10 DIAGNOSIS — G4700 Insomnia, unspecified: Secondary | ICD-10-CM | POA: Diagnosis not present

## 2016-01-10 DIAGNOSIS — E559 Vitamin D deficiency, unspecified: Secondary | ICD-10-CM

## 2016-01-10 DIAGNOSIS — Z1389 Encounter for screening for other disorder: Secondary | ICD-10-CM

## 2016-01-10 DIAGNOSIS — H409 Unspecified glaucoma: Secondary | ICD-10-CM | POA: Diagnosis not present

## 2016-01-10 DIAGNOSIS — F419 Anxiety disorder, unspecified: Secondary | ICD-10-CM

## 2016-01-10 LAB — CBC WITH DIFFERENTIAL/PLATELET
BASOS PCT: 0 %
Basophils Absolute: 0 cells/uL (ref 0–200)
EOS ABS: 320 {cells}/uL (ref 15–500)
EOS PCT: 5 %
HCT: 40 % (ref 35.0–45.0)
Hemoglobin: 13.4 g/dL (ref 11.7–15.5)
Lymphocytes Relative: 40 %
Lymphs Abs: 2560 cells/uL (ref 850–3900)
MCH: 35 pg — ABNORMAL HIGH (ref 27.0–33.0)
MCHC: 33.5 g/dL (ref 32.0–36.0)
MCV: 104.4 fL — AB (ref 80.0–100.0)
MONOS PCT: 10 %
MPV: 9 fL (ref 7.5–12.5)
Monocytes Absolute: 640 cells/uL (ref 200–950)
Neutro Abs: 2880 cells/uL (ref 1500–7800)
Neutrophils Relative %: 45 %
Platelets: 249 10*3/uL (ref 140–400)
RBC: 3.83 MIL/uL (ref 3.80–5.10)
RDW: 12.8 % (ref 11.0–15.0)
WBC: 6.4 10*3/uL (ref 3.8–10.8)

## 2016-01-10 MED ORDER — VALACYCLOVIR HCL 1 G PO TABS: 1000.0000 mg | ORAL_TABLET | Freq: Every day | ORAL | Status: DC

## 2016-01-10 MED ORDER — LORAZEPAM 2 MG PO TABS: ORAL_TABLET | ORAL | Status: DC

## 2016-01-10 NOTE — Progress Notes (Signed)
Complete Physical  Assessment and Plan: 1. Depression, major, in remission ([place]) lexapro continue it  2. Anxiety Continue lexapro  3. Arthritis PRN  4. Bilateral low back pain without sciatica PRN  5. Insomnia Continue ativan  6. Glaucoma Continue follow up  7. History of squamous cell carcinoma Continue derm follow up q 3 months  8. HSV-1 (herpes simplex virus 1) infection Valtrex PRN, refill today  9. Gastroesophageal reflux disease without esophagitis Continue H2  10. Hypothyroidism, unspecified hypothyroidism type - TSH  11. Vitamin D deficiency - VITAMIN D 25 Hydroxy (Vit-D Deficiency, Fractures)  12. Medication management - CBC with Differential/Platelet - BASIC METABOLIC PANEL WITH GFR - Hepatic function panel - Magnesium  13. Screening cholesterol level - Lipid panel  14. Routine general medical examination at a health care facility - CBC with Differential/Platelet - BASIC METABOLIC PANEL WITH GFR - Hepatic function panel - TSH - Lipid panel - Magnesium - VITAMIN D 25 Hydroxy (Vit-D Deficiency, Fractures) - Urinalysis, Routine w reflex microscopic (not at [facility]) - Microalbumin / creatinine urine ratio  15. Osteopenia Continue vitamin d, weight bearing exercises.   16. Screening for blood or protein in urine - Urinalysis, Routine w reflex microscopic (not at [facility]) - Microalbumin / creatinine urine ratio   Discussed med's effects and SE's. Screening labs and tests as requested with regular follow-up as recommended. Future Appointments Date Time Provider [place]  01/21/2017 2:00 PM Vicie Mutters, PA-C [id number] None    HPI 65 y.o. female  presents for a complete physical. Her blood pressure has been controlled at home, today their BP is BP: 124/66 mmHg She denies chest pain, shortness of breath, dizziness.  Her cholesterol is diet controlled.  Her cholesterol is controlled. The cholesterol last visit was:   Lab Results   Component Value Date   CHOL 234* 01/09/2015   HDL 100 01/09/2015   LDLCALC 114* 01/09/2015   TRIG 98 01/09/2015   CHOLHDL 2.3 01/09/2015  Patient is on Vitamin D supplement.  Lab Results  Component Value Date   VD25OH 65 01/09/2015  She has had a normal A1C.  Lab Results  Component Value Date   HGBA1C 5.1 11/16/2013  History of SCC and melanoma last OV, follows with DERM q 3 months.  She has glacoma and follows with her eye doctor every 3 months.  Patient is on lexapro 20 and states she is doing well with it.  Takes ativan at night for sleep.  She is on estrogen/progresterone cream, gets a combo from [place].  She has history of elevated AST/ALT, negative hepatitis 05/2014.  Lab Results  Component Value Date   ALT 35 01/09/2015   AST 42* 01/09/2015   ALKPHOS 41 01/09/2015   BILITOT 0.5 01/09/2015   She is on thyroid medication. Her medication was not changed last visit.   Lab Results  Component Value Date   TSH 0.760 02/09/2015  .   Current Medications:  Current Outpatient Prescriptions on File Prior to Visit  Medication Sig Dispense Refill  . Cholecalciferol (VITAMIN D PO) Take 1,000 Int'l Units by mouth daily.    . Cyanocobalamin (VITAMIN B-12 PO) Take by mouth daily.    . cyclobenzaprine (FLEXERIL) 10 MG tablet TAKE ONE TABLET BY MOUTH THREE TIMES DAILY AS NEEDED FOR MUSCLE SPASM 90 tablet 0  . escitalopram (LEXAPRO) 20 MG tablet Take 1 tablet (20 mg total) by mouth daily. 90 tablet 2  . levothyroxine (SYNTHROID, LEVOTHROID) 50 MCG tablet Take 1 tablet (50 mcg  total) by mouth daily before breakfast. 90 tablet 3  . LORazepam (ATIVAN) 2 MG tablet TAKE ONE-HALF TO ONE  TABLET BY MOUTH ONCE DAILY AT BEDTIME AS NEEDED 90 tablet 2  . ranitidine (ZANTAC) 300 MG tablet Take twice a day while trying to get off PPI, then can go to once at night 60 tablet 3   No current facility-administered medications on file prior to visit.   Immunization History  Administered Date(s)  Administered  . Influenza Split 07/02/2013, 07/06/2014  . Influenza, High Dose Seasonal PF 06/26/2015  . Pneumococcal Conjugate-13 07/25/2015  . Pneumococcal Polysaccharide-23 02/03/2014  . Tdap 11/13/2012  . Zoster 11/16/2013   Health Maintenance:  Tetanus: 2014 Pneumovax: 2015 Prevnar 13: 2016 Flu vaccine: 2016 Zostavax: 2015 Pap: TAH not needed MGM: 11/2015 DEXA: 11/2015, osteopenia Colonoscopy: 03/2008- repeat 10 years Dr. Olevia Perches EGD: 03/2008   Allergies:  Allergies  Allergen Reactions  . Codeine     REACTION: Itch/nausea/vomiting  . Meperidine Hcl     REACTION: itch  . Morphine     REACTION: tachycardia   Medical History:  Past Medical History  Diagnosis Date  . Depression   . Anxiety   . Glaucoma   . Arthritis   . History of squamous cell carcinoma   . TMJ (temporomandibular joint syndrome)     wears gaurd at night  . GERD (gastroesophageal reflux disease)    Surgical History:  Past Surgical History  Procedure Laterality Date  . Abdominal hysterectomy      age 55  . Breast surgery      implants  . Cervical fusion  2012    Dr. Ellene Route  . Squamous cell carcinoma excision      Multiple  . Anterior cruciate ligament repair Bilateral 1998   Family History:  Family History  Problem Relation Age of Onset  . Hypertension Mother   . Diabetes Mother   . Heart disease Mother   . Glaucoma Mother   . Heart attack Father    Social History:  Social History   Social History  . Marital Status: Married    Spouse Name: N/A  . Number of Children: N/A  . Years of Education: N/A   Occupational History  . Not on file.   Social History Main Topics  . Smoking status: Never Smoker   . Smokeless tobacco: Not on file  . Alcohol Use: Yes     Comment: Occasion  . Drug Use: No  . Sexual Activity: Not on file   Other Topics Concern  . Not on file   Social History Narrative   Review of Systems  Constitutional: Negative.   HENT: Negative.   Eyes:  Negative.   Respiratory: Negative.   Cardiovascular: Negative.   Gastrointestinal: Negative.   Genitourinary: Negative.   Musculoskeletal: Negative.   Skin: Negative.   Neurological: Negative.   Endo/Heme/Allergies: Negative.   Psychiatric/Behavioral: Negative.     Physical Exam: Estimated body mass index is 18.48 kg/(m^2) as calculated from the following:   Height as of this encounter: 5' 6.5" (1.689 m).   Weight as of this encounter: 116 lb 3.2 oz (52.708 kg). Filed Vitals:   01/10/16 1509  BP: 124/66  Pulse: 82  Temp: 97.7 F (36.5 C)  Resp: 16   General Appearance: Well nourished, in no apparent distress. Eyes: PERRLA, EOMs, conjunctiva no swelling or erythema, normal fundi and vessels. Sinuses: No Frontal/maxillary tenderness ENT/Mouth: Ext aud canals clear, normal light reflex with TMs without erythema, bulging.  Good dentition. No erythema, swelling, or exudate on post pharynx. Tonsils not swollen or erythematous. Hearing normal.  Neck: Supple, thyroid normal. No bruits Respiratory: Respiratory effort normal, BS equal bilaterally without rales, rhonchi, wheezing or stridor. Cardio: RRR without murmurs, rubs or gallops. Brisk peripheral pulses without edema.  Chest: symmetric, with normal excursions and percussion. Breasts: Symmetric, without lumps, nipple discharge, retractions. + bilateral implants Abdomen: Soft, +BS. Non tender, no guarding, rebound, hernias, masses, or organomegaly. .  Lymphatics: Non tender without lymphadenopathy.  Genitourinary: defer Musculoskeletal: Full ROM all peripheral extremities,5/5 strength, and normal gait.  Skin: Warm, dry without rashes, lesions, ecchymosis.  Neuro: Cranial nerves intact, reflexes equal bilaterally. Normal muscle tone, no cerebellar symptoms. Sensation intact.  Psych: Awake and oriented X 3, normal affect, Insight and Judgment appropriate.    Vicie Mutters 3:25 PM

## 2016-01-10 NOTE — Patient Instructions (Signed)

## 2016-01-11 LAB — LIPID PANEL
CHOLESTEROL: 248 mg/dL — AB (ref 125–200)
HDL: 107 mg/dL (ref >=46)
LDL Cholesterol: 128 mg/dL (ref <130)
TRIGLYCERIDES: 65 mg/dL (ref <150)
Total CHOL/HDL Ratio: 2.3 Ratio (ref <=5.0)
VLDL: 13 mg/dL (ref <30)

## 2016-01-11 LAB — TSH: TSH: 1.92 m[IU]/L

## 2016-01-11 LAB — HEPATIC FUNCTION PANEL
ALBUMIN: 4.4 g/dL (ref 3.6–5.1)
ALK PHOS: 34 U/L (ref 33–130)
ALT: 29 U/L (ref 6–29)
AST: 33 U/L (ref 10–35)
BILIRUBIN DIRECT: 0.1 mg/dL (ref <=0.2)
BILIRUBIN TOTAL: 0.6 mg/dL (ref 0.2–1.2)
Indirect Bilirubin: 0.5 mg/dL (ref 0.2–1.2)
TOTAL PROTEIN: 6.8 g/dL (ref 6.1–8.1)

## 2016-01-11 LAB — MAGNESIUM: Magnesium: 1.8 mg/dL (ref 1.5–2.5)

## 2016-01-11 LAB — URINALYSIS, ROUTINE W REFLEX MICROSCOPIC
Bilirubin Urine: NEGATIVE
Glucose, UA: NEGATIVE
Hgb urine dipstick: NEGATIVE
KETONES UR: NEGATIVE
Leukocytes, UA: NEGATIVE
NITRITE: NEGATIVE
Protein, ur: NEGATIVE
Specific Gravity, Urine: 1.016 (ref 1.001–1.035)
pH: 5.5 (ref 5.0–8.0)

## 2016-01-11 LAB — BASIC METABOLIC PANEL WITH GFR
BUN: 14 mg/dL (ref 7–25)
CHLORIDE: 102 mmol/L (ref 98–110)
CO2: 24 mmol/L (ref 20–31)
Calcium: 9.2 mg/dL (ref 8.6–10.4)
Creat: 0.71 mg/dL (ref 0.50–0.99)
GFR, Est African American: >89 mL/min (ref >=60)
GFR, Est Non African American: >89 mL/min (ref >=60)
Glucose, Bld: 67 mg/dL (ref 65–99)
POTASSIUM: 3.7 mmol/L (ref 3.5–5.3)
SODIUM: 136 mmol/L (ref 135–146)

## 2016-01-11 LAB — MICROALBUMIN / CREATININE URINE RATIO
Creatinine, Urine: 114 mg/dL (ref 20–320)
MICROALB UR: 0.2 mg/dL
Microalb Creat Ratio: 2 mcg/mg creat (ref <30)

## 2016-01-11 LAB — VITAMIN D 25 HYDROXY (VIT D DEFICIENCY, FRACTURES): Vit D, 25-Hydroxy: 72 ng/mL (ref 30–100)

## 2016-02-12 DIAGNOSIS — H401121 Primary open-angle glaucoma, left eye, mild stage: Secondary | ICD-10-CM | POA: Diagnosis not present

## 2016-02-20 DIAGNOSIS — L570 Actinic keratosis: Secondary | ICD-10-CM | POA: Diagnosis not present

## 2016-02-20 DIAGNOSIS — Z85828 Personal history of other malignant neoplasm of skin: Secondary | ICD-10-CM | POA: Diagnosis not present

## 2016-02-20 DIAGNOSIS — L820 Inflamed seborrheic keratosis: Secondary | ICD-10-CM | POA: Diagnosis not present

## 2016-02-20 DIAGNOSIS — Z85820 Personal history of malignant melanoma of skin: Secondary | ICD-10-CM | POA: Diagnosis not present

## 2016-02-20 DIAGNOSIS — D225 Melanocytic nevi of trunk: Secondary | ICD-10-CM | POA: Diagnosis not present

## 2016-02-20 DIAGNOSIS — L821 Other seborrheic keratosis: Secondary | ICD-10-CM | POA: Diagnosis not present

## 2016-02-20 DIAGNOSIS — L905 Scar conditions and fibrosis of skin: Secondary | ICD-10-CM | POA: Diagnosis not present

## 2016-02-20 DIAGNOSIS — D692 Other nonthrombocytopenic purpura: Secondary | ICD-10-CM | POA: Diagnosis not present

## 2016-02-20 DIAGNOSIS — D485 Neoplasm of uncertain behavior of skin: Secondary | ICD-10-CM | POA: Diagnosis not present

## 2016-03-06 ENCOUNTER — Other Ambulatory Visit: Payer: Self-pay | Admitting: Internal Medicine

## 2016-04-26 ENCOUNTER — Other Ambulatory Visit: Payer: Self-pay | Admitting: Physician Assistant

## 2016-05-28 DIAGNOSIS — L821 Other seborrheic keratosis: Secondary | ICD-10-CM | POA: Diagnosis not present

## 2016-05-28 DIAGNOSIS — Z85820 Personal history of malignant melanoma of skin: Secondary | ICD-10-CM | POA: Diagnosis not present

## 2016-05-28 DIAGNOSIS — L570 Actinic keratosis: Secondary | ICD-10-CM | POA: Diagnosis not present

## 2016-05-28 DIAGNOSIS — D692 Other nonthrombocytopenic purpura: Secondary | ICD-10-CM | POA: Diagnosis not present

## 2016-05-28 DIAGNOSIS — L910 Hypertrophic scar: Secondary | ICD-10-CM | POA: Diagnosis not present

## 2016-05-28 DIAGNOSIS — Z85828 Personal history of other malignant neoplasm of skin: Secondary | ICD-10-CM | POA: Diagnosis not present

## 2016-05-28 DIAGNOSIS — D2261 Melanocytic nevi of right upper limb, including shoulder: Secondary | ICD-10-CM | POA: Diagnosis not present

## 2016-05-28 DIAGNOSIS — D2272 Melanocytic nevi of left lower limb, including hip: Secondary | ICD-10-CM | POA: Diagnosis not present

## 2016-05-28 DIAGNOSIS — D225 Melanocytic nevi of trunk: Secondary | ICD-10-CM | POA: Diagnosis not present

## 2016-06-06 DIAGNOSIS — H524 Presbyopia: Secondary | ICD-10-CM | POA: Diagnosis not present

## 2016-07-11 ENCOUNTER — Ambulatory Visit (INDEPENDENT_AMBULATORY_CARE_PROVIDER_SITE_OTHER): Payer: PPO

## 2016-07-11 DIAGNOSIS — Z23 Encounter for immunization: Secondary | ICD-10-CM | POA: Diagnosis not present

## 2016-07-25 ENCOUNTER — Encounter: Payer: Self-pay | Admitting: Internal Medicine

## 2016-07-25 ENCOUNTER — Ambulatory Visit (INDEPENDENT_AMBULATORY_CARE_PROVIDER_SITE_OTHER): Payer: PPO | Admitting: Internal Medicine

## 2016-07-25 VITALS — BP 122/76 | HR 64 | Temp 98.2°F | Resp 16 | Ht 66.5 in | Wt 112.0 lb

## 2016-07-25 DIAGNOSIS — M8589 Other specified disorders of bone density and structure, multiple sites: Secondary | ICD-10-CM

## 2016-07-25 DIAGNOSIS — F419 Anxiety disorder, unspecified: Secondary | ICD-10-CM

## 2016-07-25 DIAGNOSIS — G4700 Insomnia, unspecified: Secondary | ICD-10-CM

## 2016-07-25 DIAGNOSIS — B009 Herpesviral infection, unspecified: Secondary | ICD-10-CM

## 2016-07-25 DIAGNOSIS — Z79899 Other long term (current) drug therapy: Secondary | ICD-10-CM | POA: Diagnosis not present

## 2016-07-25 DIAGNOSIS — F325 Major depressive disorder, single episode, in full remission: Secondary | ICD-10-CM | POA: Diagnosis not present

## 2016-07-25 DIAGNOSIS — R6889 Other general symptoms and signs: Secondary | ICD-10-CM

## 2016-07-25 DIAGNOSIS — K219 Gastro-esophageal reflux disease without esophagitis: Secondary | ICD-10-CM

## 2016-07-25 DIAGNOSIS — E039 Hypothyroidism, unspecified: Secondary | ICD-10-CM | POA: Diagnosis not present

## 2016-07-25 DIAGNOSIS — M1990 Unspecified osteoarthritis, unspecified site: Secondary | ICD-10-CM

## 2016-07-25 DIAGNOSIS — Z0001 Encounter for general adult medical examination with abnormal findings: Secondary | ICD-10-CM

## 2016-07-25 DIAGNOSIS — Z0000 Encounter for general adult medical examination without abnormal findings: Secondary | ICD-10-CM

## 2016-07-25 DIAGNOSIS — M545 Low back pain, unspecified: Secondary | ICD-10-CM

## 2016-07-25 DIAGNOSIS — Z8589 Personal history of malignant neoplasm of other organs and systems: Secondary | ICD-10-CM

## 2016-07-25 DIAGNOSIS — H409 Unspecified glaucoma: Secondary | ICD-10-CM

## 2016-07-25 LAB — BASIC METABOLIC PANEL WITH GFR
BUN: 18 mg/dL (ref 7–25)
CO2: 27 mmol/L (ref 20–31)
Calcium: 8.9 mg/dL (ref 8.6–10.4)
Chloride: 105 mmol/L (ref 98–110)
Creat: 0.71 mg/dL (ref 0.50–0.99)
GFR, Est African American: >89 mL/min (ref >=60)
GFR, Est Non African American: 89 mL/min (ref >=60)
Glucose, Bld: 84 mg/dL (ref 65–99)
Potassium: 4.4 mmol/L (ref 3.5–5.3)
Sodium: 137 mmol/L (ref 135–146)

## 2016-07-25 LAB — CBC WITH DIFFERENTIAL/PLATELET
Basophils Absolute: 0 cells/uL (ref 0–200)
Basophils Relative: 0 %
Eosinophils Absolute: 210 cells/uL (ref 15–500)
Eosinophils Relative: 5 %
HCT: 40.8 % (ref 35.0–45.0)
Hemoglobin: 13.5 g/dL (ref 11.7–15.5)
Lymphocytes Relative: 40 %
Lymphs Abs: 1680 cells/uL (ref 850–3900)
MCH: 35.7 pg — ABNORMAL HIGH (ref 27.0–33.0)
MCHC: 33.1 g/dL (ref 32.0–36.0)
MCV: 107.9 fL — ABNORMAL HIGH (ref 80.0–100.0)
MPV: 9 fL (ref 7.5–12.5)
Monocytes Absolute: 336 cells/uL (ref 200–950)
Monocytes Relative: 8 %
Neutro Abs: 1974 cells/uL (ref 1500–7800)
Neutrophils Relative %: 47 %
Platelets: 250 10*3/uL (ref 140–400)
RBC: 3.78 MIL/uL — ABNORMAL LOW (ref 3.80–5.10)
RDW: 13.2 % (ref 11.0–15.0)
WBC: 4.2 10*3/uL (ref 3.8–10.8)

## 2016-07-25 LAB — HEPATIC FUNCTION PANEL
ALT: 32 U/L — AB (ref 6–29)
AST: 35 U/L (ref 10–35)
Albumin: 4.1 g/dL (ref 3.6–5.1)
Alkaline Phosphatase: 44 U/L (ref 33–130)
BILIRUBIN INDIRECT: 0.4 mg/dL (ref 0.2–1.2)
Bilirubin, Direct: 0.1 mg/dL (ref <=0.2)
Total Bilirubin: 0.5 mg/dL (ref 0.2–1.2)
Total Protein: 6.3 g/dL (ref 6.1–8.1)

## 2016-07-25 LAB — TSH: TSH: 1.22 mIU/L

## 2016-07-25 MED ORDER — LORAZEPAM 2 MG PO TABS: ORAL_TABLET | ORAL | 2 refills | Status: DC

## 2016-07-25 MED ORDER — RANITIDINE HCL 300 MG PO TABS: ORAL_TABLET | ORAL | 3 refills | Status: DC

## 2016-07-25 MED ORDER — CYCLOBENZAPRINE HCL 10 MG PO TABS: 10.0000 mg | ORAL_TABLET | Freq: Three times a day (TID) | ORAL | 3 refills | Status: DC | PRN

## 2016-07-25 MED ORDER — ESCITALOPRAM OXALATE 20 MG PO TABS: 20.0000 mg | ORAL_TABLET | Freq: Every day | ORAL | 0 refills | Status: DC

## 2016-07-25 NOTE — Progress Notes (Signed)
MEDICARE ANNUAL WELLNESS VISIT AND FOLLOW UP  Assessment:    1. Gastroesophageal reflux disease without esophagitis -can increase to 300 mg BID - ranitidine (ZANTAC) 300 MG tablet; Take twice a day while trying to get off PPI, then can go to once at night  Dispense: 60 tablet; Refill: 3  2. Hypothyroidism, unspecified type -cont levothyroxine - TSH  3. Arthritis -cont meloxicam prn  4. Osteopenia of multiple sites -cont Vit D and calcium supplements  5. Anxiety -cont exercise -cont meds  6. Depression, major, in remission ([place]) -cont meds - LORazepam (ATIVAN) 2 MG tablet; TAKE ONE-HALF TO ONE  TABLET BY MOUTH ONCE DAILY AT BEDTIME AS NEEDED  Dispense: 90 tablet; Refill: 2  7. Encounter for Medicare annual wellness exam -due next year  8. Glaucoma, unspecified glaucoma type, unspecified laterality -cont eye doctor visits  9. History of squamous cell carcinoma -cont visits to dermatology  10. HSV-1 (herpes simplex virus 1) infection -valtrex or acyclovir prn  11. Insomnia, unspecified type  - LORazepam (ATIVAN) 2 MG tablet; TAKE ONE-HALF TO ONE  TABLET BY MOUTH ONCE DAILY AT BEDTIME AS NEEDED  Dispense: 90 tablet; Refill: 2  12. Bilateral low back pain without sciatica, unspecified chronicity -cont exercise -cont weight management -meloxicam prn  13. Medication management  - CBC with Differential/Platelet - BASIC METABOLIC PANEL WITH GFR - Hepatic function panel   Over 30 minutes of exam, counseling, chart review, and critical decision making was performed  Future Appointments Date Time Provider [place]  01/21/2017 2:00 PM Vicie Mutters, PA-C [facility] None    Plan:   During the course of the visit the patient was educated and counseled about appropriate screening and preventive services including:    Pneumococcal vaccine   Influenza vaccine  Td vaccine  Prevnar 13  Screening electrocardiogram  Screening mammography  Bone  densitometry screening  Colorectal cancer screening  Diabetes screening  Glaucoma screening  Nutrition counseling   Advanced directives: given info/requested copies   Subjective:   Breanna Sparks is a 65 y.o. female who presents for Medicare Annual Wellness Visit and 3 month follow up on hypertension, prediabetes, hyperlipidemia, vitamin D def.   Her blood pressure has been controlled at home, today their BP is BP: 122/76 She does workout. She denies chest pain, shortness of breath, dizziness.   She is on cholesterol medication and denies myalgias. Her cholesterol is at goal. The cholesterol last visit was:   Lab Results  Component Value Date   CHOL 248 (H) 01/10/2016   HDL 107 01/10/2016   LDLCALC 128 01/10/2016   TRIG 65 01/10/2016   CHOLHDL 2.3 01/10/2016   She has a history of well controlled A1C levels secondary to excellent diet and exercise. Lab Results  Component Value Date   HGBA1C 5.1 11/16/2013   Last GFR Lab Results  Component Value Date   GFRNONAA 89 07/25/2016   Lab Results  Component Value Date   GFRAA >89 07/25/2016   Patient is on Vitamin D supplement. Lab Results  Component Value Date   VD25OH 72 01/10/2016     She reports that she has been working really hard on her carbohydrates.  She has really cut down a lot on carbs.  She reports that they have moved to more whole grains and cut out the carbs at night.  She reports that it has helped a lot.     Medication Review Current Outpatient Prescriptions on File Prior to Visit  Medication Sig Dispense Refill  .  Cholecalciferol (VITAMIN D PO) Take 1,000 Int'l Units by mouth daily.    . Cyanocobalamin (VITAMIN B-12 PO) Take by mouth daily.    Marland Kitchen levothyroxine (SYNTHROID, LEVOTHROID) 50 MCG tablet Take 1 tablet (50 mcg total) by mouth daily before breakfast. 90 tablet 3   No current facility-administered medications on file prior to visit.     Allergies: Allergies  Allergen Reactions  .  Codeine     REACTION: Itch/nausea/vomiting  . Meperidine Hcl     REACTION: itch  . Morphine     REACTION: tachycardia    Current Problems (verified) has Depression, major, in remission ([place]); Anxiety; Glaucoma; Arthritis; History of squamous cell carcinoma; Lower back pain; Insomnia; HSV-1 (herpes simplex virus 1) infection; Encounter for Medicare annual wellness exam; Gastroesophageal reflux disease without esophagitis; Thyroid activity decreased; and Osteopenia on her problem list.  Screening Tests Immunization History  Administered Date(s) Administered  . Influenza Split 07/02/2013, 07/06/2014  . Influenza, High Dose Seasonal PF 06/26/2015, 07/11/2016  . Pneumococcal Conjugate-13 07/25/2015  . Pneumococcal Polysaccharide-23 02/03/2014  . Tdap 11/13/2012  . Zoster 11/16/2013    Preventative care: Last colonoscopy: 2009 Last mammogram: 2017 Last pap smear/pelvic exam: Hystectomy   DEXA:2017   Prior vaccinations: TD or Tdap: 2014  Influenza: 2017  Pneumococcal: 2015 Prevnar13: 2016 Shingles/Zostavax: 2015  Names of Other Physician/Practitioners you currently use: 1. [facility]- here for primary care 2. Dr. Nicki Reaper, eye doctor, last visit goes every 3 months 3. Dr. Mina Marble, dentist, last visit twice yearly Patient Care Team: Unk Pinto, MD as PCP - General (Internal Medicine) Lafayette Dragon, MD (Inactive) as Consulting Physician (Gastroenterology) Macarthur Critchley, [place] as Referring Physician (Optometry) Amy Martinique, MD as Consulting Physician (Dermatology) Kristeen Miss, MD as Consulting Physician (Neurosurgery)  Surgical: She  has a past surgical history that includes Abdominal hysterectomy; Breast surgery; Cervical fusion (2012); Squamous cell carcinoma excision; and Anterior cruciate ligament repair (Bilateral, 1998). Family Her family history includes Diabetes in her mother; Glaucoma in her mother; Heart attack in her father; Heart  disease in her mother; Hypertension in her mother. Social history  She reports that she has never smoked. She does not have any smokeless tobacco history on file. She reports that she drinks alcohol. She reports that she does not use drugs.  MEDICARE WELLNESS OBJECTIVES: Physical activity: Current Exercise Habits: Home exercise routine, Type of exercise: strength training/weights, Time (Minutes): > 60, Frequency (Times/Week): 4, Weekly Exercise (Minutes/Week): 0, Intensity: Moderate Cardiac risk factors: Cardiac Risk Factors include: advanced age (>16men, >57 women);hypertension Depression/mood screen:   Depression screen [facility] 2/9 07/25/2016  Decreased Interest 0  Down, Depressed, Hopeless 0  PHQ - 2 Score 0    ADLs:  In your present state of health, do you have any difficulty performing the following activities: 07/25/2016  Hearing? N  Vision? N  Difficulty concentrating or making decisions? N  Walking or climbing stairs? N  Dressing or bathing? N  Doing errands, shopping? N  Preparing Food and eating ? N  Using the Toilet? N  In the past six months, have you accidently leaked urine? N  Do you have problems with loss of bowel control? N  Managing your Medications? N  Managing your Finances? N  Housekeeping or managing your Housekeeping? N  Some recent data might be hidden     Cognitive Testing  Alert? Yes  Normal Appearance?Yes  Oriented to person? Yes  Place? Yes   Time? Yes  Recall of three objects?  Yes  Can perform simple calculations? Yes  Displays appropriate judgment?Yes  Can read the correct time from a watch face?Yes  EOL planning: Does patient have an advance directive?: Yes Type of Advance Directive: Healthcare Power of Attorney, Living will Does patient want to make changes to advanced directive?: No - Patient declined Copy of advanced directive(s) in chart?: No - copy requested   Objective:   Today's Vitals   07/25/16 1018  BP: 122/76  Pulse: 64  Resp:  16  Temp: 98.2 F (36.8 C)  TempSrc: Temporal  Weight: 112 lb (50.8 kg)  Height: 5' 6.5" (1.689 m)   Body mass index is 17.81 kg/m.  General appearance: alert, no distress, WD/WN,  female HEENT: normocephalic, sclerae anicteric, TMs pearly, nares patent, no discharge or erythema, pharynx normal Oral cavity: MMM, no lesions Neck: supple, no lymphadenopathy, no thyromegaly, no masses Heart: RRR, normal S1, S2, no murmurs Lungs: CTA bilaterally, no wheezes, rhonchi, or rales Abdomen: +bs, soft, non tender, non distended, no masses, no hepatomegaly, no splenomegaly Musculoskeletal: nontender, no swelling, no obvious deformity Extremities: no edema, no cyanosis, no clubbing Pulses: 2+ symmetric, upper and lower extremities, normal cap refill Neurological: alert, oriented x 3, CN2-12 intact, strength normal upper extremities and lower extremities, sensation normal throughout, DTRs 2+ throughout, no cerebellar signs, gait normal Psychiatric: normal affect, behavior normal, pleasant  Breast: defer Gyn: defer Rectal: defer   Medicare Attestation I have personally reviewed: The patient's medical and social history Their use of alcohol, tobacco or illicit drugs Their current medications and supplements The patient's functional ability including ADLs,fall risks, home safety risks, cognitive, and hearing and visual impairment Diet and physical activities Evidence for depression or mood disorders  The patient's weight, height, BMI, and visual acuity have been recorded in the chart.  I have made referrals, counseling, and provided education to the patient based on review of the above and I have provided the patient with a written personalized care plan for preventive services.     Starlyn Skeans, PA-C   07/25/2016

## 2016-07-30 ENCOUNTER — Other Ambulatory Visit: Payer: PPO

## 2016-07-30 DIAGNOSIS — D7589 Other specified diseases of blood and blood-forming organs: Secondary | ICD-10-CM | POA: Diagnosis not present

## 2016-07-30 DIAGNOSIS — E538 Deficiency of other specified B group vitamins: Secondary | ICD-10-CM | POA: Diagnosis not present

## 2016-07-30 DIAGNOSIS — D649 Anemia, unspecified: Secondary | ICD-10-CM | POA: Diagnosis not present

## 2016-07-30 LAB — CBC WITH DIFFERENTIAL/PLATELET
Basophils Absolute: 0 cells/uL (ref 0–200)
Basophils Relative: 0 %
EOS PCT: 4 %
Eosinophils Absolute: 180 cells/uL (ref 15–500)
HCT: 41.1 % (ref 35.0–45.0)
Hemoglobin: 13.6 g/dL (ref 11.7–15.5)
LYMPHS PCT: 38 %
Lymphs Abs: 1710 cells/uL (ref 850–3900)
MCH: 35.8 pg — ABNORMAL HIGH (ref 27.0–33.0)
MCHC: 33.1 g/dL (ref 32.0–36.0)
MCV: 108.2 fL — ABNORMAL HIGH (ref 80.0–100.0)
MPV: 8.7 fL (ref 7.5–12.5)
Monocytes Absolute: 405 cells/uL (ref 200–950)
Monocytes Relative: 9 %
NEUTROS PCT: 49 %
Neutro Abs: 2205 cells/uL (ref 1500–7800)
Platelets: 239 10*3/uL (ref 140–400)
RBC: 3.8 MIL/uL (ref 3.80–5.10)
RDW: 13 % (ref 11.0–15.0)
WBC: 4.5 10*3/uL (ref 3.8–10.8)

## 2016-07-30 LAB — IRON AND TIBC
%SAT: 40 % (ref 11–50)
IRON: 119 ug/dL (ref 45–160)
TIBC: 300 ug/dL (ref 250–450)
UIBC: 181 ug/dL (ref 125–400)

## 2016-07-30 LAB — RETICULOCYTES
ABS RETIC: 64600 {cells}/uL (ref 20000–80000)
RBC.: 3.8 MIL/uL (ref 3.80–5.10)
RETIC CT PCT: 1.7 %

## 2016-07-30 LAB — VITAMIN B12: Vitamin B-12: 814 pg/mL (ref 200–1100)

## 2016-07-31 LAB — FOLATE RBC: RBC FOLATE: 1037 ng/mL (ref >280)

## 2016-08-02 LAB — METHYLMALONIC ACID, SERUM: Methylmalonic Acid, Quant: 139 nmol/L (ref 87–318)

## 2016-08-13 ENCOUNTER — Other Ambulatory Visit: Payer: PPO

## 2016-08-13 DIAGNOSIS — R7989 Other specified abnormal findings of blood chemistry: Secondary | ICD-10-CM

## 2016-08-15 LAB — PROTEIN ELECTROPHORESIS, SERUM, WITH REFLEX
ALBUMIN ELP: 4.1 g/dL (ref 3.8–4.8)
ALPHA-2-GLOBULIN: 0.5 g/dL (ref 0.5–0.9)
Alpha-1-Globulin: 0.2 g/dL (ref 0.2–0.3)
Beta 2: 0.3 g/dL (ref 0.2–0.5)
Beta Globulin: 0.4 g/dL (ref 0.4–0.6)
Gamma Globulin: 0.8 g/dL (ref 0.8–1.7)
Total Protein, Serum Electrophoresis: 6.2 g/dL (ref 6.1–8.1)

## 2016-08-15 LAB — HEMOGLOBINOPATHY EVALUATION
HEMATOCRIT: 38.5 % (ref 35.0–45.0)
HGB A: 97.1 % (ref >96.0)
Hemoglobin: 12.6 g/dL (ref 11.7–15.5)
Hgb A2 Quant: 1.9 % (ref 1.8–3.5)
Hgb F Quant: <1 % (ref <2.0)
MCH: 35.4 pg — AB (ref 27.0–33.0)
MCV: 108.1 fL — AB (ref 80.0–100.0)
RBC: 3.56 MIL/uL — AB (ref 3.80–5.10)
RDW: 12.8 % (ref 11.0–15.0)

## 2016-08-27 DIAGNOSIS — Z85820 Personal history of malignant melanoma of skin: Secondary | ICD-10-CM | POA: Diagnosis not present

## 2016-08-27 DIAGNOSIS — D2272 Melanocytic nevi of left lower limb, including hip: Secondary | ICD-10-CM | POA: Diagnosis not present

## 2016-08-27 DIAGNOSIS — D2261 Melanocytic nevi of right upper limb, including shoulder: Secondary | ICD-10-CM | POA: Diagnosis not present

## 2016-08-27 DIAGNOSIS — L570 Actinic keratosis: Secondary | ICD-10-CM | POA: Diagnosis not present

## 2016-08-27 DIAGNOSIS — L821 Other seborrheic keratosis: Secondary | ICD-10-CM | POA: Diagnosis not present

## 2016-08-27 DIAGNOSIS — D225 Melanocytic nevi of trunk: Secondary | ICD-10-CM | POA: Diagnosis not present

## 2016-08-27 DIAGNOSIS — Z85828 Personal history of other malignant neoplasm of skin: Secondary | ICD-10-CM | POA: Diagnosis not present

## 2016-09-03 DIAGNOSIS — H01113 Allergic dermatitis of right eye, unspecified eyelid: Secondary | ICD-10-CM | POA: Diagnosis not present

## 2016-09-03 DIAGNOSIS — H01116 Allergic dermatitis of left eye, unspecified eyelid: Secondary | ICD-10-CM | POA: Diagnosis not present

## 2016-09-25 ENCOUNTER — Other Ambulatory Visit: Payer: Self-pay | Admitting: Physician Assistant

## 2016-09-25 DIAGNOSIS — E039 Hypothyroidism, unspecified: Secondary | ICD-10-CM

## 2016-11-01 ENCOUNTER — Other Ambulatory Visit: Payer: Self-pay | Admitting: Internal Medicine

## 2016-12-18 ENCOUNTER — Other Ambulatory Visit: Payer: Self-pay | Admitting: Internal Medicine

## 2016-12-18 DIAGNOSIS — Z1231 Encounter for screening mammogram for malignant neoplasm of breast: Secondary | ICD-10-CM

## 2017-01-09 ENCOUNTER — Ambulatory Visit
Admission: RE | Admit: 2017-01-09 | Discharge: 2017-01-09 | Disposition: A | Discharge status: Home / Self Care | Payer: PPO | Source: Ambulatory Visit | Attending: Internal Medicine | Admitting: Internal Medicine

## 2017-01-09 DIAGNOSIS — Z1231 Encounter for screening mammogram for malignant neoplasm of breast: Secondary | ICD-10-CM

## 2017-01-14 DIAGNOSIS — Z85820 Personal history of malignant melanoma of skin: Secondary | ICD-10-CM | POA: Diagnosis not present

## 2017-01-14 DIAGNOSIS — L710 Perioral dermatitis: Secondary | ICD-10-CM | POA: Diagnosis not present

## 2017-01-14 DIAGNOSIS — D0472 Carcinoma in situ of skin of left lower limb, including hip: Secondary | ICD-10-CM | POA: Diagnosis not present

## 2017-01-14 DIAGNOSIS — Z85828 Personal history of other malignant neoplasm of skin: Secondary | ICD-10-CM | POA: Diagnosis not present

## 2017-01-14 DIAGNOSIS — L570 Actinic keratosis: Secondary | ICD-10-CM | POA: Diagnosis not present

## 2017-01-20 NOTE — Progress Notes (Signed)
Complete Physical  Assessment and Plan: Depression, major, in remission ([place]) lexapro continue it  Anxiety Continue lexapro   Arthritis PRN  Bilateral low back pain without sciatica PRN   Insomnia Continue ativan  Glaucoma Continue follow up  History of squamous cell carcinoma Continue derm follow up q 3 months  HSV-1 (herpes simplex virus 1) infection Valtrex PRN, refill today  Gastroesophageal reflux disease without esophagitis Continue H2   Hypothyroidism, unspecified hypothyroidism type Hypothyroidism-check TSH level, continue medications the same, reminded to take on an empty stomach 30-21mins before food.  - TSH  Vitamin D deficiency - VITAMIN D 25 Hydroxy (Vit-D Deficiency, Fractures)   Medication management - CBC with Differential/Platelet - BASIC METABOLIC PANEL WITH GFR - Hepatic function panel - Magnesium  Screening cholesterol level - Lipid panel   Routine general medical examination at a health care facility   Osteopenia Continue vitamin d, weight bearing exercises, due 2019   Screening for blood or protein in urine - Urinalysis, Routine w reflex microscopic (not at [facility]) - Microalbumin / creatinine urine ratio   Discussed med's effects and SE's. Screening labs and tests as requested with regular follow-up as recommended. Future Appointments Date Time Provider [place]  01/21/2018 2:00 PM Vicie Mutters, PA-C [facility] None    HPI 66 y.o. female  presents for a complete physical. Her blood pressure has been controlled at home, today their BP is BP: 120/74 She denies chest pain, shortness of breath, dizziness.  Her cholesterol is diet controlled.  Her cholesterol is controlled. The cholesterol last visit was:   Lab Results  Component Value Date   CHOL 248 (H) 01/10/2016   HDL 107 01/10/2016   LDLCALC 128 01/10/2016   TRIG 65 01/10/2016   CHOLHDL 2.3 01/10/2016  Patient is on Vitamin D supplement.  Lab Results  Component  Value Date   VD25OH 72 01/10/2016  She has had a normal A1C.  Lab Results  Component Value Date   HGBA1C 5.1 11/16/2013  History of SCC and melanoma last OV, follows with DERM q 3 months, had recent removal of every 2.  She has glacoma and follows with her eye doctor every 6 months.  Patient is on lexapro 20 and states she is doing well with it.  Takes ativan at night for sleep.  She is on estrogen/progresterone cream, gets a combo from [place].  She has history of elevated AST/ALT, negative hepatitis 05/2014.  Lab Results  Component Value Date   ALT 32 (H) 07/25/2016   AST 35 07/25/2016   ALKPHOS 44 07/25/2016   BILITOT 0.5 07/25/2016   She is on thyroid medication. Her medication was not changed last visit.   Lab Results  Component Value Date   TSH 1.22 07/25/2016  .   Current Medications:  Current Outpatient Prescriptions on File Prior to Visit  Medication Sig Dispense Refill  . Cholecalciferol (VITAMIN D PO) Take 1,000 Int'l Units by mouth daily.    . Cyanocobalamin (VITAMIN B-12 PO) Take by mouth daily.    . cyclobenzaprine (FLEXERIL) 10 MG tablet Take 1 tablet (10 mg total) by mouth 3 (three) times daily as needed. for muscle spams 90 tablet 3  . escitalopram (LEXAPRO) 20 MG tablet TAKE ONE TABLET BY MOUTH ONCE DAILY 90 tablet 0  . levothyroxine (SYNTHROID, LEVOTHROID) 50 MCG tablet TAKE ONE TABLET BY MOUTH ONCE DAILY BEFORE BREAKFAST 90 tablet 3  . LORazepam (ATIVAN) 2 MG tablet TAKE ONE-HALF TO ONE  TABLET BY MOUTH ONCE DAILY AT  BEDTIME AS NEEDED 90 tablet 2  . ranitidine (ZANTAC) 300 MG tablet Take twice a day while trying to get off PPI, then can go to once at night 60 tablet 3   No current facility-administered medications on file prior to visit.    Immunization History  Administered Date(s) Administered  . Influenza Split 07/02/2013, 07/06/2014  . Influenza, High Dose Seasonal PF 06/26/2015, 07/11/2016  . Pneumococcal Conjugate-13 07/25/2015  . Pneumococcal  Polysaccharide-23 02/03/2014  . Tdap 11/13/2012  . Zoster 11/16/2013   Health Maintenance:  Tetanus: 2014 Pneumovax: 2015 Prevnar 13: 2016 Flu vaccine: 2017 Zostavax: 2015  Pap: TAH not needed MGM: 01/2017 DEXA: 11/2015, osteopenia due 2019 Colonoscopy: 03/2008- repeat 10 years Dr. Olevia Perches EGD: 03/2008   Medical History: Patient Active Problem List   Diagnosis Date Noted  . Gastroesophageal reflux disease without esophagitis 01/10/2016  . Thyroid activity decreased 01/10/2016  . Osteopenia 01/10/2016  . Encounter for Medicare annual wellness exam 07/25/2015  . Lower back pain 01/09/2015  . Insomnia 01/09/2015  . HSV-1 (herpes simplex virus 1) infection 01/09/2015  . Depression, major, in remission ([place])   . Anxiety   . Glaucoma   . Arthritis   . History of squamous cell carcinoma    Allergies Allergies  Allergen Reactions  . Codeine     REACTION: Itch/nausea/vomiting  . Meperidine Hcl     REACTION: itch  . Morphine     REACTION: tachycardia    SURGICAL HISTORY She  has a past surgical history that includes Abdominal hysterectomy; Breast surgery; Cervical fusion (2012); Squamous cell carcinoma excision; Anterior cruciate ligament repair (Bilateral, 1998); and Augmentation mammaplasty (Bilateral). FAMILY HISTORY Her family history includes Diabetes in her mother; Glaucoma in her mother; Heart attack in her father; Heart disease in her mother; Hypertension in her mother. SOCIAL HISTORY She  reports that she has never smoked. She has never used smokeless tobacco. She reports that she drinks alcohol. She reports that she does not use drugs.    Review of Systems  Constitutional: Negative.   HENT: Negative.   Eyes: Negative.   Respiratory: Negative.   Cardiovascular: Negative.   Gastrointestinal: Negative.   Genitourinary: Negative.   Musculoskeletal: Negative.   Skin: Negative.   Neurological: Negative.   Endo/Heme/Allergies: Negative.    Psychiatric/Behavioral: Negative.     Physical Exam: Estimated body mass index is 18.3 kg/m as calculated from the following:   Height as of this encounter: 5\' 6"  (1.676 m).   Weight as of this encounter: 113 lb 6.4 oz (51.4 kg). Vitals:   01/21/17 1401  BP: 120/74  Pulse: 87  Resp: 16  Temp: 97.9 F (36.6 C)   General Appearance: Well nourished, in no apparent distress. Eyes: PERRLA, EOMs, conjunctiva no swelling or erythema, normal fundi and vessels. Sinuses: No Frontal/maxillary tenderness ENT/Mouth: Ext aud canals clear, normal light reflex with TMs without erythema, bulging.  Good dentition. No erythema, swelling, or exudate on post pharynx. Tonsils not swollen or erythematous. Hearing normal.  Neck: Supple, thyroid normal. No bruits Respiratory: Respiratory effort normal, BS equal bilaterally without rales, rhonchi, wheezing or stridor. Cardio: RRR without murmurs, rubs or gallops. Brisk peripheral pulses without edema.  Chest: symmetric, with normal excursions and percussion. Breasts: Symmetric, without lumps, nipple discharge, retractions. + bilateral implants Abdomen: Soft, +BS. Non tender, no guarding, rebound, hernias, masses, or organomegaly. .  Lymphatics: Non tender without lymphadenopathy.  Genitourinary: defer Musculoskeletal: Full ROM all peripheral extremities,5/5 strength, and normal gait.  Skin: Warm, dry without  rashes, lesions, ecchymosis.  Neuro: Cranial nerves intact, reflexes equal bilaterally. Normal muscle tone, no cerebellar symptoms. Sensation intact.  Psych: Awake and oriented X 3, normal affect, Insight and Judgment appropriate.    Vicie Mutters 2:16 PM

## 2017-01-21 ENCOUNTER — Ambulatory Visit (INDEPENDENT_AMBULATORY_CARE_PROVIDER_SITE_OTHER): Payer: PPO | Admitting: Physician Assistant

## 2017-01-21 ENCOUNTER — Encounter: Payer: Self-pay | Admitting: Physician Assistant

## 2017-01-21 VITALS — BP 120/74 | HR 87 | Temp 97.9°F | Resp 16 | Ht 66.0 in | Wt 113.4 lb

## 2017-01-21 DIAGNOSIS — G4700 Insomnia, unspecified: Secondary | ICD-10-CM

## 2017-01-21 DIAGNOSIS — H409 Unspecified glaucoma: Secondary | ICD-10-CM

## 2017-01-21 DIAGNOSIS — Z136 Encounter for screening for cardiovascular disorders: Secondary | ICD-10-CM | POA: Diagnosis not present

## 2017-01-21 DIAGNOSIS — Z13220 Encounter for screening for lipoid disorders: Secondary | ICD-10-CM | POA: Diagnosis not present

## 2017-01-21 DIAGNOSIS — F419 Anxiety disorder, unspecified: Secondary | ICD-10-CM

## 2017-01-21 DIAGNOSIS — M1990 Unspecified osteoarthritis, unspecified site: Secondary | ICD-10-CM

## 2017-01-21 DIAGNOSIS — E039 Hypothyroidism, unspecified: Secondary | ICD-10-CM

## 2017-01-21 DIAGNOSIS — I10 Essential (primary) hypertension: Secondary | ICD-10-CM

## 2017-01-21 DIAGNOSIS — Z1389 Encounter for screening for other disorder: Secondary | ICD-10-CM | POA: Diagnosis not present

## 2017-01-21 DIAGNOSIS — Z79899 Other long term (current) drug therapy: Secondary | ICD-10-CM

## 2017-01-21 DIAGNOSIS — K219 Gastro-esophageal reflux disease without esophagitis: Secondary | ICD-10-CM

## 2017-01-21 DIAGNOSIS — B009 Herpesviral infection, unspecified: Secondary | ICD-10-CM

## 2017-01-21 DIAGNOSIS — M545 Low back pain, unspecified: Secondary | ICD-10-CM

## 2017-01-21 DIAGNOSIS — Z8589 Personal history of malignant neoplasm of other organs and systems: Secondary | ICD-10-CM

## 2017-01-21 DIAGNOSIS — E559 Vitamin D deficiency, unspecified: Secondary | ICD-10-CM | POA: Diagnosis not present

## 2017-01-21 DIAGNOSIS — Z0000 Encounter for general adult medical examination without abnormal findings: Secondary | ICD-10-CM | POA: Diagnosis not present

## 2017-01-21 DIAGNOSIS — F325 Major depressive disorder, single episode, in full remission: Secondary | ICD-10-CM

## 2017-01-21 DIAGNOSIS — M8589 Other specified disorders of bone density and structure, multiple sites: Secondary | ICD-10-CM

## 2017-01-21 LAB — CBC WITH DIFFERENTIAL/PLATELET
Basophils Absolute: 0 cells/uL (ref 0–200)
Basophils Relative: 0 %
Eosinophils Absolute: 250 cells/uL (ref 15–500)
Eosinophils Relative: 5 %
HEMATOCRIT: 39.5 % (ref 35.0–45.0)
Hemoglobin: 13.1 g/dL (ref 11.7–15.5)
LYMPHS PCT: 37 %
Lymphs Abs: 1850 cells/uL (ref 850–3900)
MCH: 35.8 pg — ABNORMAL HIGH (ref 27.0–33.0)
MCHC: 33.2 g/dL (ref 32.0–36.0)
MCV: 107.9 fL — ABNORMAL HIGH (ref 80.0–100.0)
MONO ABS: 450 {cells}/uL (ref 200–950)
MPV: 8.9 fL (ref 7.5–12.5)
Monocytes Relative: 9 %
NEUTROS ABS: 2450 {cells}/uL (ref 1500–7800)
Neutrophils Relative %: 49 %
Platelets: 238 10*3/uL (ref 140–400)
RBC: 3.66 MIL/uL — AB (ref 3.80–5.10)
RDW: 12.5 % (ref 11.0–15.0)
WBC: 5 10*3/uL (ref 3.8–10.8)

## 2017-01-21 LAB — HEPATIC FUNCTION PANEL
ALBUMIN: 4 g/dL (ref 3.6–5.1)
ALK PHOS: 48 U/L (ref 33–130)
ALT: 23 U/L (ref 6–29)
AST: 28 U/L (ref 10–35)
Bilirubin, Direct: 0.1 mg/dL (ref <=0.2)
Indirect Bilirubin: 0.4 mg/dL (ref 0.2–1.2)
TOTAL PROTEIN: 6.5 g/dL (ref 6.1–8.1)
Total Bilirubin: 0.5 mg/dL (ref 0.2–1.2)

## 2017-01-21 LAB — BASIC METABOLIC PANEL WITH GFR
BUN: 18 mg/dL (ref 7–25)
CHLORIDE: 104 mmol/L (ref 98–110)
CO2: 26 mmol/L (ref 20–31)
Calcium: 9.2 mg/dL (ref 8.6–10.4)
Creat: 0.75 mg/dL (ref 0.50–0.99)
GFR, EST NON AFRICAN AMERICAN: 83 mL/min (ref >=60)
GFR, Est African American: >89 mL/min (ref >=60)
GLUCOSE: 80 mg/dL (ref 65–99)
POTASSIUM: 3.7 mmol/L (ref 3.5–5.3)
Sodium: 137 mmol/L (ref 135–146)

## 2017-01-21 LAB — LIPID PANEL
Cholesterol: 229 mg/dL — ABNORMAL HIGH (ref <200)
HDL: 105 mg/dL (ref >50)
LDL CALC: 108 mg/dL — AB (ref <100)
Total CHOL/HDL Ratio: 2.2 Ratio (ref <5.0)
Triglycerides: 78 mg/dL (ref <150)
VLDL: 16 mg/dL (ref <30)

## 2017-01-21 LAB — TSH: TSH: 3.06 m[IU]/L

## 2017-01-21 MED ORDER — LEVOTHYROXINE SODIUM 50 MCG PO TABS: ORAL_TABLET | ORAL | 3 refills | Status: DC

## 2017-01-21 MED ORDER — VALACYCLOVIR HCL 1 G PO TABS: 1000.0000 mg | ORAL_TABLET | Freq: Two times a day (BID) | ORAL | 2 refills | Status: DC

## 2017-01-21 MED ORDER — LORAZEPAM 2 MG PO TABS: ORAL_TABLET | ORAL | 2 refills | Status: DC

## 2017-01-21 MED ORDER — ESCITALOPRAM OXALATE 20 MG PO TABS: 20.0000 mg | ORAL_TABLET | Freq: Every day | ORAL | 3 refills | Status: DC

## 2017-01-21 MED ORDER — CYCLOBENZAPRINE HCL 10 MG PO TABS: 10.0000 mg | ORAL_TABLET | Freq: Three times a day (TID) | ORAL | 3 refills | Status: DC | PRN

## 2017-01-21 NOTE — Patient Instructions (Signed)
Raynaud Phenomenon Raynaud phenomenon is a condition that affects the blood vessels (arteries) that carry blood to your fingers and toes. The arteries that supply blood to your ears or the tip of your nose might also be affected. Raynaud phenomenon causes the arteries to temporarily narrow. As a result, the flow of blood to the affected areas is temporarily decreased. This usually occurs in response to cold temperatures or stress. During an attack, the skin in the affected areas turns white. You may also feel tingling or numbness in those areas. Attacks usually last for only a brief period, and then the blood flow to the area returns to normal. In most cases, Raynaud phenomenon does not cause serious health problems. What are the causes? For many people with this condition, the cause is not known. Raynaud phenomenon is sometimes associated with other diseases, such as scleroderma or lupus. What increases the risk? Raynaud phenomenon can affect anyone, but it develops most often in people who are 20-40 years old. It affects more females than males. What are the signs or symptoms? Symptoms of Raynaud phenomenon may occur when you are exposed to cold temperatures or when you have emotional stress. The symptoms may last for a few minutes or up to several hours. They usually affect your fingers but may also affect your toes, ears, or the tip of your nose. Symptoms may include:  Changes in skin color. The skin in the affected areas will turn pale or white. The skin may then change from white to bluish to red as normal blood flow returns to the area.  Numbness, tingling, or pain in the affected areas.  In severe cases, sores may develop in the affected areas. How is this diagnosed? Your health care provider will do a physical exam and take your medical history. You may be asked to put your hands in cold water to check for a reaction to cold temperature. Blood tests may be done to check for other diseases or  conditions. Your health care provider may also order a test to check the movement of blood through your arteries and veins (vascular ultrasound). How is this treated? Treatment often involves making lifestyle changes and taking steps to control your exposure to cold temperatures. For more severe cases, medicine (calcium channel blockers) may be used to improve blood flow. Surgery is sometimes done to block the nerves that control the affected arteries, but this is rare. Follow these instructions at home:  Avoid exposure to cold by taking these steps: ? If possible, stay indoors during cold weather. ? When you go outside during cold weather, dress in layers and wear mittens, a hat, a scarf, and warm footwear. ? Wear mittens or gloves when handling ice or frozen food. ? Use holders for glasses or cans containing cold drinks. ? Let warm water run for a while before taking a shower or bath. ? Warm up the car before driving in cold weather.  If possible, avoid stressful and emotional situations. Exercise, meditation, and yoga may help you cope with stress. Biofeedback may be useful.  Do not use any tobacco products, including cigarettes, chewing tobacco, or electronic cigarettes. If you need help quitting, ask your health care provider.  Avoid secondhand smoke.  Limit your use of caffeine. Switch to decaffeinated coffee, tea, and soda. Avoid chocolate.  Wear loose fitting socks and comfortable, roomy shoes.  Avoid vibrating tools and machinery.  Take medicines only as directed by your health care provider. Contact a health care provider if:    Your discomfort becomes worse despite lifestyle changes.  You develop sores on your fingers or toes that do not heal.  Your fingers or toes turn black.  You have breaks in the skin on your fingers or toes.  You have a fever.  You have pain or swelling in your joints.  You have a rash.  Your symptoms occur on only one side of your body. This  information is not intended to replace advice given to you by your health care provider. Make sure you discuss any questions you have with your health care provider. Document Released: 09/20/2000 Document Revised: 02/29/2016 Document Reviewed: 03/27/2016 Elsevier Interactive Patient Education  2017 [employer]  

## 2017-01-22 ENCOUNTER — Encounter: Payer: Self-pay | Admitting: Internal Medicine

## 2017-01-22 LAB — URINALYSIS, ROUTINE W REFLEX MICROSCOPIC
BILIRUBIN URINE: NEGATIVE
GLUCOSE, UA: NEGATIVE
Hgb urine dipstick: NEGATIVE
Ketones, ur: NEGATIVE
Leukocytes, UA: NEGATIVE
Nitrite: NEGATIVE
Protein, ur: NEGATIVE
SPECIFIC GRAVITY, URINE: 1.024 (ref 1.001–1.035)
pH: 5.5 (ref 5.0–8.0)

## 2017-01-22 LAB — MICROALBUMIN / CREATININE URINE RATIO
Creatinine, Urine: 120 mg/dL (ref 20–320)
Microalb Creat Ratio: 3 mcg/mg creat (ref <30)
Microalb, Ur: 0.4 mg/dL

## 2017-01-22 LAB — MAGNESIUM: MAGNESIUM: 2.1 mg/dL (ref 1.5–2.5)

## 2017-01-22 LAB — VITAMIN D 25 HYDROXY (VIT D DEFICIENCY, FRACTURES): VIT D 25 HYDROXY: 83 ng/mL (ref 30–100)

## 2017-01-22 NOTE — Progress Notes (Signed)
Pt aware of lab results & voiced understanding of those results.

## 2017-01-28 DIAGNOSIS — H40053 Ocular hypertension, bilateral: Secondary | ICD-10-CM | POA: Diagnosis not present

## 2017-02-25 DIAGNOSIS — Z85820 Personal history of malignant melanoma of skin: Secondary | ICD-10-CM | POA: Diagnosis not present

## 2017-02-25 DIAGNOSIS — Z85828 Personal history of other malignant neoplasm of skin: Secondary | ICD-10-CM | POA: Diagnosis not present

## 2017-02-25 DIAGNOSIS — L821 Other seborrheic keratosis: Secondary | ICD-10-CM | POA: Diagnosis not present

## 2017-02-25 DIAGNOSIS — D2272 Melanocytic nevi of left lower limb, including hip: Secondary | ICD-10-CM | POA: Diagnosis not present

## 2017-02-25 DIAGNOSIS — D225 Melanocytic nevi of trunk: Secondary | ICD-10-CM | POA: Diagnosis not present

## 2017-02-25 DIAGNOSIS — D692 Other nonthrombocytopenic purpura: Secondary | ICD-10-CM | POA: Diagnosis not present

## 2017-02-25 DIAGNOSIS — D2261 Melanocytic nevi of right upper limb, including shoulder: Secondary | ICD-10-CM | POA: Diagnosis not present

## 2017-06-03 DIAGNOSIS — L859 Epidermal thickening, unspecified: Secondary | ICD-10-CM | POA: Diagnosis not present

## 2017-06-03 DIAGNOSIS — Z85820 Personal history of malignant melanoma of skin: Secondary | ICD-10-CM | POA: Diagnosis not present

## 2017-06-03 DIAGNOSIS — D692 Other nonthrombocytopenic purpura: Secondary | ICD-10-CM | POA: Diagnosis not present

## 2017-06-03 DIAGNOSIS — D2272 Melanocytic nevi of left lower limb, including hip: Secondary | ICD-10-CM | POA: Diagnosis not present

## 2017-06-03 DIAGNOSIS — D225 Melanocytic nevi of trunk: Secondary | ICD-10-CM | POA: Diagnosis not present

## 2017-06-03 DIAGNOSIS — D485 Neoplasm of uncertain behavior of skin: Secondary | ICD-10-CM | POA: Diagnosis not present

## 2017-06-03 DIAGNOSIS — D2261 Melanocytic nevi of right upper limb, including shoulder: Secondary | ICD-10-CM | POA: Diagnosis not present

## 2017-06-03 DIAGNOSIS — Z85828 Personal history of other malignant neoplasm of skin: Secondary | ICD-10-CM | POA: Diagnosis not present

## 2017-06-03 DIAGNOSIS — L821 Other seborrheic keratosis: Secondary | ICD-10-CM | POA: Diagnosis not present

## 2017-06-03 DIAGNOSIS — L720 Epidermal cyst: Secondary | ICD-10-CM | POA: Diagnosis not present

## 2017-06-10 DIAGNOSIS — H524 Presbyopia: Secondary | ICD-10-CM | POA: Diagnosis not present

## 2017-07-25 ENCOUNTER — Other Ambulatory Visit: Payer: Self-pay | Admitting: Physician Assistant

## 2017-07-25 ENCOUNTER — Telehealth: Payer: Self-pay | Admitting: Physician Assistant

## 2017-07-25 ENCOUNTER — Other Ambulatory Visit: Payer: Self-pay | Admitting: Internal Medicine

## 2017-07-25 DIAGNOSIS — F325 Major depressive disorder, single episode, in full remission: Secondary | ICD-10-CM

## 2017-07-25 DIAGNOSIS — G4700 Insomnia, unspecified: Secondary | ICD-10-CM

## 2017-07-25 MED ORDER — LORAZEPAM 2 MG PO TABS: ORAL_TABLET | ORAL | 0 refills | Status: DC

## 2017-07-25 NOTE — Telephone Encounter (Signed)
Will send in med

## 2017-07-25 NOTE — Telephone Encounter (Signed)
Please refill Lorazepam

## 2017-07-25 NOTE — Telephone Encounter (Signed)
Lorazepam has been called into pharmacy on 19th Oct 2018 by DD

## 2017-07-28 NOTE — Telephone Encounter (Signed)
Ativan has been called into pharmacy on 22nd Oct 2018 by DD

## 2017-07-28 NOTE — Progress Notes (Signed)
Medicare wellness and follow up  Assessment:  Depression, major, in remission (Breanna Sparks) lexapro continue it  Anxiety Continue lexapro   Arthritis PRN  Bilateral low back pain without sciatica PRN   Insomnia Continue ativan  Glaucoma Continue follow up  History of squamous cell carcinoma Continue derm follow up q 3 months  HSV-1 (herpes simplex virus 1) infection Valtrex PRN, refill today  Gastroesophageal reflux disease without esophagitis Continue H2   Hypothyroidism, unspecified hypothyroidism type Hypothyroidism-check TSH level, continue medications the same, reminded to take on an empty stomach 30-33mins before food.  - TSH  Vitamin D deficiency - VITAMIN D 25 Hydroxy (Vit-D Deficiency, Fractures)   Medication management - CBC with Differential/Platelet - BASIC METABOLIC PANEL WITH GFR - Hepatic function panel - Magnesium  Screening cholesterol level - Lipid panel   Routine general medical examination at a health care facility   Osteopenia Continue vitamin d, weight bearing exercises, due 2019   Screening for blood or protein in urine - Urinalysis, Routine w reflex microscopic (not at [facility]) - Microalbumin / creatinine urine ratio   Discussed med's effects and SE's. Screening labs and tests as requested with regular follow-up as recommended. Future Appointments Date Time Provider [place]  01/21/2018 2:00 PM Vicie Mutters, PA-C [facility] None   Plan:   During the course of the visit the patient was educated and counseled about appropriate screening and preventive services including:    Pneumococcal vaccine   Influenza vaccine  Td vaccine  Prevnar 13  Screening electrocardiogram  Screening mammography  Bone densitometry screening  Colorectal cancer screening  Diabetes screening  Glaucoma screening  Nutrition counseling   Advanced directives: given info/requested copies   Subjective:    HPI 66 y.o. female  presents  for a medicare wellness and follow up for chol, depression, SCC, vitamin D def.  Her blood pressure has been controlled at home, today their BP is BP: 118/80 She denies chest pain, shortness of breath, dizziness.  Her cholesterol is diet controlled.  Her cholesterol is controlled. The cholesterol last visit was:   Lab Results  Component Value Date   CHOL 229 (H) 01/21/2017   HDL 105 01/21/2017   LDLCALC 108 (H) 01/21/2017   TRIG 78 01/21/2017   CHOLHDL 2.2 01/21/2017   Patient is on Vitamin D supplement.  Lab Results  Component Value Date   VD25OH 83 01/21/2017   She has had a normal A1C.  Lab Results  Component Value Date   HGBA1C 5.1 11/16/2013   History of SCC and melanoma last OV, follows with DERM q 3 months.  She has glacoma and follows with her eye doctor every 6 months.  Patient is on lexapro 20 and states she is doing well with it.  Takes ativan at night for sleep.  She is on estrogen/progresterone cream, gets a combo from [place].  She has history of elevated AST/ALT, negative hepatitis 05/2014.  Lab Results  Component Value Date   ALT 23 01/21/2017   AST 28 01/21/2017   ALKPHOS 48 01/21/2017   BILITOT 0.5 01/21/2017   She is on thyroid medication. Her medication was not changed last visit.   Lab Results  Component Value Date   TSH 3.06 01/21/2017  .  BMI is Body mass index is 17.92 kg/m., she is working on diet and exercise. Wt Readings from Last 3 Encounters:  07/29/17 111 lb (50.3 kg)  01/21/17 113 lb 6.4 oz (51.4 kg)  07/25/16 112 lb (50.8 kg)  Current Medications:  Current Outpatient Prescriptions on File Prior to Visit  Medication Sig Dispense Refill  . Cholecalciferol (VITAMIN D PO) Take 1,000 Int'l Units by mouth daily.    . Cyanocobalamin (VITAMIN B-12 PO) Take by mouth daily.    . cyclobenzaprine (FLEXERIL) 10 MG tablet Take 1 tablet (10 mg total) by mouth 3 (three) times daily as needed. for muscle spams 90 tablet 3  . escitalopram  (LEXAPRO) 20 MG tablet Take 1 tablet (20 mg total) by mouth daily. 90 tablet 3  . levothyroxine (SYNTHROID, LEVOTHROID) 50 MCG tablet TAKE ONE TABLET BY MOUTH ONCE DAILY BEFORE BREAKFAST 90 tablet 3  . LORazepam (ATIVAN) 2 MG tablet TAKE 1/2 TO 1 TABLET ONCE DAILY AT BEDTIME AS NEEDED 30 tablet 0  . ranitidine (ZANTAC) 300 MG tablet Take twice a day while trying to get off PPI, then can go to once at night 60 tablet 3  . valACYclovir (VALTREX) 1000 MG tablet Take 1 tablet (1,000 mg total) by mouth 2 (two) times daily. 60 tablet 2   No current facility-administered medications on file prior to visit.    Immunization History  Administered Date(s) Administered  . Influenza Split 07/02/2013, 07/06/2014  . Influenza, High Dose Seasonal PF 06/26/2015, 07/11/2016  . Pneumococcal Conjugate-13 07/25/2015  . Pneumococcal Polysaccharide-23 02/03/2014  . Tdap 11/13/2012  . Zoster 11/16/2013   Health Maintenance:  Tetanus: 2014 Pneumovax: 2015 Prevnar 13: 2016 Flu vaccine: 2017 out of in the office, will come back Zostavax: 2015  Pap: TAH not needed MGM: 01/2017 DEXA: 11/2015, osteopenia due 2019 Colonoscopy: 03/2008- repeat 10 years Dr. Olevia Perches EGD: 03/2008   Dr. Nicki Reaper eye doctor Oct 2018 q 3-6 months for glaucoma Dr. Jacelyn Grip Dentist every 6 months Patient Care Team: Unk Pinto, MD as PCP - General (Internal Medicine) [place] Dragon, MD (Inactive) as Consulting Physician (Gastroenterology) Macarthur Critchley, [place] as Referring Physician (Optometry) [place], Amy, MD as Consulting Physician (Dermatology) Kristeen Miss, MD as Consulting Physician (Neurosurgery)  Medical History: Patient Active Problem List   Diagnosis Date Noted  . Gastroesophageal reflux disease without esophagitis 01/10/2016  . Thyroid activity decreased 01/10/2016  . Osteopenia 01/10/2016  . Encounter for Medicare annual wellness exam 07/25/2015  . Lower back pain 01/09/2015  . Insomnia 01/09/2015  . HSV-1 (herpes simplex  virus 1) infection 01/09/2015  . Depression, major, in remission ([name])   . Anxiety   . Glaucoma   . Arthritis   . History of squamous cell carcinoma    Allergies Allergies  Allergen Reactions  . Codeine     REACTION: Itch/nausea/vomiting  . Meperidine Hcl     REACTION: itch  . Morphine     REACTION: tachycardia    SURGICAL HISTORY She  has a past surgical history that includes Abdominal hysterectomy; Breast surgery; Cervical fusion (2012); Squamous cell carcinoma excision; Anterior cruciate ligament repair (Bilateral, 1998); and Augmentation mammaplasty (Bilateral). FAMILY HISTORY Her family history includes Diabetes in her mother; Glaucoma in her mother; Heart attack in her father; Heart disease in her mother; Hypertension in her mother. SOCIAL HISTORY She  reports that she has never smoked. She has never used smokeless tobacco. She reports that she drinks alcohol. She reports that she does not use drugs.  MEDICARE WELLNESS OBJECTIVES: Physical activity: Current Exercise Habits: Structured exercise class, Type of exercise: strength training/weights;Other - see comments (gym), Time (Minutes): 40, Frequency (Times/Week): 3, Weekly Exercise (Minutes/Week): 120, Intensity: Mild Cardiac risk factors: Cardiac Risk Factors include: advanced age (>61men, >34 women);hypertension  Depression/mood screen:   Depression screen [facility] 2/9 07/29/2017  Decreased Interest 0  Down, Depressed, Hopeless 0  PHQ - 2 Score 0    ADLs:  In your present state of health, do you have any difficulty performing the following activities: 07/29/2017  Hearing? N  Vision? N  Difficulty concentrating or making decisions? N  Walking or climbing stairs? N  Dressing or bathing? N  Doing errands, shopping? N  Some recent data might be hidden     Cognitive Testing  Alert? Yes  Normal Appearance?Yes  Oriented to person? Yes  Place? Yes   Time? Yes  Recall of three objects?  Yes  Can perform simple  calculations? Yes  Displays appropriate judgment?Yes  Can read the correct time from a watch face?Yes  EOL planning: Does Patient Have a Medical Advance Directive?: Yes Type of Advance Directive: Healthcare Power of Attorney, Living will Copy of [place] in Chart?: No - copy requested    Review of Systems  Constitutional: Negative.   HENT: Negative.   Eyes: Negative.   Respiratory: Negative.   Cardiovascular: Negative.   Gastrointestinal: Negative.   Genitourinary: Negative.   Musculoskeletal: Negative.   Skin: Negative.   Neurological: Negative.   Endo/Heme/Allergies: Negative.   Psychiatric/Behavioral: Negative.     Physical Exam: Estimated body mass index is 17.92 kg/m as calculated from the following:   Height as of this encounter: 5\' 6"  (1.676 m).   Weight as of this encounter: 111 lb (50.3 kg). Vitals:   07/29/17 1104  BP: 118/80  Pulse: 76  Resp: 14  Temp: (!) 97.5 F (36.4 C)  SpO2: 99%   General Appearance: Well nourished, in no apparent distress. Eyes: PERRLA, EOMs, conjunctiva no swelling or erythema, normal fundi and vessels. Sinuses: No Frontal/maxillary tenderness ENT/Mouth: Ext aud canals clear, normal light reflex with TMs without erythema, bulging.  Good dentition. No erythema, swelling, or exudate on post pharynx. Tonsils not swollen or erythematous. Hearing normal.  Neck: Supple, thyroid normal. No bruits Respiratory: Respiratory effort normal, BS equal bilaterally without rales, rhonchi, wheezing or stridor. Cardio: RRR without murmurs, rubs or gallops. Brisk peripheral pulses without edema.  Chest: symmetric, with normal excursions and percussion. Breasts: Symmetric, without lumps, nipple discharge, retractions. + bilateral implants Abdomen: Soft, +BS. Non tender, no guarding, rebound, hernias, masses, or organomegaly. .  Lymphatics: Non tender without lymphadenopathy.  Genitourinary: defer Musculoskeletal: Full ROM all  peripheral extremities,5/5 strength, and normal gait.  Skin: Warm, dry without rashes, lesions, ecchymosis.  Neuro: Cranial nerves intact, reflexes equal bilaterally. Normal muscle tone, no cerebellar symptoms. Sensation intact.  Psych: Awake and oriented X 3, normal affect, Insight and Judgment appropriate.   Medicare Attestation I have personally reviewed: The patient's medical and social history Their use of alcohol, tobacco or illicit drugs Their current medications and supplements The patient's functional ability including ADLs,fall risks, home safety risks, cognitive, and hearing and visual impairment Diet and physical activities Evidence for depression or mood disorders  The patient's weight, height, BMI, and visual acuity have been recorded in the chart.  I have made referrals, counseling, and provided education to the patient based on review of the above and I have provided the patient with a written personalized care plan for preventive services.   Vicie Mutters 11:22 AM

## 2017-07-29 ENCOUNTER — Encounter: Payer: Self-pay | Admitting: Physician Assistant

## 2017-07-29 ENCOUNTER — Ambulatory Visit (INDEPENDENT_AMBULATORY_CARE_PROVIDER_SITE_OTHER): Payer: PPO | Admitting: Physician Assistant

## 2017-07-29 VITALS — BP 118/80 | HR 76 | Temp 97.5°F | Resp 14 | Ht 66.0 in | Wt 111.0 lb

## 2017-07-29 DIAGNOSIS — M545 Low back pain, unspecified: Secondary | ICD-10-CM

## 2017-07-29 DIAGNOSIS — Z8589 Personal history of malignant neoplasm of other organs and systems: Secondary | ICD-10-CM

## 2017-07-29 DIAGNOSIS — B009 Herpesviral infection, unspecified: Secondary | ICD-10-CM | POA: Diagnosis not present

## 2017-07-29 DIAGNOSIS — M8589 Other specified disorders of bone density and structure, multiple sites: Secondary | ICD-10-CM

## 2017-07-29 DIAGNOSIS — E782 Mixed hyperlipidemia: Secondary | ICD-10-CM | POA: Diagnosis not present

## 2017-07-29 DIAGNOSIS — D692 Other nonthrombocytopenic purpura: Secondary | ICD-10-CM | POA: Diagnosis not present

## 2017-07-29 DIAGNOSIS — M1990 Unspecified osteoarthritis, unspecified site: Secondary | ICD-10-CM

## 2017-07-29 DIAGNOSIS — Z79899 Other long term (current) drug therapy: Secondary | ICD-10-CM | POA: Diagnosis not present

## 2017-07-29 DIAGNOSIS — F325 Major depressive disorder, single episode, in full remission: Secondary | ICD-10-CM

## 2017-07-29 DIAGNOSIS — R6889 Other general symptoms and signs: Secondary | ICD-10-CM

## 2017-07-29 DIAGNOSIS — Z0000 Encounter for general adult medical examination without abnormal findings: Secondary | ICD-10-CM

## 2017-07-29 DIAGNOSIS — E039 Hypothyroidism, unspecified: Secondary | ICD-10-CM

## 2017-07-29 DIAGNOSIS — Z0001 Encounter for general adult medical examination with abnormal findings: Secondary | ICD-10-CM

## 2017-07-29 DIAGNOSIS — K219 Gastro-esophageal reflux disease without esophagitis: Secondary | ICD-10-CM

## 2017-07-29 DIAGNOSIS — H409 Unspecified glaucoma: Secondary | ICD-10-CM | POA: Diagnosis not present

## 2017-07-29 DIAGNOSIS — G4700 Insomnia, unspecified: Secondary | ICD-10-CM

## 2017-07-29 LAB — BASIC METABOLIC PANEL WITH GFR
BUN: 15 mg/dL (ref 7–25)
CALCIUM: 9.3 mg/dL (ref 8.6–10.4)
CHLORIDE: 102 mmol/L (ref 98–110)
CO2: 28 mmol/L (ref 20–32)
Creat: 0.71 mg/dL (ref 0.50–0.99)
GFR, Est African American: 102 mL/min/{1.73_m2} (ref >=60)
GFR, Est Non African American: 88 mL/min/{1.73_m2} (ref >=60)
GLUCOSE: 93 mg/dL (ref 65–99)
POTASSIUM: 4.4 mmol/L (ref 3.5–5.3)
Sodium: 138 mmol/L (ref 135–146)

## 2017-07-29 LAB — CBC WITH DIFFERENTIAL/PLATELET
BASOS PCT: 0.4 %
Basophils Absolute: 20 cells/uL (ref 0–200)
EOS PCT: 4.7 %
Eosinophils Absolute: 230 cells/uL (ref 15–500)
HCT: 38.8 % (ref 35.0–45.0)
Hemoglobin: 13.4 g/dL (ref 11.7–15.5)
Lymphs Abs: 2004 cells/uL (ref 850–3900)
MCH: 37 pg — ABNORMAL HIGH (ref 27.0–33.0)
MCHC: 34.5 g/dL (ref 32.0–36.0)
MCV: 107.2 fL — ABNORMAL HIGH (ref 80.0–100.0)
MPV: 9.3 fL (ref 7.5–12.5)
Monocytes Relative: 8.3 %
NEUTROS PCT: 45.7 %
Neutro Abs: 2239 cells/uL (ref 1500–7800)
PLATELETS: 245 10*3/uL (ref 140–400)
RBC: 3.62 10*6/uL — AB (ref 3.80–5.10)
RDW: 11.4 % (ref 11.0–15.0)
TOTAL LYMPHOCYTE: 40.9 %
WBC mixed population: 407 cells/uL (ref 200–950)
WBC: 4.9 10*3/uL (ref 3.8–10.8)

## 2017-07-29 LAB — HEPATIC FUNCTION PANEL
AG Ratio: 2.1 (calc) (ref 1.0–2.5)
ALBUMIN MSPROF: 4.4 g/dL (ref 3.6–5.1)
ALT: 25 U/L (ref 6–29)
AST: 31 U/L (ref 10–35)
Alkaline phosphatase (APISO): 48 U/L (ref 33–130)
Bilirubin, Direct: 0.1 mg/dL (ref 0.0–0.2)
GLOBULIN: 2.1 g/dL (ref 1.9–3.7)
Indirect Bilirubin: 0.4 mg/dL (calc) (ref 0.2–1.2)
TOTAL PROTEIN: 6.5 g/dL (ref 6.1–8.1)
Total Bilirubin: 0.5 mg/dL (ref 0.2–1.2)

## 2017-07-29 LAB — LIPID PANEL
Cholesterol: 227 mg/dL — ABNORMAL HIGH (ref <200)
HDL: 109 mg/dL (ref >50)
LDL Cholesterol (Calc): 100 mg/dL (calc) — ABNORMAL HIGH
NON-HDL CHOLESTEROL (CALC): 118 mg/dL (ref <130)
Total CHOL/HDL Ratio: 2.1 (calc) (ref <5.0)
Triglycerides: 85 mg/dL (ref <150)

## 2017-07-29 LAB — TSH: TSH: 1.1 m[IU]/L (ref 0.40–4.50)

## 2017-07-29 LAB — MAGNESIUM: MAGNESIUM: 2.1 mg/dL (ref 1.5–2.5)

## 2017-07-29 MED ORDER — LORAZEPAM 2 MG PO TABS: ORAL_TABLET | ORAL | 0 refills | Status: DC

## 2017-07-30 NOTE — Progress Notes (Signed)
Pt aware of lab results & voiced understanding of those results.

## 2017-07-30 NOTE — Progress Notes (Signed)
LVM for pt to return office call for LAB results.

## 2017-07-31 ENCOUNTER — Ambulatory Visit (INDEPENDENT_AMBULATORY_CARE_PROVIDER_SITE_OTHER): Payer: PPO

## 2017-07-31 DIAGNOSIS — Z23 Encounter for immunization: Secondary | ICD-10-CM

## 2017-08-08 ENCOUNTER — Encounter: Payer: Self-pay | Admitting: Physician Assistant

## 2017-08-08 ENCOUNTER — Ambulatory Visit (INDEPENDENT_AMBULATORY_CARE_PROVIDER_SITE_OTHER): Payer: PPO | Admitting: Physician Assistant

## 2017-08-08 ENCOUNTER — Ambulatory Visit (HOSPITAL_COMMUNITY)
Admission: RE | Admit: 2017-08-08 | Discharge: 2017-08-08 | Disposition: A | Discharge status: Home / Self Care | Payer: PPO | Source: Ambulatory Visit | Attending: Physician Assistant | Admitting: Physician Assistant

## 2017-08-08 VITALS — BP 114/78 | HR 74 | Temp 97.3°F | Resp 14 | Ht 66.0 in | Wt 110.6 lb

## 2017-08-08 DIAGNOSIS — R0781 Pleurodynia: Secondary | ICD-10-CM | POA: Insufficient documentation

## 2017-08-08 DIAGNOSIS — S299XXA Unspecified injury of thorax, initial encounter: Secondary | ICD-10-CM | POA: Diagnosis not present

## 2017-08-08 DIAGNOSIS — R0602 Shortness of breath: Secondary | ICD-10-CM

## 2017-08-08 DIAGNOSIS — J449 Chronic obstructive pulmonary disease, unspecified: Secondary | ICD-10-CM | POA: Insufficient documentation

## 2017-08-08 MED ORDER — HYDROCODONE-ACETAMINOPHEN 5-325 MG PO TABS: 1.0000 | ORAL_TABLET | Freq: Four times a day (QID) | ORAL | 0 refills | Status: DC | PRN

## 2017-08-08 NOTE — Patient Instructions (Signed)
Rib Contusion A rib contusion is a deep bruise on your rib area. Contusions are the result of a blunt trauma that causes bleeding and injury to the tissues under the skin. A rib contusion may involve bruising of the ribs and of the skin and muscles in the area. The skin overlying the contusion may turn blue, purple, or yellow. Minor injuries will give you a painless contusion, but more severe contusions may stay painful and swollen for a few weeks. What are the causes? A contusion is usually caused by a blow, trauma, or direct force to an area of the body. This often occurs while playing contact sports. What are the signs or symptoms?  Swelling and redness of the injured area.  Discoloration of the injured area.  Tenderness and soreness of the injured area.  Pain with or without movement. How is this diagnosed? The diagnosis can be made by taking a medical history and performing a physical exam. An X-ray, CT scan, or MRI may be needed to determine if there were any associated injuries, such as broken bones (fractures) or internal injuries. How is this treated? Often, the best treatment for a rib contusion is rest. Icing or applying cold compresses to the injured area may help reduce swelling and inflammation. Deep breathing exercises may be recommended to reduce the risk of partial lung collapse and pneumonia. Over-the-counter or prescription medicines may also be recommended for pain control. Follow these instructions at home:  Apply ice to the injured area: ? Put ice in a plastic bag. ? Place a towel between your skin and the bag. ? Leave the ice on for 20 minutes, 2-3 times per day.  Take medicines only as directed by your health care provider.  Rest the injured area. Avoid strenuous activity and any activities or movements that cause pain. Be careful during activities and avoid bumping the injured area.  Perform deep-breathing exercises as directed by your health care provider.  Do  not lift anything that is heavier than 5 lb (2.3 kg) until your health care provider approves.  Do not use any tobacco products, including cigarettes, chewing tobacco, or electronic cigarettes. If you need help quitting, ask your health care provider. Contact a health care provider if:  You have increased bruising or swelling.  You have pain that is not controlled with treatment.  You have a fever. Get help right away if:  You have difficulty breathing or shortness of breath.  You develop a continual cough, or you cough up thick or bloody sputum.  You feel sick to your stomach (nauseous), you throw up (vomit), or you have abdominal pain. This information is not intended to replace advice given to you by your health care provider. Make sure you discuss any questions you have with your health care provider. Document Released: 06/18/2001 Document Revised: 02/29/2016 Document Reviewed: 07/05/2014 Elsevier Interactive Patient Education  2018 [employer]vier Inc.  

## 2017-08-08 NOTE — Progress Notes (Signed)
Subjective:    Patient ID: Breanna Sparks, female    DOB: 1951-04-05, 66 y.o.   MRN: [id number]  HPI 66 y.o. WF presents with possible broken ribs s/p fall 1 week ago.  Tripped due to it being dark, fell onto right breast/rib onto corner of counter. SOB due to not being able to take deep breath in, and worse with cough. Some fever, chills. Taking ibuprofen and heat that has helped some.   Blood pressure 114/78, pulse 74, temperature (!) 97.3 F (36.3 C), resp. rate 14, height 5\' 6"  (1.676 m), weight 110 lb 9.6 oz (50.2 kg), SpO2 95 %.  Medications Current Outpatient Prescriptions on File Prior to Visit  Medication Sig  . Cholecalciferol (VITAMIN D PO) Take 1,000 Int'l Units by mouth daily.  . Cyanocobalamin (VITAMIN B-12 PO) Take by mouth daily.  . cyclobenzaprine (FLEXERIL) 10 MG tablet Take 1 tablet (10 mg total) by mouth 3 (three) times daily as needed. for muscle spams  . escitalopram (LEXAPRO) 20 MG tablet Take 1 tablet (20 mg total) by mouth daily.  Marland Kitchen levothyroxine (SYNTHROID, LEVOTHROID) 50 MCG tablet TAKE ONE TABLET BY MOUTH ONCE DAILY BEFORE BREAKFAST  . LORazepam (ATIVAN) 2 MG tablet TAKE 1/2 TO 1 TABLET ONCE DAILY AT BEDTIME AS NEEDED  . ranitidine (ZANTAC) 300 MG tablet Take twice a day while trying to get off PPI, then can go to once at night  . valACYclovir (VALTREX) 1000 MG tablet Take 1 tablet (1,000 mg total) by mouth 2 (two) times daily.   No current facility-administered medications on file prior to visit.     Problem list She has Depression, major, in remission ([place]); Anxiety; Glaucoma; Arthritis; History of squamous cell carcinoma; Lower back pain; Insomnia; HSV-1 (herpes simplex virus 1) infection; Encounter for Medicare annual wellness exam; Gastroesophageal reflux disease without esophagitis; Thyroid activity decreased; and Osteopenia on her problem list.   Review of Systems  Constitutional: Negative.   HENT: Negative.   Respiratory: Positive for  shortness of breath. Negative for apnea, cough, choking, chest tightness, wheezing and stridor.   Cardiovascular: Positive for chest pain. Negative for palpitations and leg swelling.  Gastrointestinal: Negative.   Genitourinary: Negative.   Musculoskeletal: Negative.   Skin: Positive for color change. Negative for pallor, rash and wound.  Neurological: Negative.   Hematological: Negative.   Psychiatric/Behavioral: Negative.        Objective:   Physical Exam  Constitutional: She appears well-developed and well-nourished. No distress.  Neck: Normal range of motion. Neck supple. No tracheal deviation present.  Cardiovascular: Normal rate and regular rhythm.   No murmur heard. Pulmonary/Chest: Effort normal and breath sounds normal. No stridor. She exhibits tenderness (rib tenderness right  anterior  at 10).  Abdominal: Soft. Bowel sounds are normal. There is no tenderness.      Assessment & Plan:   Rib pain on right side with SOB -     DG Ribs Unilateral Right; Future -     DG Chest 2 View; Future -     HYDROcodone-acetaminophen (NORCO) 5-325 MG tablet; Take 1 tablet by mouth every 6 (six) hours as needed for moderate pain. - encouraged to get spirometer, take deep breaths, can hug pillow, given limited pain medication, counseled to not take with valium  The patient was advised to call immediately if she has any concerning symptoms in the interval. The patient voices understanding of current treatment options and is in agreement with the current care plan.The patient knows to call  the clinic with any problems, questions or concerns or go to the ER if any further progression of symptoms.

## 2017-09-02 DIAGNOSIS — D225 Melanocytic nevi of trunk: Secondary | ICD-10-CM | POA: Diagnosis not present

## 2017-09-02 DIAGNOSIS — L245 Irritant contact dermatitis due to other chemical products: Secondary | ICD-10-CM | POA: Diagnosis not present

## 2017-09-02 DIAGNOSIS — Z85820 Personal history of malignant melanoma of skin: Secondary | ICD-10-CM | POA: Diagnosis not present

## 2017-09-02 DIAGNOSIS — Z85828 Personal history of other malignant neoplasm of skin: Secondary | ICD-10-CM | POA: Diagnosis not present

## 2017-09-02 DIAGNOSIS — D2261 Melanocytic nevi of right upper limb, including shoulder: Secondary | ICD-10-CM | POA: Diagnosis not present

## 2017-09-02 DIAGNOSIS — L570 Actinic keratosis: Secondary | ICD-10-CM | POA: Diagnosis not present

## 2017-09-02 DIAGNOSIS — Z419 Encounter for procedure for purposes other than remedying health state, unspecified: Secondary | ICD-10-CM | POA: Diagnosis not present

## 2017-09-02 DIAGNOSIS — L821 Other seborrheic keratosis: Secondary | ICD-10-CM | POA: Diagnosis not present

## 2017-09-02 DIAGNOSIS — D2272 Melanocytic nevi of left lower limb, including hip: Secondary | ICD-10-CM | POA: Diagnosis not present

## 2017-09-08 ENCOUNTER — Other Ambulatory Visit: Payer: Self-pay

## 2017-09-08 DIAGNOSIS — E039 Hypothyroidism, unspecified: Secondary | ICD-10-CM

## 2017-09-08 MED ORDER — LEVOTHYROXINE SODIUM 50 MCG PO TABS: ORAL_TABLET | ORAL | 3 refills | Status: DC

## 2017-09-09 DIAGNOSIS — H401131 Primary open-angle glaucoma, bilateral, mild stage: Secondary | ICD-10-CM | POA: Diagnosis not present

## 2017-09-23 DIAGNOSIS — Z85828 Personal history of other malignant neoplasm of skin: Secondary | ICD-10-CM | POA: Diagnosis not present

## 2017-09-23 DIAGNOSIS — L282 Other prurigo: Secondary | ICD-10-CM | POA: Diagnosis not present

## 2017-09-23 DIAGNOSIS — L245 Irritant contact dermatitis due to other chemical products: Secondary | ICD-10-CM | POA: Diagnosis not present

## 2017-09-23 DIAGNOSIS — Z85820 Personal history of malignant melanoma of skin: Secondary | ICD-10-CM | POA: Diagnosis not present

## 2017-10-05 ENCOUNTER — Other Ambulatory Visit: Payer: Self-pay | Admitting: Physician Assistant

## 2017-10-05 DIAGNOSIS — M1990 Unspecified osteoarthritis, unspecified site: Secondary | ICD-10-CM

## 2017-10-22 ENCOUNTER — Other Ambulatory Visit: Payer: Self-pay | Admitting: Physician Assistant

## 2017-10-22 DIAGNOSIS — G4700 Insomnia, unspecified: Secondary | ICD-10-CM

## 2017-10-22 DIAGNOSIS — F325 Major depressive disorder, single episode, in full remission: Secondary | ICD-10-CM

## 2017-10-22 MED ORDER — LORAZEPAM 2 MG PO TABS: ORAL_TABLET | ORAL | 0 refills | Status: DC

## 2017-10-22 NOTE — Progress Notes (Signed)
Future Appointments  Date Time Provider [place]  01/21/2018  2:00 PM Vicie Mutters, PA-C [facility] None

## 2017-11-25 DIAGNOSIS — Z419 Encounter for procedure for purposes other than remedying health state, unspecified: Secondary | ICD-10-CM | POA: Diagnosis not present

## 2017-11-25 DIAGNOSIS — D2272 Melanocytic nevi of left lower limb, including hip: Secondary | ICD-10-CM | POA: Diagnosis not present

## 2017-11-25 DIAGNOSIS — Z85820 Personal history of malignant melanoma of skin: Secondary | ICD-10-CM | POA: Diagnosis not present

## 2017-11-25 DIAGNOSIS — D2261 Melanocytic nevi of right upper limb, including shoulder: Secondary | ICD-10-CM | POA: Diagnosis not present

## 2017-11-25 DIAGNOSIS — Z85828 Personal history of other malignant neoplasm of skin: Secondary | ICD-10-CM | POA: Diagnosis not present

## 2017-11-25 DIAGNOSIS — D2271 Melanocytic nevi of right lower limb, including hip: Secondary | ICD-10-CM | POA: Diagnosis not present

## 2017-11-25 DIAGNOSIS — L570 Actinic keratosis: Secondary | ICD-10-CM | POA: Diagnosis not present

## 2017-11-25 DIAGNOSIS — L245 Irritant contact dermatitis due to other chemical products: Secondary | ICD-10-CM | POA: Diagnosis not present

## 2017-11-25 DIAGNOSIS — L821 Other seborrheic keratosis: Secondary | ICD-10-CM | POA: Diagnosis not present

## 2017-11-25 DIAGNOSIS — C44519 Basal cell carcinoma of skin of other part of trunk: Secondary | ICD-10-CM | POA: Diagnosis not present

## 2017-11-25 DIAGNOSIS — D225 Melanocytic nevi of trunk: Secondary | ICD-10-CM | POA: Diagnosis not present

## 2017-12-16 ENCOUNTER — Other Ambulatory Visit: Payer: Self-pay | Admitting: Internal Medicine

## 2017-12-16 DIAGNOSIS — Z139 Encounter for screening, unspecified: Secondary | ICD-10-CM

## 2018-01-13 ENCOUNTER — Ambulatory Visit
Admission: RE | Admit: 2018-01-13 | Discharge: 2018-01-13 | Disposition: A | Discharge status: Home / Self Care | Payer: PPO | Source: Ambulatory Visit | Attending: Internal Medicine | Admitting: Internal Medicine

## 2018-01-13 DIAGNOSIS — Z1231 Encounter for screening mammogram for malignant neoplasm of breast: Secondary | ICD-10-CM | POA: Diagnosis not present

## 2018-01-13 DIAGNOSIS — Z139 Encounter for screening, unspecified: Secondary | ICD-10-CM

## 2018-01-19 NOTE — Progress Notes (Signed)
Medicare wellness and follow up  Assessment:  Depression, major, in remission ([place]) lexapro continue it  Anxiety Continue lexapro   Arthritis PRN  Bilateral low back pain without sciatica PRN   Insomnia Continue ativan  Glaucoma Continue follow up  History of squamous cell carcinoma Continue derm follow up q 3 months  HSV-1 (herpes simplex virus 1) infection Valtrex PRN, refill today  Gastroesophageal reflux disease without esophagitis Continue H2   Hypothyroidism, unspecified hypothyroidism type Hypothyroidism-check TSH level, continue medications the same, reminded to take on an empty stomach 30-72mns before food.  - TSH  Vitamin D deficiency - VITAMIN D 25 Hydroxy (Vit-D Deficiency, Fractures)   Medication management - CBC with Differential/Platelet - BASIC METABOLIC PANEL WITH GFR - Hepatic function panel - Magnesium  Screening cholesterol level - Lipid panel   Routine general medical examination at a health care facility   Osteopenia Continue vitamin d, weight bearing exercises, due 2019   Screening for blood or protein in urine - Urinalysis, [facility] - Microalbumin / creatinine urine ratio  Back pain Will refer to PT for dry needling/evaluation  Discussed med's effects and SE's. Screening labs and tests as requested with regular follow-up as recommended. No future appointments. Plan:   During the course of the visit the patient was educated and counseled about appropriate screening and preventive services including:    Pneumococcal vaccine   Influenza vaccine  Td vaccine  Prevnar 13  Screening electrocardiogram  Screening mammography  Bone densitometry screening  Colorectal cancer screening  Diabetes screening  Glaucoma screening  Nutrition counseling   Advanced directives: given info/requested copies   Subjective:    HPI 67y.o. female  presents for a medicare wellness and follow up  for chol, depression, SCC, vitamin D def.   She has throracic left side back pain, has been years, no injury, history of scoliosis, will take flexeril PRN, she is out and needs more, will help but she would like to try dry needling/deep tissue.   She has had allergies, on benadaryl every 4 hours.   She had a fall 1.5 months ago, did not see anyone. She states she took alk plus night time and her ativan to sleep, was getting up to go to the bathroom, she was confused, could not find anything. Hit a dresser due to imbalance, fell backwards and hit her head on the dresser. She has had some vertigo/nausea that has resolved.  She has not had any other symptoms since that time.   Her blood pressure has been controlled at home, today their BP is BP: 112/76 She denies chest pain, shortness of breath, dizziness.  Her cholesterol is diet controlled.  Her cholesterol is controlled. The cholesterol last visit was:   Lab Results  Component Value Date   CHOL 227 (H) 07/29/2017   HDL 109 07/29/2017   LDLCALC 100 (H) 07/29/2017   TRIG 85 07/29/2017   CHOLHDL 2.1 07/29/2017   Patient is on Vitamin D supplement.  Lab Results  Component Value Date   VD25OH 83 01/21/2017   She has had a normal A1C.  Lab Results  Component Value Date   HGBA1C 5.1 11/16/2013   History of SCC and melanoma last OV, follows with DERM q 3 months.  She has glacoma and follows with her eye doctor every 6 months.  Patient is on lexapro 20 and states she is doing well with it.  Takes ativan at night for sleep.  She is  on estrogen/progresterone cream, gets a combo from [place].  She has history of elevated AST/ALT, negative hepatitis 05/2014.  Lab Results  Component Value Date   ALT 25 07/29/2017   AST 31 07/29/2017   ALKPHOS 48 01/21/2017   BILITOT 0.5 07/29/2017   She is on thyroid medication. Her medication was not changed last visit.   Lab Results  Component Value Date   TSH 1.10 07/29/2017  .  BMI is Body mass  index is 17.76 kg/m., she is working on diet and exercise. She is eating more veggies, has started training. She is working on her balance as well and doing well.  Wt Readings from Last 3 Encounters:  01/21/18 108 lb 6.4 oz (49.2 kg)  08/08/17 110 lb 9.6 oz (50.2 kg)  07/29/17 111 lb (50.3 kg)     Current Medications:  Current Outpatient Medications on File Prior to Visit  Medication Sig Dispense Refill  . Cholecalciferol (VITAMIN D PO) Take 1,000 Int'l Units by mouth daily.    . Cyanocobalamin (VITAMIN B-12 PO) Take by mouth daily.    Marland Kitchen LORazepam (ATIVAN) 2 MG tablet TAKE 1/2 TO 1 TABLET ONCE DAILY AT BEDTIME AS NEEDED 90 tablet 0   No current facility-administered medications on file prior to visit.    Immunization History  Administered Date(s) Administered  . Influenza Split 07/02/2013, 07/06/2014  . Influenza, High Dose Seasonal PF 06/26/2015, 07/11/2016, 07/31/2017  . Pneumococcal Conjugate-13 07/25/2015  . Pneumococcal Polysaccharide-23 02/03/2014  . Tdap 11/13/2012  . Zoster 11/16/2013   Health Maintenance:  Tetanus: 2014 Pneumovax: 2015 Prevnar 13: 2016 Flu vaccine: 2018 Zostavax: 2015  Pap: TAH not needed MGM: 01/2018 DEXA: 11/2015, osteopenia due 2019 Colonoscopy: 03/2008- repeat 10 years Dr. Olevia Perches EGD: 03/2008   Dr. Nicki Reaper eye doctor Oct 2018 q 3-6 months for glaucoma Dr. Jacelyn Grip Dentist every 6 months Patient Care Team: Unk Pinto, MD as PCP - General (Internal Medicine) Lafayette Dragon, MD (Inactive) as Consulting Physician (Gastroenterology) Macarthur Critchley, [place] as Referring Physician (Optometry) Martinique, Amy, MD as Consulting Physician (Dermatology) Kristeen Miss, MD as Consulting Physician (Neurosurgery)  Medical History: Patient Active Problem List   Diagnosis Date Noted  . COPD (chronic obstructive pulmonary disease) ([place]) 08/08/2017  . Gastroesophageal reflux disease without esophagitis 01/10/2016  . Thyroid activity decreased 01/10/2016  .  Osteopenia 01/10/2016  . Encounter for Medicare annual wellness exam 07/25/2015  . Lower back pain 01/09/2015  . Insomnia 01/09/2015  . HSV-1 (herpes simplex virus 1) infection 01/09/2015  . Depression, major, in remission ([place])   . Anxiety   . Glaucoma   . Arthritis   . History of squamous cell carcinoma    Allergies Allergies  Allergen Reactions  . Codeine     REACTION: Itch/nausea/vomiting  . Meperidine Hcl     REACTION: itch  . Morphine     REACTION: tachycardia    SURGICAL HISTORY She  has a past surgical history that includes Abdominal hysterectomy; Breast surgery; Cervical fusion (2012); Squamous cell carcinoma excision; Anterior cruciate ligament repair (Bilateral, 1998); and Augmentation mammaplasty (Bilateral). FAMILY HISTORY Her family history includes Diabetes in her mother; Glaucoma in her mother; Heart attack in her father; Heart disease in her mother; Hypertension in her mother. SOCIAL HISTORY She  reports that she has never smoked. She has never used smokeless tobacco. She reports that she drinks alcohol. She reports that she does not use drugs.  MEDICARE WELLNESS OBJECTIVES: Physical activity: Current Exercise Habits: Home exercise routine;Structured exercise class, Type of  exercise: strength training/weights;walking, Time (Minutes): 30, Frequency (Times/Week): 5, Weekly Exercise (Minutes/Week): 150, Intensity: Moderate Cardiac risk factors: Cardiac Risk Factors include: advanced age (>60mn, >>52women) Depression/mood screen:   Depression screen [facility]2/[facility] 01/21/2018  Decreased Interest 0  Down, Depressed, Hopeless 0  PHQ - 2 Score 0    ADLs:  In your present state of health, do you have any difficulty performing the following activities: 01/21/2018 07/29/2017  Hearing? N N  Vision? N N  Difficulty concentrating or making decisions? N N  Walking or climbing stairs? N N  Dressing or bathing? N N  Doing errands, shopping? N N  Some recent data might be hidden      Cognitive Testing  Alert? Yes  Normal Appearance?Yes  Oriented to person? Yes  Place? Yes   Time? Yes  Recall of three objects?  Yes  Can perform simple calculations? Yes  Displays appropriate judgment?Yes  Can read the correct time from a watch face?Yes  EOL planning: Does Patient Have a Medical Advance Directive?: Yes Does patient want to make changes to medical advance directive?: No - Patient declined    Review of Systems  Constitutional: Negative.   HENT: Negative.   Eyes: Negative.   Respiratory: Negative.   Cardiovascular: Negative.   Gastrointestinal: Negative.   Genitourinary: Negative.   Musculoskeletal: Negative.   Skin: Negative.   Neurological: Negative.   Endo/Heme/Allergies: Negative.   Psychiatric/Behavioral: Negative.     Physical Exam: Estimated body mass index is 17.76 kg/m as calculated from the following:   Height as of this encounter: 5' 5.5" (1.664 m).   Weight as of this encounter: 108 lb 6.4 oz (49.2 kg). Vitals:   01/21/18 1407  BP: 112/76  Pulse: 77  Resp: 14  Temp: (!) 97.3 F (36.3 C)  SpO2: 97%   General Appearance: Well nourished, in no apparent distress. Eyes: PERRLA, EOMs, conjunctiva no swelling or erythema, normal fundi and vessels. Sinuses: No Frontal/maxillary tenderness ENT/Mouth: Ext aud canals clear, normal light reflex with TMs without erythema, bulging.  Good dentition. No erythema, swelling, or exudate on post pharynx. Tonsils not swollen or erythematous. Hearing normal.  Neck: Supple, thyroid normal. No bruits Respiratory: Respiratory effort normal, BS equal bilaterally without rales, rhonchi, wheezing or stridor. Cardio: RRR without murmurs, rubs or gallops. Brisk peripheral pulses without edema.  Chest: symmetric, with normal excursions and percussion. Breasts: Symmetric, without lumps, nipple discharge, retractions. + bilateral implants Abdomen: Soft, +BS. Non tender, no guarding, rebound, hernias, masses, or  organomegaly. .  Lymphatics: Non tender without lymphadenopathy.  Genitourinary: defer Musculoskeletal: Full ROM all peripheral extremities,5/5 strength, and normal gait.  Skin: Warm, dry without rashes, lesions, ecchymosis.  Neuro: Cranial nerves intact, reflexes equal bilaterally. Normal muscle tone, no cerebellar symptoms. Sensation intact.  Psych: Awake and oriented X 3, normal affect, Insight and Judgment appropriate.   Medicare Attestation I have personally reviewed: The patient's medical and social history Their use of alcohol, tobacco or illicit drugs Their current medications and supplements The patient's functional ability including ADLs,fall risks, home safety risks, cognitive, and hearing and visual impairment Diet and physical activities Evidence for depression or mood disorders  The patient's weight, height, BMI, and visual acuity have been recorded in the chart.  I have made referrals, counseling, and provided education to the patient based on review of the above and I have provided the patient with a written personalized care plan for preventive services.   AVicie Mutters2:46 PM

## 2018-01-21 ENCOUNTER — Other Ambulatory Visit: Payer: Self-pay

## 2018-01-21 ENCOUNTER — Encounter: Payer: Self-pay | Admitting: Physician Assistant

## 2018-01-21 ENCOUNTER — Ambulatory Visit (INDEPENDENT_AMBULATORY_CARE_PROVIDER_SITE_OTHER): Payer: PPO | Admitting: Physician Assistant

## 2018-01-21 VITALS — BP 112/76 | HR 77 | Temp 97.3°F | Resp 14 | Ht 65.5 in | Wt 108.4 lb

## 2018-01-21 DIAGNOSIS — K219 Gastro-esophageal reflux disease without esophagitis: Secondary | ICD-10-CM

## 2018-01-21 DIAGNOSIS — Z8589 Personal history of malignant neoplasm of other organs and systems: Secondary | ICD-10-CM

## 2018-01-21 DIAGNOSIS — E039 Hypothyroidism, unspecified: Secondary | ICD-10-CM | POA: Diagnosis not present

## 2018-01-21 DIAGNOSIS — H409 Unspecified glaucoma: Secondary | ICD-10-CM

## 2018-01-21 DIAGNOSIS — Z0000 Encounter for general adult medical examination without abnormal findings: Secondary | ICD-10-CM

## 2018-01-21 DIAGNOSIS — M8589 Other specified disorders of bone density and structure, multiple sites: Secondary | ICD-10-CM

## 2018-01-21 DIAGNOSIS — G8929 Other chronic pain: Secondary | ICD-10-CM

## 2018-01-21 DIAGNOSIS — M1990 Unspecified osteoarthritis, unspecified site: Secondary | ICD-10-CM | POA: Diagnosis not present

## 2018-01-21 DIAGNOSIS — Z13220 Encounter for screening for lipoid disorders: Secondary | ICD-10-CM

## 2018-01-21 DIAGNOSIS — R6889 Other general symptoms and signs: Secondary | ICD-10-CM

## 2018-01-21 DIAGNOSIS — Z681 Body mass index (BMI) 19 or less, adult: Secondary | ICD-10-CM | POA: Diagnosis not present

## 2018-01-21 DIAGNOSIS — Z0001 Encounter for general adult medical examination with abnormal findings: Secondary | ICD-10-CM | POA: Diagnosis not present

## 2018-01-21 DIAGNOSIS — Z79899 Other long term (current) drug therapy: Secondary | ICD-10-CM

## 2018-01-21 DIAGNOSIS — Z136 Encounter for screening for cardiovascular disorders: Secondary | ICD-10-CM

## 2018-01-21 DIAGNOSIS — D649 Anemia, unspecified: Secondary | ICD-10-CM | POA: Diagnosis not present

## 2018-01-21 DIAGNOSIS — B009 Herpesviral infection, unspecified: Secondary | ICD-10-CM

## 2018-01-21 DIAGNOSIS — M546 Pain in thoracic spine: Secondary | ICD-10-CM

## 2018-01-21 DIAGNOSIS — G4700 Insomnia, unspecified: Secondary | ICD-10-CM | POA: Diagnosis not present

## 2018-01-21 DIAGNOSIS — F325 Major depressive disorder, single episode, in full remission: Secondary | ICD-10-CM

## 2018-01-21 DIAGNOSIS — E559 Vitamin D deficiency, unspecified: Secondary | ICD-10-CM | POA: Diagnosis not present

## 2018-01-21 DIAGNOSIS — R2689 Other abnormalities of gait and mobility: Secondary | ICD-10-CM

## 2018-01-21 DIAGNOSIS — J449 Chronic obstructive pulmonary disease, unspecified: Secondary | ICD-10-CM

## 2018-01-21 DIAGNOSIS — I10 Essential (primary) hypertension: Secondary | ICD-10-CM | POA: Diagnosis not present

## 2018-01-21 DIAGNOSIS — F419 Anxiety disorder, unspecified: Secondary | ICD-10-CM | POA: Diagnosis not present

## 2018-01-21 DIAGNOSIS — Z1389 Encounter for screening for other disorder: Secondary | ICD-10-CM

## 2018-01-21 MED ORDER — AZELASTINE HCL 0.1 % NA SOLN: 2.0000 | Freq: Two times a day (BID) | NASAL | 2 refills | Status: DC

## 2018-01-21 MED ORDER — LEVOTHYROXINE SODIUM 50 MCG PO TABS: ORAL_TABLET | ORAL | 3 refills | Status: DC

## 2018-01-21 MED ORDER — RANITIDINE HCL 300 MG PO TABS: ORAL_TABLET | ORAL | 1 refills | Status: DC

## 2018-01-21 MED ORDER — ESCITALOPRAM OXALATE 20 MG PO TABS: 20.0000 mg | ORAL_TABLET | Freq: Every day | ORAL | 3 refills | Status: DC

## 2018-01-21 MED ORDER — LORAZEPAM 2 MG PO TABS: ORAL_TABLET | ORAL | 0 refills | Status: DC

## 2018-01-21 MED ORDER — CYCLOBENZAPRINE HCL 10 MG PO TABS: 10.0000 mg | ORAL_TABLET | Freq: Three times a day (TID) | ORAL | 0 refills | Status: DC | PRN

## 2018-01-21 MED ORDER — VALACYCLOVIR HCL 1 G PO TABS: 1000.0000 mg | ORAL_TABLET | Freq: Two times a day (BID) | ORAL | 0 refills | Status: DC

## 2018-01-21 NOTE — Patient Instructions (Addendum)
Due for colonoscopy this year after June.    Here are things you can do to help with this: - Try the Flonase or Nasonex. Remember to spray each nostril twice towards the outer part of your eye.  Do not sniff but instead pinch your nose and tilt your head back to help the medicine get into your sinuses.  The best time to do this is at bedtime.Stop if you get blurred vision or nose bleeds.  -While drinking fluids, pinch and hold nose close and swallow, to help open eustachian tubes to drain fluid behind ear drums. -Please pick one of the over the counter allergy medications below and take it once daily for allergies.  It will also help with fluid behind ear drums. Claritin or loratadine cheapest but likely the weakest  Zyrtec or certizine at night because it can make you sleepy The strongest is allegra or fexafinadine  Cheapest at [employer], [employer], [employer] -can use decongestant over the counter, please do not use if you have high blood pressure or certain heart conditions.   if worsening HA, changes vision/speech, imbalance, weakness go to the ER   Vertigo Vertigo means that you feel like you are moving when you are not. Vertigo can also make you feel like things around you are moving when they are not. This feeling can come and go at any time. Vertigo often goes away on its own. Follow these instructions at home:  Avoid making fast movements.  Avoid driving.  Avoid using heavy machinery.  Avoid doing any task or activity that might cause danger to you or other people if you would have a vertigo attack while you are doing it.  Sit down right away if you feel dizzy or have trouble with your balance.  Take over-the-counter and prescription medicines only as told by your doctor.  Follow instructions from your doctor about which positions or movements you should avoid.  Drink enough fluid to keep your pee (urine) clear or pale yellow.  Keep all follow-up visits as told by your doctor. This  is important. Contact a doctor if:  Medicine does not help your vertigo.  You have a fever.  Your problems get worse or you have new symptoms.  Your family or friends see changes in your behavior.  You feel sick to your stomach (nauseous) or you throw up (vomit).  You have a "pins and needles" feeling or you are numb in part of your body. Get help right away if:  You have trouble moving or talking.  You are always dizzy.  You pass out (faint).  You get very bad headaches.  You feel weak or have trouble using your hands, arms, or legs.  You have changes in your hearing.  You have changes in your seeing (vision).  You get a stiff neck.  Bright light starts to bother you. This information is not intended to replace advice given to you by your health care provider. Make sure you discuss any questions you have with your health care provider. Document Released: 07/02/2008 Document Revised: 02/29/2016 Document Reviewed: 01/16/2015 Elsevier Interactive Patient Education  [employer].

## 2018-01-22 LAB — URINALYSIS, ROUTINE W REFLEX MICROSCOPIC
Bilirubin Urine: NEGATIVE
Glucose, UA: NEGATIVE
Hgb urine dipstick: NEGATIVE
Ketones, ur: NEGATIVE
LEUKOCYTES UA: NEGATIVE
NITRITE: NEGATIVE
Protein, ur: NEGATIVE
SPECIFIC GRAVITY, URINE: 1.014 (ref 1.001–1.03)

## 2018-01-22 LAB — CBC WITH DIFFERENTIAL/PLATELET
BASOS ABS: 11 {cells}/uL (ref 0–200)
BASOS PCT: 0.2 %
EOS ABS: 280 {cells}/uL (ref 15–500)
Eosinophils Relative: 5 %
HCT: 38.8 % (ref 35.0–45.0)
HEMOGLOBIN: 13.4 g/dL (ref 11.7–15.5)
Lymphs Abs: 2380 cells/uL (ref 850–3900)
MCH: 36.6 pg — ABNORMAL HIGH (ref 27.0–33.0)
MCHC: 34.5 g/dL (ref 32.0–36.0)
MCV: 106 fL — ABNORMAL HIGH (ref 80.0–100.0)
MONOS PCT: 9.7 %
MPV: 9.3 fL (ref 7.5–12.5)
NEUTROS PCT: 42.6 %
Neutro Abs: 2386 cells/uL (ref 1500–7800)
Platelets: 271 10*3/uL (ref 140–400)
RBC: 3.66 10*6/uL — AB (ref 3.80–5.10)
RDW: 11.6 % (ref 11.0–15.0)
TOTAL LYMPHOCYTE: 42.5 %
WBC: 5.6 10*3/uL (ref 3.8–10.8)
WBCMIX: 543 {cells}/uL (ref 200–950)

## 2018-01-22 LAB — LIPID PANEL
CHOLESTEROL: 230 mg/dL — AB (ref <200)
HDL: 88 mg/dL (ref >50)
LDL Cholesterol (Calc): 120 mg/dL (calc) — ABNORMAL HIGH
Non-HDL Cholesterol (Calc): 142 mg/dL (calc) — ABNORMAL HIGH (ref <130)
TRIGLYCERIDES: 114 mg/dL (ref <150)
Total CHOL/HDL Ratio: 2.6 (calc) (ref <5.0)

## 2018-01-22 LAB — COMPLETE METABOLIC PANEL WITHOUT GFR
AG Ratio: 1.8 (calc) (ref 1.0–2.5)
ALT: 24 U/L (ref 6–29)
AST: 30 U/L (ref 10–35)
Albumin: 4.2 g/dL (ref 3.6–5.1)
Alkaline phosphatase (APISO): 56 U/L (ref 33–130)
BUN: 25 mg/dL (ref 7–25)
CO2: 28 mmol/L (ref 20–32)
Calcium: 9.3 mg/dL (ref 8.6–10.4)
Chloride: 103 mmol/L (ref 98–110)
Creat: 0.71 mg/dL (ref 0.50–0.99)
GFR, Est African American: 102 mL/min/1.73m2
GFR, Est Non African American: 88 mL/min/1.73m2
Globulin: 2.3 g/dL (ref 1.9–3.7)
Glucose, Bld: 79 mg/dL (ref 65–99)
Potassium: 4.7 mmol/L (ref 3.5–5.3)
Sodium: 136 mmol/L (ref 135–146)
Total Bilirubin: 0.3 mg/dL (ref 0.2–1.2)
Total Protein: 6.5 g/dL (ref 6.1–8.1)

## 2018-01-22 LAB — TSH: TSH: 1.94 mIU/L (ref 0.40–4.50)

## 2018-01-22 LAB — VITAMIN D 25 HYDROXY (VIT D DEFICIENCY, FRACTURES): Vit D, 25-Hydroxy: 114 ng/mL — ABNORMAL HIGH (ref 30–100)

## 2018-01-22 LAB — IRON, TOTAL/TOTAL IRON BINDING CAP
%SAT: 34 % (ref 11–50)
Iron: 97 ug/dL (ref 45–160)
TIBC: 283 ug/dL (ref 250–450)

## 2018-01-22 LAB — VITAMIN B12

## 2018-01-22 LAB — MICROALBUMIN / CREATININE URINE RATIO
CREATININE, URINE: 43 mg/dL (ref 20–275)
MICROALB UR: 0.2 mg/dL
MICROALB/CREAT RATIO: 5 ug/mg{creat} (ref <30)

## 2018-01-22 LAB — MAGNESIUM: Magnesium: 2 mg/dL (ref 1.5–2.5)

## 2018-01-28 ENCOUNTER — Ambulatory Visit: Payer: PPO | Admitting: Physical Therapy

## 2018-02-05 ENCOUNTER — Other Ambulatory Visit: Payer: Self-pay

## 2018-02-05 ENCOUNTER — Encounter: Payer: Self-pay | Admitting: Physical Therapy

## 2018-02-05 ENCOUNTER — Ambulatory Visit: Payer: PPO | Attending: Physician Assistant | Admitting: Physical Therapy

## 2018-02-05 DIAGNOSIS — M546 Pain in thoracic spine: Secondary | ICD-10-CM

## 2018-02-05 DIAGNOSIS — M62838 Other muscle spasm: Secondary | ICD-10-CM | POA: Insufficient documentation

## 2018-02-05 DIAGNOSIS — R293 Abnormal posture: Secondary | ICD-10-CM

## 2018-02-05 NOTE — Therapy (Signed)
[place], [place], [address] Phone: [phone]   Fax:  [phone]  Physical Therapy Evaluation  Patient Details  Name: Breanna Sparks MRN: [id number] Date of Birth: January 23, 1951 Referring Provider: Vicie Mutters, PA-C   Encounter Date: 02/05/2018  PT End of Session - 02/05/18 1020    Visit Number  1    Number of Visits  13    Date for PT Re-Evaluation  03/19/18    Authorization Type  MCR: KX mod by 15th visit, and progress not by 10th visit    PT Start Time  1020    PT Stop Time  1101    PT Time Calculation (min)  41 min    Activity Tolerance  Patient tolerated treatment well    Behavior During Therapy  [facility] for tasks assessed/performed       Past Medical History:  Diagnosis Date  . Anxiety   . Arthritis   . Cancer ([place])   . Depression   . GERD (gastroesophageal reflux disease)   . Glaucoma   . History of squamous cell carcinoma   . TMJ (temporomandibular joint syndrome)    wears gaurd at night    Past Surgical History:  Procedure Laterality Date  . ABDOMINAL HYSTERECTOMY     age 24  . ANTERIOR CRUCIATE LIGAMENT REPAIR Bilateral 1998  . AUGMENTATION MAMMAPLASTY Bilateral    saline  . BREAST SURGERY     implants  . CERVICAL FUSION  2012   Dr. Ellene Route  . SQUAMOUS CELL CARCINOMA EXCISION     Multiple    There were no vitals filed for this visit.   Subjective Assessment - 02/05/18 1024    Subjective  pt is a 67 y.o F with CC of chronic L mid/ low back that has been going on since she was 13. reports having hx of scoliosis,a nd reports she had strained her psoas and QL on the L. she reports she does Yoga to help relieve tension. reports pain stains in the back and denies any referral. pain fluctuates depending on activity.     How long can you sit comfortably?  unlimited    How long can you stand comfortably?  unlimited    How long can you walk comfortably?  unlimited     Diagnostic tests  nothing regarding back    Patient Stated Goals  decrease the muslce tension,     Currently in Pain?  Yes    Pain Score  7  at worst 8/10,     Pain Location  Thoracic    Pain Orientation  Left;Mid    Pain Descriptors / Indicators  Spasm;Tightness    Pain Type  Chronic pain    Pain Onset  More than a month ago    Pain Frequency  Intermittent    Aggravating Factors   laying down at night, over work out, running    Pain Relieving Factors  trigger point release using tennis ball,          [facility] PT Assessment - 02/05/18 1019      Assessment   Medical Diagnosis  thoracic spine pain and scoliosis    Referring Provider  Vicie Mutters, PA-C    Onset Date/Surgical Date  -- for many years    Hand Dominance  Right    Next MD Visit  6 months    Prior Therapy  yes      Precautions   Precautions  None  Restrictions   Weight Bearing Restrictions  No      Balance Screen   Has the patient fallen in the past 6 months  Yes    How many times?  1    Has the patient had a decrease in activity level because of a fear of falling?   No    Is the patient reluctant to leave their home because of a fear of falling?   No      Home Environment   Living Environment  Private residence    Living Arrangements  Spouse/significant other    Available Help at Discharge  Family;Available PRN/intermittently    Type of Home  House    Home Access  Level entry    Home Layout  One level    Home Equipment  None      Prior Function   Level of Independence  Independent with basic ADLs    Vocation  Retired    Leisure  Yoga, training, skin care      Cognition   Overall Cognitive Status  Within Functional Limits for tasks assessed      Observation/Other Assessments   Focus on Therapeutic Outcomes (FOTO)   33% limited predicted 31% limited      Posture/Postural Control   Posture/Postural Control  Postural limitations    Postural Limitations  Forward head;Decreased lumbar  lordosis;Decreased thoracic kyphosis    Posture Comments  R thoracic scoliosis      ROM / Strength   AROM / PROM / Strength  AROM;Strength;PROM      AROM   AROM Assessment Site  Lumbar;Thoracic    Lumbar Flexion  120    Lumbar Extension  30    Lumbar - Right Side Bend  12    Lumbar - Left Side Bend  14    Thoracic Flexion  25    Thoracic Extension  50    Thoracic - Right Side Bend  14 ERP    Thoracic - Left Side Bend  26 ERP      Strength   Strength Assessment Site  Shoulder    Right/Left Shoulder  Right;Left      Palpation   Spinal mobility  hypomobility of L1-L5 PAIVM    Palpation comment  TTP along L thoracolumbar paraspinals and T6-T11 with R scoliosis.                Objective measurements completed on examination: See above findings.      [facility] Adult PT Treatment/Exercise - 02/05/18 1019      Manual Therapy   Manual therapy comments  skilled palpation monitoring throught TPDN       Trigger Point Dry Needling - 02/05/18 1150    Consent Given?  Yes    Education Handout Provided  Yes    Muscles Treated Upper Body  Longissimus    Longissimus Response  Twitch response elicited;Palpable increased muscle length along L T6-T9           PT Education - 02/05/18 1025    Education provided  Yes    Education Details  evaluation findings, POC, goals, HEP with proper form/ rationale, Anatomy of the spine. muscle anatomy and referral patterns, What TPDN is, benefits/ what to expect.    Person(s) Educated  Patient    Methods  Explanation;Verbal cues    Comprehension  Verbalized understanding;Verbal cues required       PT Short Term Goals - 02/05/18 1243      PT SHORT TERM  GOAL #1   Title  pt to be I with inital HEP     Time  3    Period  Weeks    Status  New    Target Date  02/26/18      PT SHORT TERM GOAL #2   Title  pt to verbalize/ demo proper posture and lifting mechanics to preven and reduce back pain     Time  3    Period  Weeks    Status   New    Target Date  02/26/18        PT Long Term Goals - 02/05/18 1243      PT LONG TERM GOAL #1   Title  pt to demonstrate reduced thoracic paraspinal spasm to promote  trunk mobility reporting </= 1/10 pain     Time  6    Period  Weeks    Status  New    Target Date  03/19/18      PT LONG TERM GOAL #2   Title  pt to improve core strength in the core and promote R thoracolumbar strengthening to promote posture correction and reduce L paraspinal spasm    Time  6    Period  Weeks    Status  New    Target Date  03/19/18      PT LONG TERM GOAL #3   Title  increase FOTO Score to </= 30% limited to demo improvement in function    Time  6    Period  Weeks    Status  New    Target Date  03/19/18      PT LONG TERM GOAL #4   Title  pt to be I with all HEP and is able to maintain and progress current level of function     Time  6    Period  Weeks    Status  New    Target Date  03/19/18             Plan - 02/05/18 1203    Clinical Impression Statement  pt presents to OPPT with CC of chronic mid back pain with hx or R thoracic scoliosis. She exhibits abnormal posture with reduced lumbar and thoracic curves, with limited lumbar flexion compated to thoracic spine. TTP along the thoracic spine parapsinals L>R. TPDN was educated and performed on the thoracic parapsinals which she reported significant relief of pain and tightness. end of session she reported decreased pain and improvement in mobility. She would benefit from physical therapy to promote posutre, reduce muscle spasm, improve core strength and maximize funciton by addressing the deficits listed below.      History and Personal Factors relevant to plan of care:  hx of depression, arthritis    Clinical Presentation  Evolving    Clinical Presentation due to:  limited trunk mobility. muscle spasm, recent increase of pain inthe low back fluctuating nature    Clinical Decision Making  Moderate    Rehab Potential  Good    PT  Frequency  2x / week    PT Duration  6 weeks    PT Treatment/Interventions  ADLs/Self Care Home Management;Electrical Stimulation;Iontophoresis 4mg /ml Dexamethasone;Cryotherapy;Moist Heat;Passive range of motion;Dry needling;Taping;Therapeutic activities;Therapeutic exercise;Manual techniques;Ultrasound;Balance training;Patient/family education    PT Next Visit Plan  review/ update HEP, How as DN, R core activation, thoracic mobs,     PT Home Exercise Plan  seated thoracic rotation, seated pelvic tilt, childs pose reaching to the R  Consulted and Agree with Plan of Care  Patient       Patient will benefit from skilled therapeutic intervention in order to improve the following deficits and impairments:  Pain, Increased muscle spasms, Postural dysfunction, Improper body mechanics, Decreased activity tolerance, Decreased endurance, Decreased mobility  Visit Diagnosis: Pain in thoracic spine  Abnormal posture  Other muscle spasm     Problem List Patient Active Problem List   Diagnosis Date Noted  . COPD (chronic obstructive pulmonary disease) ([place]) 08/08/2017  . Gastroesophageal reflux disease without esophagitis 01/10/2016  . Thyroid activity decreased 01/10/2016  . Osteopenia 01/10/2016  . Encounter for Medicare annual wellness exam 07/25/2015  . Lower back pain 01/09/2015  . Insomnia 01/09/2015  . HSV-1 (herpes simplex virus 1) infection 01/09/2015  . Depression, major, in remission ([place])   . Anxiety   . Glaucoma   . Arthritis   . History of squamous cell carcinoma     Starr Lake PT, DPT, LAT, ATC  02/05/18  12:48 PM      [facility] [address] [place], [place], [address] Phone: [phone]   Fax:  [phone]  Name: Breanna Sparks MRN: [id number] Date of Birth: 17-Mar-1951

## 2018-02-13 ENCOUNTER — Encounter

## 2018-02-18 ENCOUNTER — Encounter: Payer: PPO | Admitting: Physical Therapy

## 2018-02-24 DIAGNOSIS — D0471 Carcinoma in situ of skin of right lower limb, including hip: Secondary | ICD-10-CM | POA: Diagnosis not present

## 2018-02-24 DIAGNOSIS — Z85820 Personal history of malignant melanoma of skin: Secondary | ICD-10-CM | POA: Diagnosis not present

## 2018-02-24 DIAGNOSIS — L821 Other seborrheic keratosis: Secondary | ICD-10-CM | POA: Diagnosis not present

## 2018-02-24 DIAGNOSIS — D485 Neoplasm of uncertain behavior of skin: Secondary | ICD-10-CM | POA: Diagnosis not present

## 2018-02-24 DIAGNOSIS — L905 Scar conditions and fibrosis of skin: Secondary | ICD-10-CM | POA: Diagnosis not present

## 2018-02-24 DIAGNOSIS — D2272 Melanocytic nevi of left lower limb, including hip: Secondary | ICD-10-CM | POA: Diagnosis not present

## 2018-02-24 DIAGNOSIS — D225 Melanocytic nevi of trunk: Secondary | ICD-10-CM | POA: Diagnosis not present

## 2018-02-24 DIAGNOSIS — L570 Actinic keratosis: Secondary | ICD-10-CM | POA: Diagnosis not present

## 2018-02-24 DIAGNOSIS — Z85828 Personal history of other malignant neoplasm of skin: Secondary | ICD-10-CM | POA: Diagnosis not present

## 2018-02-24 DIAGNOSIS — Z419 Encounter for procedure for purposes other than remedying health state, unspecified: Secondary | ICD-10-CM | POA: Diagnosis not present

## 2018-02-25 ENCOUNTER — Ambulatory Visit: Payer: PPO | Admitting: Physical Therapy

## 2018-02-25 ENCOUNTER — Encounter: Payer: Self-pay | Admitting: Physical Therapy

## 2018-02-25 DIAGNOSIS — M546 Pain in thoracic spine: Secondary | ICD-10-CM

## 2018-02-25 DIAGNOSIS — R293 Abnormal posture: Secondary | ICD-10-CM

## 2018-02-25 DIAGNOSIS — M62838 Other muscle spasm: Secondary | ICD-10-CM

## 2018-02-25 NOTE — Therapy (Signed)
[place], [place], [address] Phone: [phone]   Fax:  [phone]  Physical Therapy Treatment  Patient Details  Name: Breanna Sparks MRN: [id number] Date of Birth: [birth year] Referring Provider: Vicie Mutters, PA-C   Encounter Date: 02/25/2018  PT End of Session - 02/25/18 1108    Visit Number  2    Number of Visits  13    Date for PT Re-Evaluation  03/19/18    Authorization Type  MCR: KX mod by 15th visit, and progress not by 10th visit    PT Start Time  1101    PT Stop Time  1142    PT Time Calculation (min)  41 min    Activity Tolerance  Patient tolerated treatment well    Behavior During Therapy  [facility] for tasks assessed/performed       Past Medical History:  Diagnosis Date  . Anxiety   . Arthritis   . Cancer ([place])   . Depression   . GERD (gastroesophageal reflux disease)   . Glaucoma   . History of squamous cell carcinoma   . TMJ (temporomandibular joint syndrome)    wears gaurd at night    Past Surgical History:  Procedure Laterality Date  . ABDOMINAL HYSTERECTOMY     age 73  . ANTERIOR CRUCIATE LIGAMENT REPAIR Bilateral 1998  . AUGMENTATION MAMMAPLASTY Bilateral    saline  . BREAST SURGERY     implants  . CERVICAL FUSION  2012   Dr. Ellene Route  . SQUAMOUS CELL CARCINOMA EXCISION     Multiple    There were no vitals filed for this visit.  Subjective Assessment - 02/25/18 1103    Subjective  "The last session really helped, it has calmed down alot. I have been practicing good posture"     Currently in Pain?  Yes    Pain Score  4     Pain Orientation  Left;Mid    Pain Type  Chronic pain    Pain Onset  More than a month ago    Pain Frequency  Intermittent    Aggravating Factors   moving the wrong way,     Pain Relieving Factors  DN,                        [facility] Adult PT Treatment/Exercise - 02/25/18 1116      Lumbar Exercises: Standing   Other  Standing Lumbar Exercises  R paraspinal wall side plank    Other Standing Lumbar Exercises  bil UE press down with ball to the R and body facing forward 2 x 10 with breathing out for improved core contrcation      Lumbar Exercises: Seated   Other Seated Lumbar Exercises  horizontal lift/ chop 2 x 10 with red theraband to the l only for R core activation      Knee/Hip Exercises: Stretches   Other Knee/Hip Stretches  QL stretch 2 x 30 R sidelying      Manual Therapy   Manual Therapy  Soft tissue mobilization;Taping    Manual therapy comments  skilled palpation monitoring throught TPDN    Soft tissue mobilization  IASTM along the L QL and thoracolumbar paraspinals     Kinesiotex  Inhibit Muscle;Facilitate Muscle      Kinesiotix   Inhibit Muscle   L Paraspinals    Facilitate Muscle   R paraspinals       Trigger Point Dry Needling -  02/25/18 1108    Consent Given?  Yes    Education Handout Provided  No    Muscles Treated Upper Body  Quadratus Lumborum L QL     Longissimus Response  Twitch response elicited;Palpable increased muscle length LT6-T9             PT Short Term Goals - 02/05/18 1243      PT SHORT TERM GOAL #1   Title  pt to be I with inital HEP     Time  3    Period  Weeks    Status  New    Target Date  02/26/18      PT SHORT TERM GOAL #2   Title  pt to verbalize/ demo proper posture and lifting mechanics to preven and reduce back pain     Time  3    Period  Weeks    Status  New    Target Date  02/26/18        PT Long Term Goals - 02/05/18 1243      PT LONG TERM GOAL #1   Title  pt to demonstrate reduced thoracic paraspinal spasm to promote  trunk mobility reporting </= 1/10 pain     Time  6    Period  Weeks    Status  New    Target Date  03/19/18      PT LONG TERM GOAL #2   Title  pt to improve core strength in the core and promote R thoracolumbar strengthening to promote posture correction and reduce L paraspinal spasm    Time  6    Period   Weeks    Status  New    Target Date  03/19/18      PT LONG TERM GOAL #3   Title  increase FOTO Score to </= 30% limited to demo improvement in function    Time  6    Period  Weeks    Status  New    Target Date  03/19/18      PT LONG TERM GOAL #4   Title  pt to be I with all HEP and is able to maintain and progress current level of function     Time  6    Period  Weeks    Status  New    Target Date  03/19/18            Plan - 02/25/18 1143    Clinical Impression Statement  [name] reports significant relief of pain since the last session. continued TPDN on the paraspinals and QL today. Followed with IASTM techniques. continued core activation focusing on the R . trialed KT taping to promote R paraspinal activation and inhibit L paraspinals. end of session she reported nopain and declined modalities end of session     PT Treatment/Interventions  ADLs/Self Care Home Management;Electrical Stimulation;Iontophoresis 4mg /ml Dexamethasone;Cryotherapy;Moist Heat;Passive range of motion;Dry needling;Taping;Therapeutic activities;Therapeutic exercise;Manual techniques;Ultrasound;Balance training;Patient/family education    PT Next Visit Plan   update HEP, How as DN, R core activation, thoracic mobs, how was KT tape, physioball exercise, supine R UE reaching with controlled breahting out    PT Home Exercise Plan  seated thoracic rotation, seated pelvic tilt, childs pose reaching to the R    Consulted and Agree with Plan of Care  Patient       Patient will benefit from skilled therapeutic intervention in order to improve the following deficits and impairments:  Pain, Increased muscle spasms, Postural dysfunction,  Improper body mechanics, Decreased activity tolerance, Decreased endurance, Decreased mobility  Visit Diagnosis: Pain in thoracic spine  Abnormal posture  Other muscle spasm     Problem List Patient Active Problem List   Diagnosis Date Noted  . COPD (chronic  obstructive pulmonary disease) ([place]) 08/08/2017  . Gastroesophageal reflux disease without esophagitis 01/10/2016  . Thyroid activity decreased 01/10/2016  . Osteopenia 01/10/2016  . Encounter for Medicare annual wellness exam 07/25/2015  . Lower back pain 01/09/2015  . Insomnia 01/09/2015  . HSV-1 (herpes simplex virus 1) infection 01/09/2015  . Depression, major, in remission ([place])   . Anxiety   . Glaucoma   . Arthritis   . History of squamous cell carcinoma    Starr Lake PT, DPT, LAT, ATC  02/25/18  11:46 AM      [facility] [address] [place], [place], [address] Phone: [phone]   Fax:  [phone]  Name: Breanna Sparks MRN: [id number] Date of Birth: 1951/01/03

## 2018-02-27 ENCOUNTER — Encounter: Payer: Self-pay | Admitting: Physical Therapy

## 2018-02-27 ENCOUNTER — Ambulatory Visit: Payer: PPO | Admitting: Physical Therapy

## 2018-02-27 DIAGNOSIS — M546 Pain in thoracic spine: Secondary | ICD-10-CM

## 2018-02-27 DIAGNOSIS — R293 Abnormal posture: Secondary | ICD-10-CM

## 2018-02-27 DIAGNOSIS — M62838 Other muscle spasm: Secondary | ICD-10-CM

## 2018-02-27 NOTE — Therapy (Signed)
[place], [place], [address] Phone: [phone]   Fax:  [phone]  Physical Therapy Treatment  Patient Details  Name: Breanna Sparks MRN: [id number] Date of Birth: 15-Jan-1951 Referring Provider: Vicie Mutters, PA-C   Encounter Date: 02/27/2018  PT End of Session - 02/27/18 1057    Visit Number  3    Number of Visits  13    Date for PT Re-Evaluation  03/19/18    Authorization Type  MCR: KX mod by 15th visit, and progress not by 10th visit    PT Start Time  1016    PT Stop Time  1055    PT Time Calculation (min)  39 min    Activity Tolerance  Patient tolerated treatment well    Behavior During Therapy  [facility] for tasks assessed/performed       Past Medical History:  Diagnosis Date  . Anxiety   . Arthritis   . Cancer ([place])   . Depression   . GERD (gastroesophageal reflux disease)   . Glaucoma   . History of squamous cell carcinoma   . TMJ (temporomandibular joint syndrome)    wears gaurd at night    Past Surgical History:  Procedure Laterality Date  . ABDOMINAL HYSTERECTOMY     age 27  . ANTERIOR CRUCIATE LIGAMENT REPAIR Bilateral 1998  . AUGMENTATION MAMMAPLASTY Bilateral    saline  . BREAST SURGERY     implants  . CERVICAL FUSION  2012   Dr. Ellene Route  . SQUAMOUS CELL CARCINOMA EXCISION     Multiple    There were no vitals filed for this visit.  Subjective Assessment - 02/27/18 1020    Subjective  "doing better still some tightness in the QL but other wise I am doing good"     Currently in Pain?  Yes    Pain Score  2     Pain Orientation  Left;Mid;Lower    Pain Type  Chronic pain    Pain Onset  More than a month ago    Pain Frequency  Intermittent                       [facility] Adult PT Treatment/Exercise - 02/27/18 1031      Lumbar Exercises: Supine   Other Supine Lumbar Exercises  in table top position with RUE protraction and controlled breathing out  2 x 10       Knee/Hip Exercises: Stretches   Other Knee/Hip Stretches  QL stretch 2 x 30 R sidelying      Knee/Hip Exercises: Seated   Other Seated Knee/Hip Exercises  1/2 kneelingon airex pad 4 x 30 sec bil      Knee/Hip Exercises: Sidelying   Hip ABduction  2 sets;10 reps with CW/CCW motion x 10 ea.      Manual Therapy   Manual Therapy  Myofascial release    Manual therapy comments  skilled palpation monitoring throught TPDN    Soft tissue mobilization  IASTM along the L QL, and glute med    Myofascial Release  fascial rolling/ stretching of QL and paraspinals       Trigger Point Dry Needling - 02/27/18 1030    Consent Given?  Yes    Education Handout Provided  No    Muscles Treated Upper Body  Quadratus Lumborum R QL    Muscles Treated Lower Body  Gluteus minimus    Gluteus Minimus Response  Twitch response  elicited;Palpable increased muscle length glute med on the L             PT Short Term Goals - 02/27/18 1100      PT SHORT TERM GOAL #1   Title  pt to be I with inital HEP     Time  3    Period  Weeks    Status  Achieved      PT SHORT TERM GOAL #2   Title  pt to verbalize/ demo proper posture and lifting mechanics to preven and reduce back pain     Time  3    Period  Weeks    Status  Achieved        PT Long Term Goals - 02/05/18 1243      PT LONG TERM GOAL #1   Title  pt to demonstrate reduced thoracic paraspinal spasm to promote  trunk mobility reporting </= 1/10 pain     Time  6    Period  Weeks    Status  New    Target Date  03/19/18      PT LONG TERM GOAL #2   Title  pt to improve core strength in the core and promote R thoracolumbar strengthening to promote posture correction and reduce L paraspinal spasm    Time  6    Period  Weeks    Status  New    Target Date  03/19/18      PT LONG TERM GOAL #3   Title  increase FOTO Score to </= 30% limited to demo improvement in function    Time  6    Period  Weeks    Status  New    Target Date   03/19/18      PT LONG TERM GOAL #4   Title  pt to be I with all HEP and is able to maintain and progress current level of function     Time  6    Period  Weeks    Status  New    Target Date  03/19/18            Plan - 02/27/18 1058    Clinical Impression Statement  pt continues to report pain relief with PT. continued TPDN focusing on L QL and glute medius with IASTM and fascial release techniques. continued core strengthening and hip activation techniques which she perofrmed well. progress balance training focusing on hip strategy in 1/2 kneeling which she demonstrated difficulty with. no pain end of session.     PT Next Visit Plan   update HEP, How as DN, R core activation, thoracic mobs,  physioball exercise, supine R UE reaching with controlled breahting out, ankle/ hip strategies.     PT Home Exercise Plan  seated thoracic rotation, seated pelvic tilt, childs pose reaching to the R    Consulted and Agree with Plan of Care  Patient       Patient will benefit from skilled therapeutic intervention in order to improve the following deficits and impairments:  Pain, Increased muscle spasms, Postural dysfunction, Improper body mechanics, Decreased activity tolerance, Decreased endurance, Decreased mobility  Visit Diagnosis: Pain in thoracic spine  Abnormal posture  Other muscle spasm     Problem List Patient Active Problem List   Diagnosis Date Noted  . COPD (chronic obstructive pulmonary disease) ([place]) 08/08/2017  . Gastroesophageal reflux disease without esophagitis 01/10/2016  . Thyroid activity decreased 01/10/2016  . Osteopenia 01/10/2016  . Encounter for Medicare  annual wellness exam 07/25/2015  . Lower back pain 01/09/2015  . Insomnia 01/09/2015  . HSV-1 (herpes simplex virus 1) infection 01/09/2015  . Depression, major, in remission ([place])   . Anxiety   . Glaucoma   . Arthritis   . History of squamous cell carcinoma    Starr Lake PT, DPT, LAT, ATC   02/27/18  11:01 AM      [facility] [address] [place], [place], [address] Phone: [phone]   Fax:  [phone]  Name: Benigna Delisi MRN: [id number] Date of Birth: 08/24/1951

## 2018-03-03 DIAGNOSIS — H401131 Primary open-angle glaucoma, bilateral, mild stage: Secondary | ICD-10-CM | POA: Diagnosis not present

## 2018-03-04 ENCOUNTER — Ambulatory Visit: Payer: PPO | Admitting: Physical Therapy

## 2018-03-04 DIAGNOSIS — M546 Pain in thoracic spine: Secondary | ICD-10-CM | POA: Diagnosis not present

## 2018-03-04 DIAGNOSIS — M62838 Other muscle spasm: Secondary | ICD-10-CM

## 2018-03-04 DIAGNOSIS — R293 Abnormal posture: Secondary | ICD-10-CM

## 2018-03-04 NOTE — Therapy (Signed)
[place], [place], [address] Phone: [phone]   Fax:  [phone]  Physical Therapy Treatment  Patient Details  Name: Breanna Sparks MRN: [id number] Date of Birth: 1951/06/21 Referring Provider: Vicie Mutters, PA-C   Encounter Date: 03/04/2018  PT End of Session - 03/04/18 1132    Visit Number  4    Number of Visits  13    Date for PT Re-Evaluation  03/19/18    Authorization Type  MCR: KX mod by 15th visit, and progress not by 10th visit    PT Start Time  1016    PT Stop Time  1101    PT Time Calculation (min)  45 min    Activity Tolerance  Patient tolerated treatment well    Behavior During Therapy  Breanna Sparks for tasks assessed/performed       Past Medical History:  Diagnosis Date  . Anxiety   . Arthritis   . Cancer ([place])   . Depression   . GERD (gastroesophageal reflux disease)   . Glaucoma   . History of squamous cell carcinoma   . TMJ (temporomandibular joint syndrome)    wears gaurd at night    Past Surgical History:  Procedure Laterality Date  . ABDOMINAL HYSTERECTOMY     age 24  . ANTERIOR CRUCIATE LIGAMENT REPAIR Bilateral 1998  . AUGMENTATION MAMMAPLASTY Bilateral    saline  . BREAST SURGERY     implants  . CERVICAL FUSION  2012   Dr. Ellene Route  . SQUAMOUS CELL CARCINOMA EXCISION     Multiple    There were no vitals filed for this visit.  Subjective Assessment - 03/04/18 1022    Subjective  "i didn't do as much over the weekend"    Currently in Pain?  Yes    Pain Score  5     Pain Type  Chronic pain    Pain Onset  More than a month ago    Pain Frequency  Intermittent    Aggravating Factors   doing too much     Pain Relieving Factors  DN                        [facility] Adult PT Treatment/Exercise - 03/04/18 1031      Self-Care   Self-Care  Other Self-Care Comments    Other Self-Care Comments   MTPR using tools and where the tools can be found/  purchased      Lumbar Exercises: Aerobic   Nustep  L5 x 5 min RUE and bil LE only      Lumbar Exercises: Supine   Other Supine Lumbar Exercises  thoracic extension over foam roll with hands behind head and elbows adducted 1 x 10 with focus on exhalation with flexion and inhalation with extension    Other Supine Lumbar Exercises  supine foam roll routine: ceiling punches, horizontal abd/ add, alternating cieling punches, x to Y and back stroke 1 x 15 ea.       Lumbar Exercises: Sidelying   Other Sidelying Lumbar Exercises  book opening 1 x 15 bil             PT Education - 03/04/18 1132    Education provided  Yes    Education Details  updated thoracic rotation to sidelying and MTPR techniques and how to perform at home    Person(s) Educated  Patient    Methods  Explanation;Verbal cues;Handout  Comprehension  Verbal cues required;Verbalized understanding       PT Short Term Goals - 02/27/18 1100      PT SHORT TERM GOAL #1   Title  pt to be I with inital HEP     Time  3    Period  Weeks    Status  Achieved      PT SHORT TERM GOAL #2   Title  pt to verbalize/ demo proper posture and lifting mechanics to preven and reduce back pain     Time  3    Period  Weeks    Status  Achieved        PT Long Term Goals - 02/05/18 1243      PT LONG TERM GOAL #1   Title  pt to demonstrate reduced thoracic paraspinal spasm to promote  trunk mobility reporting </= 1/10 pain     Time  6    Period  Weeks    Status  New    Target Date  03/19/18      PT LONG TERM GOAL #2   Title  pt to improve core strength in the core and promote R thoracolumbar strengthening to promote posture correction and reduce L paraspinal spasm    Time  6    Period  Weeks    Status  New    Target Date  03/19/18      PT LONG TERM GOAL #3   Title  increase FOTO Score to </= 30% limited to demo improvement in function    Time  6    Period  Weeks    Status  New    Target Date  03/19/18      PT LONG  TERM GOAL #4   Title  pt to be I with all HEP and is able to maintain and progress current level of function     Time  6    Period  Weeks    Status  New    Target Date  03/19/18            Plan - 03/04/18 1133    Clinical Impression Statement  [name] reported increased soreness today likely due to do ing too much at home over the weekend. Educted on how to perform MTPR and continued thoracic mobility/ scapular exercises which she reported relief of pain and discomfort. end of session she declined modalities.     PT Next Visit Plan   update HEP, How as DN, R core activation, thoracic mobs,  physioball exercise, supine R UE reaching with controlled breahting out, ankle/ hip strategies.     PT Home Exercise Plan  seated thoracic rotation, seated pelvic tilt, childs pose reaching to the R, MTPR    Consulted and Agree with Plan of Care  Patient       Patient will benefit from skilled therapeutic intervention in order to improve the following deficits and impairments:     Visit Diagnosis: Pain in thoracic spine  Abnormal posture  Other muscle spasm     Problem List Patient Active Problem List   Diagnosis Date Noted  . COPD (chronic obstructive pulmonary disease) ([place]) 08/08/2017  . Gastroesophageal reflux disease without esophagitis 01/10/2016  . Thyroid activity decreased 01/10/2016  . Osteopenia 01/10/2016  . Encounter for Medicare annual wellness exam 07/25/2015  . Lower back pain 01/09/2015  . Insomnia 01/09/2015  . HSV-1 (herpes simplex virus 1) infection 01/09/2015  . Depression, major, in remission ([place])   .  Anxiety   . Glaucoma   . Arthritis   . History of squamous cell carcinoma    Breanna Sparks PT, DPT, LAT, ATC  03/04/18  11:36 AM      [facility] [address] [place], [place], [address] Phone: [phone]   Fax:  [phone]  Name: Breanna Sparks MRN: [id number] Date of Birth:  Jun 29, 1951

## 2018-03-05 ENCOUNTER — Ambulatory Visit: Payer: PPO | Admitting: Physical Therapy

## 2018-03-05 ENCOUNTER — Encounter: Payer: Self-pay | Admitting: Physical Therapy

## 2018-03-05 DIAGNOSIS — M546 Pain in thoracic spine: Secondary | ICD-10-CM

## 2018-03-05 DIAGNOSIS — R293 Abnormal posture: Secondary | ICD-10-CM

## 2018-03-05 DIAGNOSIS — M62838 Other muscle spasm: Secondary | ICD-10-CM

## 2018-03-05 NOTE — Therapy (Signed)
[place], [place], [address] Phone: [phone]   Fax:  [phone]  Physical Therapy Treatment  Patient Details  Name: Breanna Sparks MRN: [id number] Date of Birth: 09-Jan-1951 Referring Provider: Vicie Mutters, PA-C   Encounter Date: 03/05/2018  PT End of Session - 03/05/18 1018    Visit Number  5    Number of Visits  13    Date for PT Re-Evaluation  03/19/18    Authorization Type  MCR: KX mod by 15th visit, and progress not by 10th visit    PT Start Time  1017    PT Stop Time  1058    PT Time Calculation (min)  41 min    Activity Tolerance  Patient tolerated treatment well    Behavior During Therapy  [facility] for tasks assessed/performed       Past Medical History:  Diagnosis Date  . Anxiety   . Arthritis   . Cancer ([place])   . Depression   . GERD (gastroesophageal reflux disease)   . Glaucoma   . History of squamous cell carcinoma   . TMJ (temporomandibular joint syndrome)    wears gaurd at night    Past Surgical History:  Procedure Laterality Date  . ABDOMINAL HYSTERECTOMY     age 5  . ANTERIOR CRUCIATE LIGAMENT REPAIR Bilateral 1998  . AUGMENTATION MAMMAPLASTY Bilateral    saline  . BREAST SURGERY     implants  . CERVICAL FUSION  2012   Dr. Ellene Route  . SQUAMOUS CELL CARCINOMA EXCISION     Multiple    There were no vitals filed for this visit.  Subjective Assessment - 03/05/18 1019    Subjective  "I am sore today, after yesterday's session I went home and did more exercise"     Patient Stated Goals  decrease the muslce tension,     Currently in Pain?  Yes    Pain Score  5     Pain Orientation  Left         [facility] PT Assessment - 03/05/18 1026      Observation/Other Assessments   Focus on Therapeutic Outcomes (FOTO)   28% limited                   [facility] Adult PT Treatment/Exercise - 03/05/18 1030      Lumbar Exercises: Seated   Other Seated Lumbar  Exercises  seated pelvic tilt on physioball 2 x 10 (tactile cues for proper form. seated marching with sutainted pelvic tilt 2 x 10 )      Lumbar Exercises: Supine   Dead Bug  5 reps    Basic Lumbar Stabilization  10 reps x 2 sets    Other Supine Lumbar Exercises  leg lengthener with RLE leg press and holding LLE      Knee/Hip Exercises: Standing   Forward Lunges  1 set;10 reps cues for wider base and putting hands on L Knee for support      Manual Therapy   Manual Therapy  Muscle Energy Technique    Muscle Energy Technique  R isometric hip flexor MET 5 x 10 sec hold             PT Education - 03/05/18 1044    Education provided  Yes    Education Details  updated HEP for SLR and self innominate MET, effect of scoliosis on SIJ and leg length and benefits of strengthening to correct deficits to promote  leg length equality    Person(s) Educated  Patient    Methods  Explanation;Verbal cues;Handout    Comprehension  Verbalized understanding;Verbal cues required       PT Short Term Goals - 02/27/18 1100      PT SHORT TERM GOAL #1   Title  pt to be I with inital HEP     Time  3    Period  Weeks    Status  Achieved      PT SHORT TERM GOAL #2   Title  pt to verbalize/ demo proper posture and lifting mechanics to preven and reduce back pain     Time  3    Period  Weeks    Status  Achieved        PT Long Term Goals - 02/05/18 1243      PT LONG TERM GOAL #1   Title  pt to demonstrate reduced thoracic paraspinal spasm to promote  trunk mobility reporting </= 1/10 pain     Time  6    Period  Weeks    Status  New    Target Date  03/19/18      PT LONG TERM GOAL #2   Title  pt to improve core strength in the core and promote R thoracolumbar strengthening to promote posture correction and reduce L paraspinal spasm    Time  6    Period  Weeks    Status  New    Target Date  03/19/18      PT LONG TERM GOAL #3   Title  increase FOTO Score to </= 30% limited to demo  improvement in function    Time  6    Period  Weeks    Status  New    Target Date  03/19/18      PT LONG TERM GOAL #4   Title  pt to be I with all HEP and is able to maintain and progress current level of function     Time  6    Period  Weeks    Status  New    Target Date  03/19/18            Plan - 03/05/18 1102    Clinical Impression Statement  continued working on core strength and pt demonstates slight SIJ post innominate rotation on R which decreased with MET of hip flexors. practiced lunges to work toward getting up/ down from floor. end of session she reported no pain.     PT Next Visit Plan   update HEP,  R core activation, thoracic mobs,  physioball exercise, supine R UE reaching with controlled breahting out, ankle/ hip strategies. balance, getting up from the floor.     PT Home Exercise Plan  seated thoracic rotation, seated pelvic tilt, childs pose reaching to the R, MTPR    Consulted and Agree with Plan of Care  Patient       Patient will benefit from skilled therapeutic intervention in order to improve the following deficits and impairments:     Visit Diagnosis: Pain in thoracic spine  Abnormal posture  Other muscle spasm     Problem List Patient Active Problem List   Diagnosis Date Noted  . COPD (chronic obstructive pulmonary disease) ([place]) 08/08/2017  . Gastroesophageal reflux disease without esophagitis 01/10/2016  . Thyroid activity decreased 01/10/2016  . Osteopenia 01/10/2016  . Encounter for Medicare annual wellness exam 07/25/2015  . Lower back pain 01/09/2015  . Insomnia  01/09/2015  . HSV-1 (herpes simplex virus 1) infection 01/09/2015  . Depression, major, in remission ([place])   . Anxiety   . Glaucoma   . Arthritis   . History of squamous cell carcinoma    Starr Lake PT, DPT, LAT, ATC  03/05/18  11:05 AM      [facility] [address] [place], [place], [address] Phone:  [phone]   Fax:  [phone]  Name: Breanna Sparks MRN: [id number] Date of Birth: 1950-12-04

## 2018-03-10 ENCOUNTER — Encounter: Payer: Self-pay | Admitting: Physical Therapy

## 2018-03-10 ENCOUNTER — Ambulatory Visit: Payer: PPO | Attending: Physician Assistant | Admitting: Physical Therapy

## 2018-03-10 DIAGNOSIS — M62838 Other muscle spasm: Secondary | ICD-10-CM | POA: Diagnosis not present

## 2018-03-10 DIAGNOSIS — R293 Abnormal posture: Secondary | ICD-10-CM | POA: Diagnosis not present

## 2018-03-10 DIAGNOSIS — M546 Pain in thoracic spine: Secondary | ICD-10-CM

## 2018-03-10 NOTE — Therapy (Signed)
[place], [place], [address] Phone: [phone]   Fax:  [phone]  Physical Therapy Treatment  Patient Details  Name: Breanna Sparks MRN: [id number] Date of Birth: 1951/05/05 Referring Provider: Vicie Mutters, PA-C   Encounter Date: 03/10/2018  PT End of Session - 03/10/18 1019    Visit Number  6    Number of Visits  13    Date for PT Re-Evaluation  03/19/18    Authorization Type  MCR: KX mod by 15th visit, and progress not by 10th visit    PT Start Time  1018    PT Stop Time  1058    PT Time Calculation (min)  40 min    Activity Tolerance  Patient tolerated treatment well    Behavior During Therapy  [facility] for tasks assessed/performed       Past Medical History:  Diagnosis Date  . Anxiety   . Arthritis   . Cancer ([place])   . Depression   . GERD (gastroesophageal reflux disease)   . Glaucoma   . History of squamous cell carcinoma   . TMJ (temporomandibular joint syndrome)    wears gaurd at night    Past Surgical History:  Procedure Laterality Date  . ABDOMINAL HYSTERECTOMY     age 63  . ANTERIOR CRUCIATE LIGAMENT REPAIR Bilateral 1998  . AUGMENTATION MAMMAPLASTY Bilateral    saline  . BREAST SURGERY     implants  . CERVICAL FUSION  2012   Dr. Ellene Route  . SQUAMOUS CELL CARCINOMA EXCISION     Multiple    There were no vitals filed for this visit.  Subjective Assessment - 03/10/18 1018    Subjective  "I've been feeling good, I worked out the other day and i notice I am doing much better"     Currently in Pain?  No/denies                       [facility] Adult PT Treatment/Exercise - 03/10/18 1022      Lumbar Exercises: Aerobic   Elliptical  L2 x 5 min.      Lumbar Exercises: Supine   Bent Knee Raise  10 reps tactile cues for sustained posterior tilt, alternating L/R     Bridge  10 reps with heels on physioball x 2 sets    Bridge Limitations  1 set with hands  on table on set with hands crossed chest      Knee/Hip Exercises: Supine   Straight Leg Raises  2 sets;Right;10 reps with sustained quad set      Manual Therapy   Muscle Energy Technique  R isometric hip flexor MET 5 x 10 sec hold          Balance Exercises - 03/10/18 1057      Balance Exercises: Standing   Standing Eyes Opened  2 reps;30 secs;Narrow base of support (BOS);Solid surface    Standing Eyes Closed  Narrow base of support (BOS);Solid surface;2 reps;30 secs    Tandem Stance  Eyes open;Eyes closed;30 secs;4 reps    Other Standing Exercises  1/2 kneeling on airex pad in tandem 2 x 30 bil           PT Short Term Goals - 02/27/18 1100      PT SHORT TERM GOAL #1   Title  pt to be I with inital HEP     Time  3    Period  Weeks  Status  Achieved      PT SHORT TERM GOAL #2   Title  pt to verbalize/ demo proper posture and lifting mechanics to preven and reduce back pain     Time  3    Period  Weeks    Status  Achieved        PT Long Term Goals - 02/05/18 1243      PT LONG TERM GOAL #1   Title  pt to demonstrate reduced thoracic paraspinal spasm to promote  trunk mobility reporting </= 1/10 pain     Time  6    Period  Weeks    Status  New    Target Date  03/19/18      PT LONG TERM GOAL #2   Title  pt to improve core strength in the core and promote R thoracolumbar strengthening to promote posture correction and reduce L paraspinal spasm    Time  6    Period  Weeks    Status  New    Target Date  03/19/18      PT LONG TERM GOAL #3   Title  increase FOTO Score to </= 30% limited to demo improvement in function    Time  6    Period  Weeks    Status  New    Target Date  03/19/18      PT LONG TERM GOAL #4   Title  pt to be I with all HEP and is able to maintain and progress current level of function     Time  6    Period  Weeks    Status  New    Target Date  03/19/18            Plan - 03/10/18 1058    Clinical Impression Statement  no pain  noted today with only minimal soreness noted in the R low back/ SIJ. continued working on innominate rotation which she reported relief of pain. continued strengthening of core/ hip which she performed well. progress balance which she diwell with until tandem demonstrating significant postural sway. end of session she reported no pain.     PT Next Visit Plan   update HEP,  R core activation, thoracic mobs,  physioball exercise, supine R UE reaching with controlled breahting out, ankle/ hip strategies. balance, getting up from the floor.     PT Home Exercise Plan  seated thoracic rotation, seated pelvic tilt, childs pose reaching to the R, MTPR    Consulted and Agree with Plan of Care  Patient       Patient will benefit from skilled therapeutic intervention in order to improve the following deficits and impairments:     Visit Diagnosis: Pain in thoracic spine  Abnormal posture  Other muscle spasm     Problem List Patient Active Problem List   Diagnosis Date Noted  . COPD (chronic obstructive pulmonary disease) ([place]) 08/08/2017  . Gastroesophageal reflux disease without esophagitis 01/10/2016  . Thyroid activity decreased 01/10/2016  . Osteopenia 01/10/2016  . Encounter for Medicare annual wellness exam 07/25/2015  . Lower back pain 01/09/2015  . Insomnia 01/09/2015  . HSV-1 (herpes simplex virus 1) infection 01/09/2015  . Depression, major, in remission ([place])   . Anxiety   . Glaucoma   . Arthritis   . History of squamous cell carcinoma    Starr Lake PT, DPT, LAT, ATC  03/10/18  11:00 AM      [facility]  [place], [place], [address] Phone: [phone]   Fax:  [phone]  Name: Breanna Sparks MRN: [id number] Date of Birth: Sep 09, 1951

## 2018-03-12 ENCOUNTER — Ambulatory Visit: Payer: PPO | Admitting: Physical Therapy

## 2018-03-12 ENCOUNTER — Encounter: Payer: Self-pay | Admitting: Physical Therapy

## 2018-03-12 DIAGNOSIS — M546 Pain in thoracic spine: Secondary | ICD-10-CM

## 2018-03-12 DIAGNOSIS — M62838 Other muscle spasm: Secondary | ICD-10-CM

## 2018-03-12 DIAGNOSIS — R293 Abnormal posture: Secondary | ICD-10-CM

## 2018-03-12 NOTE — Therapy (Signed)
[place], [place], [address] Phone: [phone]   Fax:  [phone]  Physical Therapy Treatment  Patient Details  Name: Breanna Sparks MRN: [id number] Date of Birth: 1951-09-11 Referring Provider: Vicie Mutters, PA-C   Encounter Date: 03/12/2018  PT End of Session - 03/12/18 1017    Visit Number  7    Number of Visits  13    Date for PT Re-Evaluation  03/19/18    PT Start Time  1017    PT Stop Time  1101    PT Time Calculation (min)  44 min    Activity Tolerance  Patient tolerated treatment well    Behavior During Therapy  [facility] for tasks assessed/performed       Past Medical History:  Diagnosis Date  . Anxiety   . Arthritis   . Cancer ([place])   . Depression   . GERD (gastroesophageal reflux disease)   . Glaucoma   . History of squamous cell carcinoma   . TMJ (temporomandibular joint syndrome)    wears gaurd at night    Past Surgical History:  Procedure Laterality Date  . ABDOMINAL HYSTERECTOMY     age 35  . ANTERIOR CRUCIATE LIGAMENT REPAIR Bilateral 1998  . AUGMENTATION MAMMAPLASTY Bilateral    saline  . BREAST SURGERY     implants  . CERVICAL FUSION  2012   Dr. Ellene Route  . SQUAMOUS CELL CARCINOMA EXCISION     Multiple    There were no vitals filed for this visit.  Subjective Assessment - 03/12/18 1019    Subjective  "I am sore in my bottom from doing leg day but otherwise I am doing pretty good"     Currently in Pain?  No/denies    Aggravating Factors   N/A                       [facility] Adult PT Treatment/Exercise - 03/12/18 1021      Lumbar Exercises: Aerobic   Elliptical  L2 x 6 min.      Lumbar Exercises: Standing   Other Standing Lumbar Exercises  machine push/ pull 2 x 10 with 5# contralteral UE push/pull    Other Standing Lumbar Exercises  lift/ chop pulling only to the L to promote R trunk/core strength 2 x 10 with 3#      Knee/Hip Exercises:  Stretches   Gastroc Stretch  2 reps;30 seconds slant board    Soleus Stretch  2 reps;30 seconds slant board      Knee/Hip Exercises: Standing   Lateral Step Up  2 sets;10 reps onto bosu    Forward Step Up  2 sets;10 reps onto bosu          Balance Exercises - 03/12/18 1100      Balance Exercises: Standing   Standing Eyes Opened  2 reps;30 secs;Narrow base of support (BOS);Solid surface in corner for HEP    Standing Eyes Closed  Narrow base of support (BOS);Solid surface;2 reps;30 secs in corner for HEP    Tandem Stance  Eyes open;Eyes closed;30 secs;4 reps in corner for HEP        PT Education - 03/12/18 1052    Education provided  Yes    Education Details  updated HEP for balance    Person(s) Educated  Patient    Methods  Explanation;Verbal cues;Handout    Comprehension  Verbalized understanding;Verbal cues required  PT Short Term Goals - 02/27/18 1100      PT SHORT TERM GOAL #1   Title  pt to be I with inital HEP     Time  3    Period  Weeks    Status  Achieved      PT SHORT TERM GOAL #2   Title  pt to verbalize/ demo proper posture and lifting mechanics to preven and reduce back pain     Time  3    Period  Weeks    Status  Achieved        PT Long Term Goals - 02/05/18 1243      PT LONG TERM GOAL #1   Title  pt to demonstrate reduced thoracic paraspinal spasm to promote  trunk mobility reporting </= 1/10 pain     Time  6    Period  Weeks    Status  New    Target Date  03/19/18      PT LONG TERM GOAL #2   Title  pt to improve core strength in the core and promote R thoracolumbar strengthening to promote posture correction and reduce L paraspinal spasm    Time  6    Period  Weeks    Status  New    Target Date  03/19/18      PT LONG TERM GOAL #3   Title  increase FOTO Score to </= 30% limited to demo improvement in function    Time  6    Period  Weeks    Status  New    Target Date  03/19/18      PT LONG TERM GOAL #4   Title  pt to be I with  all HEP and is able to maintain and progress current level of function     Time  6    Period  Weeks    Status  New    Target Date  03/19/18            Plan - 03/12/18 1102    Clinical Impression Statement  report of soreness from exercise but no pain. continued working core strength utilizing bil UE, and contninued working balance training which she continues to demonstrate signficant postural sway with narrow BOS. end of session she reported no increase in pain. discussed with pt due to progress that she is nearing D/C and may discharge in the next few visits.     PT Next Visit Plan   update HEP,  R core activation, thoracic mobs,  physioball exercise, supine R UE reaching with controlled breahting out, ankle/ hip strategies. balance, getting up from the floor.     PT Home Exercise Plan  seated thoracic rotation, seated pelvic tilt, childs pose reaching to the R, MTPR, standing balance in corner    Consulted and Agree with Plan of Care  Patient       Patient will benefit from skilled therapeutic intervention in order to improve the following deficits and impairments:     Visit Diagnosis: Pain in thoracic spine  Abnormal posture  Other muscle spasm     Problem List Patient Active Problem List   Diagnosis Date Noted  . COPD (chronic obstructive pulmonary disease) ([place]) 08/08/2017  . Gastroesophageal reflux disease without esophagitis 01/10/2016  . Thyroid activity decreased 01/10/2016  . Osteopenia 01/10/2016  . Encounter for Medicare annual wellness exam 07/25/2015  . Lower back pain 01/09/2015  . Insomnia 01/09/2015  . HSV-1 (herpes simplex virus 1)  infection 01/09/2015  . Depression, major, in remission ([place])   . Anxiety   . Glaucoma   . Arthritis   . History of squamous cell carcinoma    Starr Lake PT, DPT, LAT, ATC  03/12/18  11:04 AM      [facility] [address] [place], [place],  [address] Phone: [phone]   Fax:  [phone]  Name: Breanna Sparks MRN: [id number] Date of Birth: 1951-08-17

## 2018-03-17 ENCOUNTER — Ambulatory Visit: Payer: PPO | Admitting: Physical Therapy

## 2018-03-17 ENCOUNTER — Encounter: Payer: Self-pay | Admitting: Physical Therapy

## 2018-03-17 DIAGNOSIS — M546 Pain in thoracic spine: Secondary | ICD-10-CM

## 2018-03-17 DIAGNOSIS — M62838 Other muscle spasm: Secondary | ICD-10-CM

## 2018-03-17 DIAGNOSIS — R293 Abnormal posture: Secondary | ICD-10-CM

## 2018-03-17 NOTE — Therapy (Signed)
[place], [place], [address] Phone: [phone]   Fax:  [phone]  Physical Therapy Treatment  Patient Details  Name: Breanna Sparks MRN: [id number] Date of Birth: 06-Oct-1951 Referring Provider: Vicie Mutters, PA-C   Encounter Date: 03/17/2018  PT End of Session - 03/17/18 1018    Visit Number  8    Number of Visits  13    Date for PT Re-Evaluation  03/19/18    Authorization Type  MCR: KX mod by 15th visit, and progress not by 10th visit    PT Start Time  1018    PT Stop Time  1059    PT Time Calculation (min)  41 min    Activity Tolerance  Patient tolerated treatment well    Behavior During Therapy  [facility] for tasks assessed/performed       Past Medical History:  Diagnosis Date  . Anxiety   . Arthritis   . Cancer ([place])   . Depression   . GERD (gastroesophageal reflux disease)   . Glaucoma   . History of squamous cell carcinoma   . TMJ (temporomandibular joint syndrome)    wears gaurd at night    Past Surgical History:  Procedure Laterality Date  . ABDOMINAL HYSTERECTOMY     age 40  . ANTERIOR CRUCIATE LIGAMENT REPAIR Bilateral 1998  . AUGMENTATION MAMMAPLASTY Bilateral    saline  . BREAST SURGERY     implants  . CERVICAL FUSION  2012   Dr. Ellene Route  . SQUAMOUS CELL CARCINOMA EXCISION     Multiple    There were no vitals filed for this visit.  Subjective Assessment - 03/17/18 1017    Subjective  "things are going good, some soreness from working out yesterday and doing yard work"     Currently in Pain?  No/denies                       [facility] Adult PT Treatment/Exercise - 03/17/18 1053      Therapeutic Activites    Therapeutic Activities  Other Therapeutic Activities    Other Therapeutic Activities  getting up/ down from the floor 1 x 10 lunge position with hands on lead leg. switching lead leg at 10 reps      Lumbar Exercises: Seated   Other Seated  Lumbar Exercises  seated pelvic tilt on physioball 2 x 10 (tactile cues for proper form. seated marching with sutainted pelvic tilt 2 x 10 )      Lumbar Exercises: Supine   Dead Bug  10 reps alternating lifting L/R  x 2 sets    Dead Bug Limitations  1 x 10 adding contralatea UE movement keeping core tight      Lumbar Exercises: Prone   Single Arm Raise  10 reps maintain back in good nuetral    Opposite Arm/Leg Raise  Right arm/Left leg;Left arm/Right leg;10 reps keeping back in neutral position          Balance Exercises - 03/17/18 1101      Balance Exercises: Standing   Standing Eyes Opened  2 reps;30 secs    Standing Eyes Closed  2 reps;30 secs    Tandem Stance  Eyes closed;4 reps;30 secs          PT Short Term Goals - 02/27/18 1100      PT SHORT TERM GOAL #1   Title  pt to be I with inital HEP  Time  3    Period  Weeks    Status  Achieved      PT SHORT TERM GOAL #2   Title  pt to verbalize/ demo proper posture and lifting mechanics to preven and reduce back pain     Time  3    Period  Weeks    Status  Achieved        PT Long Term Goals - 02/05/18 1243      PT LONG TERM GOAL #1   Title  pt to demonstrate reduced thoracic paraspinal spasm to promote  trunk mobility reporting </= 1/10 pain     Time  6    Period  Weeks    Status  New    Target Date  03/19/18      PT LONG TERM GOAL #2   Title  pt to improve core strength in the core and promote R thoracolumbar strengthening to promote posture correction and reduce L paraspinal spasm    Time  6    Period  Weeks    Status  New    Target Date  03/19/18      PT LONG TERM GOAL #3   Title  increase FOTO Score to </= 30% limited to demo improvement in function    Time  6    Period  Weeks    Status  New    Target Date  03/19/18      PT LONG TERM GOAL #4   Title  pt to be I with all HEP and is able to maintain and progress current level of function     Time  6    Period  Weeks    Status  New    Target  Date  03/19/18            Plan - 03/17/18 1104    Clinical Impression Statement  pt reported minimal soreness from exercises in the low back. Focused session on core strengthening and functional mobility getting down and up from the floor, pt performed activity well with minimal cues for foot position for balance. end of session reported no pain in the low back. plan to reassess next session and possibly discharge.     PT Next Visit Plan   update HEP,  R core activation, thoracic mobs,  physioball exercise, supine R UE reaching with controlled breahting out, ankle/ hip strategies. balance, getting up from the floor.     PT Home Exercise Plan  seated thoracic rotation, seated pelvic tilt, childs pose reaching to the R, MTPR, standing balance in corner    Consulted and Agree with Plan of Care  Patient       Patient will benefit from skilled therapeutic intervention in order to improve the following deficits and impairments:  Pain, Increased muscle spasms, Postural dysfunction, Improper body mechanics, Decreased activity tolerance, Decreased endurance, Decreased mobility  Visit Diagnosis: Pain in thoracic spine  Abnormal posture  Other muscle spasm     Problem List Patient Active Problem List   Diagnosis Date Noted  . COPD (chronic obstructive pulmonary disease) ([place]) 08/08/2017  . Gastroesophageal reflux disease without esophagitis 01/10/2016  . Thyroid activity decreased 01/10/2016  . Osteopenia 01/10/2016  . Encounter for Medicare annual wellness exam 07/25/2015  . Lower back pain 01/09/2015  . Insomnia 01/09/2015  . HSV-1 (herpes simplex virus 1) infection 01/09/2015  . Depression, major, in remission ([place])   . Anxiety   . Glaucoma   . Arthritis   .  History of squamous cell carcinoma    Starr Lake PT, DPT, LAT, ATC  03/17/18  11:08 AM      [facility] [address] [place], [place], [address] Phone:  [phone]   Fax:  [phone]  Name: Breanna Sparks MRN: [id number] Date of Birth: 11/05/1950

## 2018-03-19 ENCOUNTER — Ambulatory Visit: Payer: PPO | Admitting: Physical Therapy

## 2018-03-19 ENCOUNTER — Encounter: Payer: Self-pay | Admitting: Physical Therapy

## 2018-03-19 DIAGNOSIS — R293 Abnormal posture: Secondary | ICD-10-CM

## 2018-03-19 DIAGNOSIS — M546 Pain in thoracic spine: Secondary | ICD-10-CM

## 2018-03-19 DIAGNOSIS — M62838 Other muscle spasm: Secondary | ICD-10-CM

## 2018-03-19 NOTE — Therapy (Signed)
[place], [place], [address] Phone: [phone]   Fax:  [phone]  Physical Therapy Treatment / Discharge Summary  Patient Details  Name: Breanna Sparks MRN: [id number] Date of Birth: 1951-01-09 Referring Provider: Vicie Mutters, PA-C   Encounter Date: 03/19/2018  PT End of Session - 03/19/18 1022    Visit Number  9    Number of Visits  13    Date for PT Re-Evaluation  03/19/18    PT Start Time  1017    PT Stop Time  1055    PT Time Calculation (min)  38 min    Activity Tolerance  Patient tolerated treatment well    Behavior During Therapy  [facility] for tasks assessed/performed       Past Medical History:  Diagnosis Date  . Anxiety   . Arthritis   . Cancer ([place])   . Depression   . GERD (gastroesophageal reflux disease)   . Glaucoma   . History of squamous cell carcinoma   . TMJ (temporomandibular joint syndrome)    wears gaurd at night    Past Surgical History:  Procedure Laterality Date  . ABDOMINAL HYSTERECTOMY     age 13  . ANTERIOR CRUCIATE LIGAMENT REPAIR Bilateral 1998  . AUGMENTATION MAMMAPLASTY Bilateral    saline  . BREAST SURGERY     implants  . CERVICAL FUSION  2012   Dr. Ellene Sparks  . SQUAMOUS CELL CARCINOMA EXCISION     Multiple    There were no vitals filed for this visit.  Subjective Assessment - 03/19/18 1021    Subjective  "I had a hiccup the other day with some soreness but I did my exercise and stretching and it made it go away.     Currently in Pain?  No/denies    Aggravating Factors   N/A         [facility] PT Assessment - 03/19/18 1025      Observation/Other Assessments   Focus on Therapeutic Outcomes (FOTO)   17% limited      AROM   Lumbar Flexion  120    Lumbar Extension  40    Lumbar - Right Side Bend  20    Lumbar - Left Side Bend  20    Thoracic Flexion  25    Thoracic Extension  30    Thoracic - Right Side Bend  20    Thoracic - Left Side  Bend  30                   [facility] Adult PT Treatment/Exercise - 03/19/18 1031      Lumbar Exercises: Aerobic   Elliptical  L2 x 6 min.      Lumbar Exercises: Supine   Dead Bug  1 second;20 reps x 2 with UE raching cross body for oblique activation    Bridge  --      Bridge with March  10 reps;Compliant x 2 , keeping back flat          Balance Exercises - 03/19/18 1042      Balance Exercises: Standing   Tandem Gait  Forward;Retro;4 reps        PT Education - 03/19/18 1054    Education provided  Yes    Education Details  reviewed previously provided HEP, benefits of continued exercise and core strengthening how to progress as well as importance of rest breaks/ days. how to challenge/ progress balance  Person(s) Educated  Patient    Methods  Explanation;Verbal cues;Handout;Demonstration    Comprehension  Verbalized understanding;Verbal cues required;Returned demonstration       PT Short Term Goals - 02/27/18 1100      PT SHORT TERM GOAL #1   Title  pt to be I with inital HEP     Time  3    Period  Weeks    Status  Achieved      PT SHORT TERM GOAL #2   Title  pt to verbalize/ demo proper posture and lifting mechanics to preven and reduce back pain     Time  3    Period  Weeks    Status  Achieved        PT Long Term Goals - 03/19/18 1030      PT LONG TERM GOAL #1   Title  pt to demonstrate reduced thoracic paraspinal spasm to promote  trunk mobility reporting </= 1/10 pain     Time  6    Period  Weeks    Status  Achieved      PT LONG TERM GOAL #2   Title  pt to improve core strength in the core and promote R thoracolumbar strengthening to promote posture correction and reduce L paraspinal spasm    Time  6    Period  Weeks    Status  Achieved      PT LONG TERM GOAL #3   Title  increase FOTO Score to </= 30% limited to demo improvement in function    Time  6    Period  Weeks    Status  Achieved      PT LONG TERM GOAL #4   Title  pt to be  I with all HEP and is able to maintain and progress current level of function     Time  6    Period  Weeks    Status  Achieved            Plan - 03/19/18 1055    Clinical Impression Statement  Mrs. Breanna Sparks reports no pain today and has made great progress with physical therapy increasing trunk mobility with no paraspinals spasm. she met all goals and additionally reports no pain. she is able to maintain and progress her current level of function independently and will be discharged from PT today.     PT Next Visit Plan  D/C    PT Home Exercise Plan  seated thoracic rotation, seated pelvic tilt, childs pose reaching to the R, MTPR, standing balance in corner, bridge with march    Consulted and Agree with Plan of Care  Patient       Patient will benefit from skilled therapeutic intervention in order to improve the following deficits and impairments:  Pain, Increased muscle spasms, Postural dysfunction, Improper body mechanics, Decreased activity tolerance, Decreased endurance, Decreased mobility  Visit Diagnosis: Pain in thoracic spine  Abnormal posture  Other muscle spasm     Problem List Patient Active Problem List   Diagnosis Date Noted  . COPD (chronic obstructive pulmonary disease) ([place]) 08/08/2017  . Gastroesophageal reflux disease without esophagitis 01/10/2016  . Thyroid activity decreased 01/10/2016  . Osteopenia 01/10/2016  . Encounter for Medicare annual wellness exam 07/25/2015  . Lower back pain 01/09/2015  . Insomnia 01/09/2015  . HSV-1 (herpes simplex virus 1) infection 01/09/2015  . Depression, major, in remission ([place])   . Anxiety   . Glaucoma   . Arthritis   .  History of squamous cell carcinoma     Breanna Sparks 03/19/2018, 10:57 AM  [facility] [address] [place], [place], [address] Phone: [phone]   Fax:  [phone]  Name: Breanna Sparks MRN: [id number] Date of Birth:  09/11/1951      PHYSICAL THERAPY DISCHARGE SUMMARY  Visits from Start of Care: 9  Current functional level related to goals / functional outcomes: See goals, FOTO 17% limited   Remaining deficits: Intermittent low back soreness and stiffness controlled with stretching and exercise.    Education / Equipment: HEP, theraband, posture, anatomy of back, balance  Plan: Patient agrees to discharge.  Patient goals were met. Patient is being discharged due to meeting the stated rehab goals.  ?????         Kristoffer Leamon PT, DPT, LAT, ATC  03/19/18  10:58 AM

## 2018-03-26 ENCOUNTER — Encounter: Payer: Self-pay | Admitting: Gastroenterology

## 2018-04-01 ENCOUNTER — Other Ambulatory Visit: Payer: Self-pay | Admitting: Internal Medicine

## 2018-04-01 DIAGNOSIS — M1990 Unspecified osteoarthritis, unspecified site: Secondary | ICD-10-CM

## 2018-04-01 MED ORDER — CYCLOBENZAPRINE HCL 10 MG PO TABS: ORAL_TABLET | ORAL | 0 refills | Status: DC

## 2018-04-07 ENCOUNTER — Encounter: Payer: Self-pay | Admitting: Gastroenterology

## 2018-04-15 ENCOUNTER — Other Ambulatory Visit: Payer: Self-pay | Admitting: Internal Medicine

## 2018-04-15 DIAGNOSIS — G4700 Insomnia, unspecified: Secondary | ICD-10-CM

## 2018-04-15 DIAGNOSIS — F325 Major depressive disorder, single episode, in full remission: Secondary | ICD-10-CM

## 2018-04-15 MED ORDER — LORAZEPAM 2 MG PO TABS: ORAL_TABLET | ORAL | 0 refills | Status: DC

## 2018-05-26 DIAGNOSIS — Z419 Encounter for procedure for purposes other than remedying health state, unspecified: Secondary | ICD-10-CM | POA: Diagnosis not present

## 2018-05-26 DIAGNOSIS — Z85828 Personal history of other malignant neoplasm of skin: Secondary | ICD-10-CM | POA: Diagnosis not present

## 2018-05-26 DIAGNOSIS — C44519 Basal cell carcinoma of skin of other part of trunk: Secondary | ICD-10-CM | POA: Diagnosis not present

## 2018-05-26 DIAGNOSIS — D485 Neoplasm of uncertain behavior of skin: Secondary | ICD-10-CM | POA: Diagnosis not present

## 2018-05-26 DIAGNOSIS — D225 Melanocytic nevi of trunk: Secondary | ICD-10-CM | POA: Diagnosis not present

## 2018-05-26 DIAGNOSIS — Z85820 Personal history of malignant melanoma of skin: Secondary | ICD-10-CM | POA: Diagnosis not present

## 2018-05-26 DIAGNOSIS — D2272 Melanocytic nevi of left lower limb, including hip: Secondary | ICD-10-CM | POA: Diagnosis not present

## 2018-05-26 DIAGNOSIS — L430 Hypertrophic lichen planus: Secondary | ICD-10-CM | POA: Diagnosis not present

## 2018-05-26 DIAGNOSIS — L821 Other seborrheic keratosis: Secondary | ICD-10-CM | POA: Diagnosis not present

## 2018-05-26 DIAGNOSIS — C44619 Basal cell carcinoma of skin of left upper limb, including shoulder: Secondary | ICD-10-CM | POA: Diagnosis not present

## 2018-05-27 ENCOUNTER — Encounter: Payer: Self-pay | Admitting: Gastroenterology

## 2018-05-27 ENCOUNTER — Ambulatory Visit (AMBULATORY_SURGERY_CENTER): Payer: Self-pay | Admitting: *Deleted

## 2018-05-27 VITALS — Ht 66.0 in | Wt 105.4 lb

## 2018-05-27 DIAGNOSIS — Z1211 Encounter for screening for malignant neoplasm of colon: Secondary | ICD-10-CM

## 2018-05-27 NOTE — Progress Notes (Signed)
Patient denies any allergies to eggs or soy. Patient denies any problems with anesthesia/sedation. Patient denies any oxygen use at home. Patient denies taking any diet/weight loss medications or blood thinners. EMMI education assisgned to patient on colonoscopy, this was explained and instructions given to patient. 

## 2018-06-10 ENCOUNTER — Encounter: Payer: Self-pay | Admitting: Gastroenterology

## 2018-06-10 ENCOUNTER — Ambulatory Visit (AMBULATORY_SURGERY_CENTER): Payer: PPO | Admitting: Gastroenterology

## 2018-06-10 VITALS — BP 138/87 | HR 67 | Temp 97.1°F | Resp 29 | Ht 66.0 in | Wt 105.0 lb

## 2018-06-10 DIAGNOSIS — K635 Polyp of colon: Secondary | ICD-10-CM | POA: Diagnosis not present

## 2018-06-10 DIAGNOSIS — D129 Benign neoplasm of anus and anal canal: Secondary | ICD-10-CM

## 2018-06-10 DIAGNOSIS — K621 Rectal polyp: Secondary | ICD-10-CM | POA: Diagnosis not present

## 2018-06-10 DIAGNOSIS — Z1211 Encounter for screening for malignant neoplasm of colon: Secondary | ICD-10-CM | POA: Diagnosis not present

## 2018-06-10 DIAGNOSIS — D128 Benign neoplasm of rectum: Secondary | ICD-10-CM

## 2018-06-10 DIAGNOSIS — D120 Benign neoplasm of cecum: Secondary | ICD-10-CM

## 2018-06-10 MED ORDER — SODIUM CHLORIDE 0.9 % IV SOLN: 500.0000 mL | Freq: Once | INTRAVENOUS | Status: DC

## 2018-06-10 NOTE — Patient Instructions (Signed)
YOU HAD AN ENDOSCOPIC PROCEDURE TODAY AT THE [facility]:   Refer to the procedure report that was given to you for any specific questions about what was found during the examination.  If the procedure report does not answer your questions, please call your gastroenterologist to clarify.  If you requested that your care partner not be given the details of your procedure findings, then the procedure report has been included in a sealed envelope for you to review at your convenience later.  YOU SHOULD EXPECT: Some feelings of bloating in the abdomen. Passage of more gas than usual.  Walking can help get rid of the air that was put into your GI tract during the procedure and reduce the bloating. If you had a lower endoscopy (such as a colonoscopy or flexible sigmoidoscopy) you may notice spotting of blood in your stool or on the toilet paper. If you underwent a bowel prep for your procedure, you may not have a normal bowel movement for a few days.  Please Note:  You might notice some irritation and congestion in your nose or some drainage.  This is from the oxygen used during your procedure.  There is no need for concern and it should clear up in a day or so.  SYMPTOMS TO REPORT IMMEDIATELY:   Following lower endoscopy (colonoscopy or flexible sigmoidoscopy):  Excessive amounts of blood in the stool  Significant tenderness or worsening of abdominal pains  Swelling of the abdomen that is new, acute  Fever of 100F or higher  For urgent or emergent issues, a gastroenterologist can be reached at any hour by calling [phone].   DIET:  We do recommend a small meal at first, but then you may proceed to your regular diet.  Drink plenty of fluids but you should avoid alcoholic beverages for 24 hours.  ACTIVITY:  You should plan to take it easy for the rest of today and you should NOT DRIVE or use heavy machinery until tomorrow (because of the sedation medicines used during the test).     FOLLOW UP: Our staff will call the number listed on your records the next business day following your procedure to check on you and address any questions or concerns that you may have regarding the information given to you following your procedure. If we do not reach you, we will leave a message.  However, if you are feeling well and you are not experiencing any problems, there is no need to return our call.  We will assume that you have returned to your regular daily activities without incident.  If any biopsies were taken you will be contacted by phone or by letter within the next 1-3 weeks.  Please call us at [phone] if you have not heard about the biopsies in 3 weeks.   Await for biopsy results Polyps (handout given) Diverticulosis (handout given) Hemorrhoids (handout given)   SIGNATURES/CONFIDENTIALITY: You and/or your care partner have signed paperwork which will be entered into your electronic medical record.  These signatures attest to the fact that that the information above on your After Visit Summary has been reviewed and is understood.  Full responsibility of the confidentiality of this discharge information lies with you and/or your care-partner. 

## 2018-06-10 NOTE — Progress Notes (Signed)
A and O x3. Report to RN. Tolerated MAC anesthesia well.

## 2018-06-10 NOTE — Progress Notes (Signed)
Pt's states no medical or surgical changes since previsit or office visit. 

## 2018-06-10 NOTE — Progress Notes (Signed)
Called to room to assist during endoscopic procedure.  Patient ID and intended procedure confirmed with present staff. Received instructions for my participation in the procedure from the performing physician.  

## 2018-06-10 NOTE — Op Note (Signed)
[facility] Patient Name: Breanna Sparks Procedure Date: 06/10/2018 8:37 AM MRN: [id number] Endoscopist: Mauri Pole , MD Age: 67 Referring MD:  Date of Birth: 30-Jul-1951 Gender: Female Account #: [id number] Procedure:                Colonoscopy Indications:              Screening for colorectal malignant neoplasm Medicines:                Monitored Anesthesia Care Procedure:                Pre-Anesthesia Assessment:                           - Prior to the procedure, a History and Physical                            was performed, and patient medications and                            allergies were reviewed. The patient's tolerance of                            previous anesthesia was also reviewed. The risks                            and benefits of the procedure and the sedation                            options and risks were discussed with the patient.                            All questions were answered, and informed consent                            was obtained. Prior Anticoagulants: The patient has                            taken no previous anticoagulant or antiplatelet                            agents. ASA Grade Assessment: II - A patient with                            mild systemic disease. After reviewing the risks                            and benefits, the patient was deemed in                            satisfactory condition to undergo the procedure.                           After obtaining informed consent, the colonoscope  was passed under direct vision. Throughout the                            procedure, the patient's blood pressure, pulse, and                            oxygen saturations were monitored continuously. The                            Colonoscope was introduced through the anus and                            advanced to the the cecum, identified by                            appendiceal  orifice and ileocecal valve. The                            colonoscopy was performed without difficulty. The                            patient tolerated the procedure well. The quality                            of the bowel preparation was excellent. The                            ileocecal valve, appendiceal orifice, and rectum                            were photographed. Scope In: 8:40:59 AM Scope Out: 8:58:11 AM Scope Withdrawal Time: 0 hours 11 minutes 3 seconds  Total Procedure Duration: 0 hours 17 minutes 12 seconds  Findings:                 The perianal and digital rectal examinations were                            normal.                           A 5 mm polyp was found in the cecum. The polyp was                            sessile. The polyp was removed with a cold snare.                            Resection and retrieval were complete.                           A 2 mm polyp was found in the rectum. The polyp was                            sessile. The polyp was removed with a cold biopsy  forceps. Resection and retrieval were complete.                           A scattered area of mild melanosis was found in the                            entire colon.                           Scattered small-mouthed diverticula were found in                            the sigmoid colon and descending colon.                           Non-bleeding internal hemorrhoids were found during                            retroflexion. The hemorrhoids were small. Complications:            No immediate complications. Estimated Blood Loss:     Estimated blood loss was minimal. Impression:               - One 5 mm polyp in the cecum, removed with a cold                            snare. Resected and retrieved.                           - One 2 mm polyp in the rectum, removed with a cold                            biopsy forceps. Resected and retrieved.                            - Melanosis in the colon.                           - Diverticulosis in the sigmoid colon and in the                            descending colon.                           - Non-bleeding internal hemorrhoids. Recommendation:           - Patient has a contact number available for                            emergencies. The signs and symptoms of potential                            delayed complications were discussed with the                            patient. Return to normal activities tomorrow.  Written discharge instructions were provided to the                            patient.                           - Resume previous diet.                           - Continue present medications.                           - Await pathology results.                           - Repeat colonoscopy in 5-10 years for surveillance                            based on pathology results. Mauri Pole, MD 06/10/2018 9:01:59 AM This report has been signed electronically.

## 2018-06-11 ENCOUNTER — Telehealth: Payer: Self-pay | Admitting: *Deleted

## 2018-06-11 NOTE — Telephone Encounter (Signed)
  Follow up Call-  Call back number 06/10/2018  Post procedure Call Back phone  # [phone]  Permission to leave phone message Yes  Some recent data might be hidden     Patient questions:  Do you have a fever, pain , or abdominal swelling? No. Pain Score  0 *  Have you tolerated food without any problems? Yes.    Have you been able to return to your normal activities? Yes.    Do you have any questions about your discharge instructions: Diet   No. Medications  No. Follow up visit  No.  Do you have questions or concerns about your Care? No.  Actions: * If pain score is 4 or above: No action needed, pain <4.

## 2018-06-22 ENCOUNTER — Encounter: Payer: Self-pay | Admitting: Gastroenterology

## 2018-07-02 ENCOUNTER — Ambulatory Visit (INDEPENDENT_AMBULATORY_CARE_PROVIDER_SITE_OTHER): Payer: PPO

## 2018-07-02 DIAGNOSIS — Z23 Encounter for immunization: Secondary | ICD-10-CM | POA: Diagnosis not present

## 2018-07-14 ENCOUNTER — Encounter: Payer: Self-pay | Admitting: Physician Assistant

## 2018-07-14 DIAGNOSIS — G4700 Insomnia, unspecified: Secondary | ICD-10-CM

## 2018-07-14 DIAGNOSIS — M1990 Unspecified osteoarthritis, unspecified site: Secondary | ICD-10-CM

## 2018-07-14 DIAGNOSIS — F325 Major depressive disorder, single episode, in full remission: Secondary | ICD-10-CM

## 2018-07-14 MED ORDER — CYCLOBENZAPRINE HCL 10 MG PO TABS: ORAL_TABLET | ORAL | 0 refills | Status: DC

## 2018-07-14 MED ORDER — LORAZEPAM 2 MG PO TABS: ORAL_TABLET | ORAL | 0 refills | Status: DC

## 2018-07-14 NOTE — Progress Notes (Signed)
Refilled medications.  Test page for Evisit

## 2018-07-14 NOTE — Telephone Encounter (Signed)
This encounter was created in error - please disregard.  This encounter was created in error - please disregard.

## 2018-07-23 NOTE — Progress Notes (Signed)
CPE and follow up  Assessment:  Depression, major, in remission ([place]) lexapro continue it  Anxiety Continue lexapro   Arthritis PRN  Bilateral low back pain without sciatica PRN   Insomnia Continue ativan  Glaucoma Continue follow up  History of squamous cell carcinoma Continue derm follow up q 3 months  HSV-1 (herpes simplex virus 1) infection Valtrex PRN, refill today  Gastroesophageal reflux disease without esophagitis Continue H2   Hypothyroidism, unspecified hypothyroidism type Hypothyroidism-check TSH level, continue medications the same, reminded to take on an empty stomach 30-56mins before food.  - TSH  Vitamin D deficiency - VITAMIN D 25 Hydroxy (Vit-D Deficiency, Fractures)   Medication management - CBC with Differential/Platelet - BASIC METABOLIC PANEL WITH GFR - Hepatic function panel - Magnesium  Screening cholesterol level - Lipid panel   Routine general medical examination at a health care facility   Osteopenia Continue vitamin d, weight bearing exercises, due 2019    Discussed med's effects and SE's. Screening labs and tests as requested with regular follow-up as recommended. No future appointments.   Subjective:    HPI 67 y.o. female  presents for a CPE and follow up for chol, depression, SCC, vitamin D def.   Her blood pressure has been controlled at home, today their BP is BP: 110/68 She denies chest pain, shortness of breath, dizziness.  Her cholesterol is diet controlled.  Her cholesterol is controlled. The cholesterol last visit was:   Lab Results  Component Value Date   CHOL 230 (H) 01/21/2018   HDL 88 01/21/2018   LDLCALC 120 (H) 01/21/2018   TRIG 114 01/21/2018   CHOLHDL 2.6 01/21/2018   Patient is on Vitamin D supplement.  Lab Results  Component Value Date   VD25OH 114 (H) 01/21/2018   She has had a normal A1C.  Lab Results  Component Value Date   HGBA1C 5.1 11/16/2013   History of SCC and melanoma last OV,  follows with DERM q 3 months.  She has glacoma and follows with her eye doctor every 6 months.  Patient is on lexapro 20 and states she is doing well with it.  Takes ativan at night for sleep.   She has history of elevated AST/ALT, negative hepatitis 05/2014.  Lab Results  Component Value Date   ALT 24 01/21/2018   AST 30 01/21/2018   ALKPHOS 48 01/21/2017   BILITOT 0.3 01/21/2018   She is on thyroid medication. Her medication was not changed last visit.   Lab Results  Component Value Date   TSH 1.94 01/21/2018  .  BMI is Body mass index is 17.01 kg/m., she is working on diet and exercise. She is eating more veggies, has started training. She is working on her balance as well and doing well.  Wt Readings from Last 3 Encounters:  07/28/18 105 lb 6.4 oz (47.8 kg)  06/10/18 105 lb (47.6 kg)  05/27/18 105 lb 6.4 oz (47.8 kg)     Current Medications:  Current Outpatient Medications on File Prior to Visit  Medication Sig Dispense Refill  . Cholecalciferol (VITAMIN D PO) Take 1,000 Int'l Units by mouth daily.    . Cyanocobalamin (VITAMIN B-12 PO) Take by mouth daily.    . dorzolamide-timolol (COSOPT) 22.3-6.8 MG/ML ophthalmic solution INSTILL 1 DROP INTO BOTH EYES EVERY 12 HOURS  3  . escitalopram (LEXAPRO) 20 MG tablet Take 1 tablet (20 mg total) by mouth daily. 90 tablet 3  . ibuprofen (ADVIL,MOTRIN) 200 MG tablet Take 200 mg by  mouth every 6 (six) hours as needed.    . latanoprost (XALATAN) 0.005 % ophthalmic solution     . levothyroxine (SYNTHROID, LEVOTHROID) 50 MCG tablet TAKE ONE TABLET BY MOUTH ONCE DAILY BEFORE BREAKFAST 90 tablet 3  . valACYclovir (VALTREX) 1000 MG tablet Take 1 tablet (1,000 mg total) by mouth 2 (two) times daily. 180 tablet 0   Current Facility-Administered Medications on File Prior to Visit  Medication Dose Route Frequency Provider Last Rate Last Dose  . 0.9 %  sodium chloride infusion  500 mL Intravenous Once Nandigam, Venia Minks, MD       Immunization  History  Administered Date(s) Administered  . Influenza Split 07/02/2013, 07/06/2014  . Influenza, High Dose Seasonal PF 06/26/2015, 07/11/2016, 07/31/2017, 07/02/2018  . Pneumococcal Conjugate-13 07/25/2015  . Pneumococcal Polysaccharide-23 02/03/2014  . Tdap 11/13/2012  . Zoster 11/16/2013   Health Maintenance:  Tetanus: 2014 Pneumovax: 2015 Prevnar 13: 2016 Flu vaccine: 2018 Zostavax: 2015  Pap: TAH not needed MGM: 01/2018 DEXA: 11/2015, osteopenia due 2019 will get with MGM Colonoscopy: 06/2018 Dr. Olevia Perches EGD: 03/2008   Dr. Nicki Reaper eye doctor Oct 2018 q 3-6 months for glaucoma Dr. Jacelyn Grip Dentist every 6 months Patient Care Team: Unk Pinto, MD as PCP - General (Internal Medicine) Lafayette Dragon, MD (Inactive) as Consulting Physician (Gastroenterology) Macarthur Critchley, [place] as Referring Physician (Optometry) Martinique, Amy, MD as Consulting Physician (Dermatology) Kristeen Miss, MD as Consulting Physician (Neurosurgery)  Medical History: Patient Active Problem List   Diagnosis Date Noted  . COPD (chronic obstructive pulmonary disease) ([place]) 08/08/2017  . Gastroesophageal reflux disease without esophagitis 01/10/2016  . Thyroid activity decreased 01/10/2016  . Osteopenia 01/10/2016  . Encounter for Medicare annual wellness exam 07/25/2015  . Lower back pain 01/09/2015  . Insomnia 01/09/2015  . HSV-1 (herpes simplex virus 1) infection 01/09/2015  . Depression, major, in remission ([place])   . Anxiety   . Glaucoma   . Arthritis   . History of squamous cell carcinoma    Allergies Allergies  Allergen Reactions  . Codeine     REACTION: Itch/nausea/vomiting  . Meperidine Hcl     REACTION: itch  . Morphine     REACTION: tachycardia  . Latex Itching and Rash    SURGICAL HISTORY She  has a past surgical history that includes Abdominal hysterectomy; Breast surgery; Cervical fusion (2012); Squamous cell carcinoma excision; Anterior cruciate ligament repair (Bilateral, 1998);  Augmentation mammaplasty (Bilateral); and Colonoscopy (2009). FAMILY HISTORY Her family history includes COPD in her sister; Diabetes in her mother; Glaucoma in her mother; Heart attack in her father; Heart disease in her mother; Hypertension in her mother; Throat cancer in her father. SOCIAL HISTORY She  reports that she has never smoked. She has never used smokeless tobacco. She reports that she drinks about 4.0 standard drinks of alcohol per week. She reports that she does not use drugs.    Review of Systems  Constitutional: Negative.   HENT: Negative.   Eyes: Negative.   Respiratory: Negative.   Cardiovascular: Negative.   Gastrointestinal: Negative.   Genitourinary: Negative.   Musculoskeletal: Negative.   Skin: Negative.   Neurological: Negative.   Endo/Heme/Allergies: Negative.   Psychiatric/Behavioral: Negative.     Physical Exam: Estimated body mass index is 17.01 kg/m as calculated from the following:   Height as of this encounter: 5\' 6"  (1.676 m).   Weight as of this encounter: 105 lb 6.4 oz (47.8 kg). Vitals:   07/28/18 1120  BP: 110/68  Pulse:  64  Resp: 16  Temp: (!) 97 F (36.1 C)  SpO2: 97%   General Appearance: Well nourished, in no apparent distress. Eyes: PERRLA, EOMs, conjunctiva no swelling or erythema, normal fundi and vessels. Sinuses: No Frontal/maxillary tenderness ENT/Mouth: Ext aud canals clear, normal light reflex with TMs without erythema, bulging.  Good dentition. No erythema, swelling, or exudate on post pharynx. Tonsils not swollen or erythematous. Hearing normal.  Neck: Supple, thyroid normal. No bruits Respiratory: Respiratory effort normal, BS equal bilaterally without rales, rhonchi, wheezing or stridor. Cardio: RRR without murmurs, rubs or gallops. Brisk peripheral pulses without edema.  Chest: symmetric, with normal excursions and percussion. Breasts: Symmetric, without lumps, nipple discharge, retractions. + bilateral  implants Abdomen: Soft, +BS. Non tender, no guarding, rebound, hernias, masses, or organomegaly. . Lymphatics: Non tender without lymphadenopathy.  Genitourinary: defer Musculoskeletal: Full ROM all peripheral extremities,5/5 strength, and normal gait.  Skin: Warm, dry without rashes, lesions, ecchymosis.  Neuro: Cranial nerves intact, reflexes equal bilaterally. Normal muscle tone, no cerebellar symptoms. Sensation intact.  Psych: Awake and oriented X 3, normal affect, Insight and Judgment appropriate.    Vicie Mutters 11:53 AM

## 2018-07-28 ENCOUNTER — Ambulatory Visit (INDEPENDENT_AMBULATORY_CARE_PROVIDER_SITE_OTHER): Payer: PPO | Admitting: Physician Assistant

## 2018-07-28 ENCOUNTER — Encounter: Payer: Self-pay | Admitting: Physician Assistant

## 2018-07-28 VITALS — BP 110/68 | HR 64 | Temp 97.0°F | Resp 16 | Ht 66.0 in | Wt 105.4 lb

## 2018-07-28 DIAGNOSIS — F419 Anxiety disorder, unspecified: Secondary | ICD-10-CM

## 2018-07-28 DIAGNOSIS — M8589 Other specified disorders of bone density and structure, multiple sites: Secondary | ICD-10-CM

## 2018-07-28 DIAGNOSIS — K219 Gastro-esophageal reflux disease without esophagitis: Secondary | ICD-10-CM

## 2018-07-28 DIAGNOSIS — Z0000 Encounter for general adult medical examination without abnormal findings: Secondary | ICD-10-CM

## 2018-07-28 DIAGNOSIS — E039 Hypothyroidism, unspecified: Secondary | ICD-10-CM

## 2018-07-28 DIAGNOSIS — H409 Unspecified glaucoma: Secondary | ICD-10-CM

## 2018-07-28 DIAGNOSIS — Z13220 Encounter for screening for lipoid disorders: Secondary | ICD-10-CM

## 2018-07-28 DIAGNOSIS — Z79899 Other long term (current) drug therapy: Secondary | ICD-10-CM

## 2018-07-28 DIAGNOSIS — Z8589 Personal history of malignant neoplasm of other organs and systems: Secondary | ICD-10-CM

## 2018-07-28 DIAGNOSIS — E559 Vitamin D deficiency, unspecified: Secondary | ICD-10-CM | POA: Diagnosis not present

## 2018-07-28 DIAGNOSIS — J449 Chronic obstructive pulmonary disease, unspecified: Secondary | ICD-10-CM

## 2018-07-28 DIAGNOSIS — M1990 Unspecified osteoarthritis, unspecified site: Secondary | ICD-10-CM

## 2018-07-28 DIAGNOSIS — Z0001 Encounter for general adult medical examination with abnormal findings: Secondary | ICD-10-CM

## 2018-07-28 DIAGNOSIS — G4700 Insomnia, unspecified: Secondary | ICD-10-CM

## 2018-07-28 DIAGNOSIS — F325 Major depressive disorder, single episode, in full remission: Secondary | ICD-10-CM

## 2018-07-28 DIAGNOSIS — B009 Herpesviral infection, unspecified: Secondary | ICD-10-CM

## 2018-07-28 MED ORDER — LORAZEPAM 2 MG PO TABS: 1.0000 mg | ORAL_TABLET | Freq: Every evening | ORAL | 0 refills | Status: DC | PRN

## 2018-07-28 MED ORDER — FAMOTIDINE 40 MG PO TABS: 40.0000 mg | ORAL_TABLET | Freq: Every evening | ORAL | 1 refills | Status: DC

## 2018-07-28 NOTE — Patient Instructions (Signed)

## 2018-07-29 LAB — CBC WITH DIFFERENTIAL/PLATELET
BASOS PCT: 0.4 %
Basophils Absolute: 20 cells/uL (ref 0–200)
Eosinophils Absolute: 305 cells/uL (ref 15–500)
Eosinophils Relative: 6.1 %
HCT: 39.4 % (ref 35.0–45.0)
Hemoglobin: 13.6 g/dL (ref 11.7–15.5)
Lymphs Abs: 2130 cells/uL (ref 850–3900)
MCH: 36.9 pg — ABNORMAL HIGH (ref 27.0–33.0)
MCHC: 34.5 g/dL (ref 32.0–36.0)
MCV: 106.8 fL — ABNORMAL HIGH (ref 80.0–100.0)
MPV: 9.5 fL (ref 7.5–12.5)
Monocytes Relative: 8.3 %
Neutro Abs: 2130 cells/uL (ref 1500–7800)
Neutrophils Relative %: 42.6 %
PLATELETS: 223 10*3/uL (ref 140–400)
RBC: 3.69 10*6/uL — AB (ref 3.80–5.10)
RDW: 11.2 % (ref 11.0–15.0)
TOTAL LYMPHOCYTE: 42.6 %
WBC: 5 10*3/uL (ref 3.8–10.8)
WBCMIX: 415 {cells}/uL (ref 200–950)

## 2018-07-29 LAB — COMPLETE METABOLIC PANEL WITH GFR
AG Ratio: 1.9 (calc) (ref 1.0–2.5)
ALBUMIN MSPROF: 4.4 g/dL (ref 3.6–5.1)
ALT: 28 U/L (ref 6–29)
AST: 31 U/L (ref 10–35)
Alkaline phosphatase (APISO): 50 U/L (ref 33–130)
BUN: 18 mg/dL (ref 7–25)
CALCIUM: 9.6 mg/dL (ref 8.6–10.4)
CO2: 26 mmol/L (ref 20–32)
CREATININE: 0.78 mg/dL (ref 0.50–0.99)
Chloride: 103 mmol/L (ref 98–110)
GFR, EST AFRICAN AMERICAN: 91 mL/min/{1.73_m2} (ref >=60)
GFR, EST NON AFRICAN AMERICAN: 78 mL/min/{1.73_m2} (ref >=60)
GLOBULIN: 2.3 g/dL (ref 1.9–3.7)
Glucose, Bld: 87 mg/dL (ref 65–99)
Potassium: 5.4 mmol/L — ABNORMAL HIGH (ref 3.5–5.3)
SODIUM: 137 mmol/L (ref 135–146)
TOTAL PROTEIN: 6.7 g/dL (ref 6.1–8.1)
Total Bilirubin: 0.6 mg/dL (ref 0.2–1.2)

## 2018-07-29 LAB — LIPID PANEL
Cholesterol: 260 mg/dL — ABNORMAL HIGH
HDL: 104 mg/dL
LDL Cholesterol (Calc): 139 mg/dL — ABNORMAL HIGH
Non-HDL Cholesterol (Calc): 156 mg/dL — ABNORMAL HIGH
Total CHOL/HDL Ratio: 2.5 (calc)
Triglycerides: 74 mg/dL

## 2018-07-29 LAB — MAGNESIUM: Magnesium: 1.9 mg/dL (ref 1.5–2.5)

## 2018-07-29 LAB — VITAMIN D 25 HYDROXY (VIT D DEFICIENCY, FRACTURES): Vit D, 25-Hydroxy: 72 ng/mL (ref 30–100)

## 2018-07-29 LAB — TSH: TSH: 1.39 m[IU]/L (ref 0.40–4.50)

## 2018-08-28 DIAGNOSIS — D2261 Melanocytic nevi of right upper limb, including shoulder: Secondary | ICD-10-CM | POA: Diagnosis not present

## 2018-08-28 DIAGNOSIS — Z85828 Personal history of other malignant neoplasm of skin: Secondary | ICD-10-CM | POA: Diagnosis not present

## 2018-08-28 DIAGNOSIS — Z419 Encounter for procedure for purposes other than remedying health state, unspecified: Secondary | ICD-10-CM | POA: Diagnosis not present

## 2018-08-28 DIAGNOSIS — D225 Melanocytic nevi of trunk: Secondary | ICD-10-CM | POA: Diagnosis not present

## 2018-08-28 DIAGNOSIS — L821 Other seborrheic keratosis: Secondary | ICD-10-CM | POA: Diagnosis not present

## 2018-08-28 DIAGNOSIS — D485 Neoplasm of uncertain behavior of skin: Secondary | ICD-10-CM | POA: Diagnosis not present

## 2018-08-28 DIAGNOSIS — L820 Inflamed seborrheic keratosis: Secondary | ICD-10-CM | POA: Diagnosis not present

## 2018-08-28 DIAGNOSIS — Z85820 Personal history of malignant melanoma of skin: Secondary | ICD-10-CM | POA: Diagnosis not present

## 2018-08-28 DIAGNOSIS — L570 Actinic keratosis: Secondary | ICD-10-CM | POA: Diagnosis not present

## 2018-10-08 ENCOUNTER — Other Ambulatory Visit: Payer: Self-pay | Admitting: Physician Assistant

## 2018-10-08 DIAGNOSIS — M1990 Unspecified osteoarthritis, unspecified site: Secondary | ICD-10-CM

## 2018-10-15 ENCOUNTER — Other Ambulatory Visit: Payer: Self-pay

## 2018-10-15 DIAGNOSIS — B009 Herpesviral infection, unspecified: Secondary | ICD-10-CM

## 2018-10-15 MED ORDER — VALACYCLOVIR HCL 1 G PO TABS: 1000.0000 mg | ORAL_TABLET | Freq: Two times a day (BID) | ORAL | 0 refills | Status: DC

## 2018-11-02 ENCOUNTER — Encounter: Payer: Self-pay | Admitting: Internal Medicine

## 2018-11-18 ENCOUNTER — Telehealth: Payer: Self-pay | Admitting: Physician Assistant

## 2018-11-18 MED ORDER — FAMOTIDINE 20 MG PO TABS: 40.0000 mg | ORAL_TABLET | Freq: Every evening | ORAL | 1 refills | Status: DC

## 2018-11-18 NOTE — Telephone Encounter (Signed)
-----   Message from Elenor Quinones, [place] sent at 11/17/2018  3:33 PM EST ----- Regarding: MED CHANGE REQUEST Rx for [facility]  Pharmacy is out of 40mg s but they have 20mg s.  Pharmacy would like you to send a rx for 20mg s if you think this is ok  Please and thank you

## 2018-12-01 DIAGNOSIS — L821 Other seborrheic keratosis: Secondary | ICD-10-CM | POA: Diagnosis not present

## 2018-12-01 DIAGNOSIS — D225 Melanocytic nevi of trunk: Secondary | ICD-10-CM | POA: Diagnosis not present

## 2018-12-01 DIAGNOSIS — D2261 Melanocytic nevi of right upper limb, including shoulder: Secondary | ICD-10-CM | POA: Diagnosis not present

## 2018-12-01 DIAGNOSIS — L905 Scar conditions and fibrosis of skin: Secondary | ICD-10-CM | POA: Diagnosis not present

## 2018-12-01 DIAGNOSIS — L570 Actinic keratosis: Secondary | ICD-10-CM | POA: Diagnosis not present

## 2018-12-01 DIAGNOSIS — Z85828 Personal history of other malignant neoplasm of skin: Secondary | ICD-10-CM | POA: Diagnosis not present

## 2018-12-01 DIAGNOSIS — Z419 Encounter for procedure for purposes other than remedying health state, unspecified: Secondary | ICD-10-CM | POA: Diagnosis not present

## 2018-12-01 DIAGNOSIS — Z85820 Personal history of malignant melanoma of skin: Secondary | ICD-10-CM | POA: Diagnosis not present

## 2018-12-14 ENCOUNTER — Other Ambulatory Visit: Payer: Self-pay | Admitting: Physician Assistant

## 2018-12-14 DIAGNOSIS — Z1231 Encounter for screening mammogram for malignant neoplasm of breast: Secondary | ICD-10-CM

## 2018-12-28 ENCOUNTER — Other Ambulatory Visit: Payer: Self-pay

## 2018-12-28 DIAGNOSIS — E039 Hypothyroidism, unspecified: Secondary | ICD-10-CM

## 2018-12-28 DIAGNOSIS — F325 Major depressive disorder, single episode, in full remission: Secondary | ICD-10-CM

## 2018-12-28 DIAGNOSIS — M1990 Unspecified osteoarthritis, unspecified site: Secondary | ICD-10-CM

## 2018-12-28 MED ORDER — ESCITALOPRAM OXALATE 20 MG PO TABS: 20.0000 mg | ORAL_TABLET | Freq: Every day | ORAL | 3 refills | Status: DC

## 2018-12-28 MED ORDER — CYCLOBENZAPRINE HCL 10 MG PO TABS: ORAL_TABLET | ORAL | 0 refills | Status: DC

## 2018-12-28 MED ORDER — LEVOTHYROXINE SODIUM 50 MCG PO TABS: ORAL_TABLET | ORAL | 3 refills | Status: DC

## 2018-12-28 MED ORDER — LORAZEPAM 2 MG PO TABS: ORAL_TABLET | ORAL | 0 refills | Status: DC

## 2018-12-28 NOTE — Telephone Encounter (Signed)
Refill request for: Levothyroxine, Escitalopram, Cyclobenzaprine, Lorazepam

## 2019-02-09 NOTE — Progress Notes (Deleted)
Medicare wellness and follow up  Assessment:  Depression, major, in remission ([place]) lexapro continue it  Anxiety Continue lexapro   Arthritis PRN  Bilateral low back pain without sciatica PRN   Insomnia Continue ativan  Glaucoma Continue follow up  History of squamous cell carcinoma Continue derm follow up q 3 months  HSV-1 (herpes simplex virus 1) infection Valtrex PRN, refill today  Gastroesophageal reflux disease without esophagitis Continue H2   Hypothyroidism, unspecified hypothyroidism type Hypothyroidism-check TSH level, continue medications the same, reminded to take on an empty stomach 30-45mns before food.  - TSH  Vitamin D deficiency - VITAMIN D 25 Hydroxy (Vit-D Deficiency, Fractures)   Medication management - CBC with Differential/Platelet - BASIC METABOLIC PANEL WITH GFR - Hepatic function panel - Magnesium  Screening cholesterol level - Lipid panel   Routine general medical examination at a health care facility   Osteopenia Continue vitamin d, weight bearing exercises, due 2019   Screening for blood or protein in urine - Urinalysis, [facility] - Microalbumin / creatinine urine ratio  Back pain Will refer to PT for dry needling/evaluation  Discussed med's effects and SE's. Screening labs and tests as requested with regular follow-up as recommended. Future Appointments  Date Time Provider [place] 02/11/2019 11:00 AM CVicie Mutters PA-C [id number] None  04/08/2019  9:30 AM GI-BCG MM 3 [facility]-BCGMM [facility]  04/08/2019 10:00 AM GI-BCG DX DEXA 1 GI-BCGDG [facility]-BRE[facility] CE   Plan:   During the course of the visit the patient was educated and counseled about appropriate screening and preventive services including:    Pneumococcal vaccine   Influenza vaccine  Td vaccine  Prevnar 13  Screening electrocardiogram  Screening mammography  Bone densitometry screening  Colorectal cancer  screening  Diabetes screening  Glaucoma screening  Nutrition counseling   Advanced directives: given info/requested copies   Subjective:    HPI 68y.o. female  presents for a medicare wellness and follow up for chol, depression, SCC, vitamin D def.   She has throracic left side back pain, has been years, no injury, history of scoliosis, will take flexeril PRN, she is out and needs more, will help but she would like to try dry needling/deep tissue.   She has had allergies, on benadaryl every 4 hours.   She had a fall 1.5 months ago, did not see anyone. She states she took alk plus night time and her ativan to sleep, was getting up to go to the bathroom, she was confused, could not find anything. Hit a dresser due to imbalance, fell backwards and hit her head on the dresser. She has had some vertigo/nausea that has resolved.  She has not had any other symptoms since that time.   Her blood pressure has been controlled at home, today their BP is   She denies chest pain, shortness of breath, dizziness.  Her cholesterol is diet controlled.  Her cholesterol is controlled. The cholesterol last visit was:   Lab Results  Component Value Date   CHOL 260 (H) 07/28/2018   HDL 104 07/28/2018   LDLCALC 139 (H) 07/28/2018   TRIG 74 07/28/2018   CHOLHDL 2.5 07/28/2018   Patient is on Vitamin D supplement.  Lab Results  Component Value Date   VD25OH 72 07/28/2018   She has had a normal A1C.  Lab Results  Component Value Date   HGBA1C 5.1 11/16/2013   History of SCC and melanoma last OV, follows with DERM q 3  months.  She has glacoma and follows with her eye doctor every 6 months.  Patient is on lexapro 20 and states she is doing well with it.  Takes ativan at night for sleep.  She is on estrogen/progresterone cream, gets a combo from [place].  She has history of elevated AST/ALT, negative hepatitis 05/2014.  Lab Results  Component Value Date   ALT 28 07/28/2018   AST 31 07/28/2018    ALKPHOS 48 01/21/2017   BILITOT 0.6 07/28/2018   She is on thyroid medication. Her medication was not changed last visit.   Lab Results  Component Value Date   TSH 1.39 07/28/2018  .  BMI is There is no height or weight on file to calculate BMI., she is working on diet and exercise. She is eating more veggies, has started training. She is working on her balance as well and doing well.  Wt Readings from Last 3 Encounters:  07/28/18 105 lb 6.4 oz (47.8 kg)  06/10/18 105 lb (47.6 kg)  05/27/18 105 lb 6.4 oz (47.8 kg)     Current Medications:  Current Outpatient Medications on File Prior to Visit  Medication Sig Dispense Refill  . Cholecalciferol (VITAMIN D PO) Take 1,000 Int'l Units by mouth daily.    . Cyanocobalamin (VITAMIN B-12 PO) Take by mouth daily.    . cyclobenzaprine (FLEXERIL) 10 MG tablet TAKE 1/2 TO 1 TABLET BY MOUTH 2 TO 3 TIMES DAILY ONLY AS NEEDED FOR MUSCLE SPASMS 90 tablet 0  . dorzolamide-timolol (COSOPT) 22.3-6.8 MG/ML ophthalmic solution INSTILL 1 DROP INTO BOTH EYES EVERY 12 HOURS  3  . escitalopram (LEXAPRO) 20 MG tablet Take 1 tablet (20 mg total) by mouth daily. 90 tablet 3  . famotidine (PEPCID) 20 MG tablet Take 2 tablets (40 mg total) by mouth every evening. 180 tablet 1  . ibuprofen (ADVIL,MOTRIN) 200 MG tablet Take 200 mg by mouth every 6 (six) hours as needed.    . latanoprost (XALATAN) 0.005 % ophthalmic solution     . levothyroxine (SYNTHROID, LEVOTHROID) 50 MCG tablet TAKE ONE TABLET BY MOUTH ONCE DAILY BEFORE BREAKFAST 90 tablet 3  . LORazepam (ATIVAN) 2 MG tablet TAKE 1/2 TO 1 (ONE-HALF TO ONE) TABLET BY MOUTH ONLY IF NEEDED AT HOUR OF SLEEP & PLEASE TRY TO LIMIT TO 5 DAYS/WEEK TO AVOID ADDICTION 90 tablet 0  . valACYclovir (VALTREX) 1000 MG tablet Take 1 tablet (1,000 mg total) by mouth 2 (two) times daily. 180 tablet 0   Current Facility-Administered Medications on File Prior to Visit  Medication Dose Route Frequency Provider Last Rate Last Dose   . 0.9 %  sodium chloride infusion  500 mL Intravenous Once Nandigam, Venia Minks, MD       Immunization History  Administered Date(s) Administered  . Influenza Split 07/02/2013, 07/06/2014  . Influenza, High Dose Seasonal PF 06/26/2015, 07/11/2016, 07/31/2017, 07/02/2018  . Pneumococcal Conjugate-13 07/25/2015  . Pneumococcal Polysaccharide-23 02/03/2014  . Tdap 11/13/2012  . Zoster 11/16/2013   Health Maintenance:  Tetanus: 2014 Pneumovax: 2015 Prevnar 13: 2016 Flu vaccine: 2018 Zostavax: 2015  Pap: TAH not needed MGM: 01/2018 DEXA: 11/2015, osteopenia due 2019 Colonoscopy: 03/2008- repeat 10 years Dr. Olevia Perches EGD: 03/2008   Dr. Nicki Reaper eye doctor Oct 2018 q 3-6 months for glaucoma Dr. Jacelyn Grip Dentist every 6 months Patient Care Team: Unk Pinto, MD as PCP - General (Internal Medicine) Lafayette Dragon, MD (Inactive) as Consulting Physician (Gastroenterology) Macarthur Critchley, [place] as Referring Physician (Optometry) Martinique, Amy, MD as  Consulting Physician (Dermatology) Kristeen Miss, MD as Consulting Physician (Neurosurgery)  Medical History: Patient Active Problem List   Diagnosis Date Noted  . COPD (chronic obstructive pulmonary disease) ([place]) 08/08/2017  . Gastroesophageal reflux disease without esophagitis 01/10/2016  . Thyroid activity decreased 01/10/2016  . Osteopenia 01/10/2016  . Encounter for Medicare annual wellness exam 07/25/2015  . Lower back pain 01/09/2015  . Insomnia 01/09/2015  . HSV-1 (herpes simplex virus 1) infection 01/09/2015  . Depression, major, in remission ([place])   . Anxiety   . Glaucoma   . Arthritis   . History of squamous cell carcinoma    Allergies Allergies  Allergen Reactions  . Codeine     REACTION: Itch/nausea/vomiting  . Meperidine Hcl     REACTION: itch  . Morphine     REACTION: tachycardia  . Latex Itching and Rash    SURGICAL HISTORY She  has a past surgical history that includes Abdominal hysterectomy; Breast surgery;  Cervical fusion (2012); Squamous cell carcinoma excision; Anterior cruciate ligament repair (Bilateral, 1998); Augmentation mammaplasty (Bilateral); and Colonoscopy (2009). FAMILY HISTORY Her family history includes COPD in her sister; Diabetes in her mother; Glaucoma in her mother; Heart attack in her father; Heart disease in her mother; Hypertension in her mother; Throat cancer in her father. SOCIAL HISTORY She  reports that she has never smoked. She has never used smokeless tobacco. She reports current alcohol use of about 4.0 standard drinks of alcohol per week. She reports that she does not use drugs.  MEDICARE WELLNESS OBJECTIVES: Physical activity:   Cardiac risk factors:   Depression/mood screen:   Depression screen [facility] 2/9 01/21/2018  Decreased Interest 0  Down, Depressed, Hopeless 0  PHQ - 2 Score 0    ADLs:  No flowsheet data found.   Cognitive Testing  Alert? Yes  Normal Appearance?Yes  Oriented to person? Yes  Place? Yes   Time? Yes  Recall of three objects?  Yes  Can perform simple calculations? Yes  Displays appropriate judgment?Yes  Can read the correct time from a watch face?Yes  EOL planning:      Review of Systems  Constitutional: Negative.   HENT: Negative.   Eyes: Negative.   Respiratory: Negative.   Cardiovascular: Negative.   Gastrointestinal: Negative.   Genitourinary: Negative.   Musculoskeletal: Negative.   Skin: Negative.   Neurological: Negative.   Endo/Heme/Allergies: Negative.   Psychiatric/Behavioral: Negative.     Physical Exam: Estimated body mass index is 17.01 kg/m as calculated from the following:   Height as of 07/28/18: 5' 6" (1.676 m).   Weight as of 07/28/18: 105 lb 6.4 oz (47.8 kg). There were no vitals filed for this visit. General Appearance: Well nourished, in no apparent distress. Eyes: PERRLA, EOMs, conjunctiva no swelling or erythema, normal fundi and vessels. Sinuses: No Frontal/maxillary tenderness ENT/Mouth: Ext  aud canals clear, normal light reflex with TMs without erythema, bulging.  Good dentition. No erythema, swelling, or exudate on post pharynx. Tonsils not swollen or erythematous. Hearing normal.  Neck: Supple, thyroid normal. No bruits Respiratory: Respiratory effort normal, BS equal bilaterally without rales, rhonchi, wheezing or stridor. Cardio: RRR without murmurs, rubs or gallops. Brisk peripheral pulses without edema.  Chest: symmetric, with normal excursions and percussion. Breasts: Symmetric, without lumps, nipple discharge, retractions. + bilateral implants Abdomen: Soft, +BS. Non tender, no guarding, rebound, hernias, masses, or organomegaly. .  Lymphatics: Non tender without lymphadenopathy.  Genitourinary: defer Musculoskeletal: Full ROM all peripheral extremities,5/5 strength, and normal gait.  Skin:  Warm, dry without rashes, lesions, ecchymosis.  Neuro: Cranial nerves intact, reflexes equal bilaterally. Normal muscle tone, no cerebellar symptoms. Sensation intact.  Psych: Awake and oriented X 3, normal affect, Insight and Judgment appropriate.   Medicare Attestation I have personally reviewed: The patient's medical and social history Their use of alcohol, tobacco or illicit drugs Their current medications and supplements The patient's functional ability including ADLs,fall risks, home safety risks, cognitive, and hearing and visual impairment Diet and physical activities Evidence for depression or mood disorders  The patient's weight, height, BMI, and visual acuity have been recorded in the chart.  I have made referrals, counseling, and provided education to the patient based on review of the above and I have provided the patient with a written personalized care plan for preventive services.   Vicie Mutters 4:22 PM

## 2019-02-10 NOTE — Progress Notes (Signed)
Medicare wellness and follow up  Assessment:  Depression, major, in remission ([place]) lexapro continue it  Anxiety Continue lexapro   Arthritis PRN  Bilateral low back pain without sciatica PRN   Insomnia Continue ativan  Glaucoma Continue follow up  History of squamous cell carcinoma Continue derm follow up q 3 months  HSV-1 (herpes simplex virus 1) infection Valtrex PRN, refill today  Gastroesophageal reflux disease without esophagitis Continue H2   Hypothyroidism, unspecified hypothyroidism type Hypothyroidism-check TSH level, continue medications the same, reminded to take on an empty stomach 30-64mins before food.  - TSH  Vitamin D deficiency - VITAMIN D 25 Hydroxy (Vit-D Deficiency, Fractures)   Medication management - CBC with Differential/Platelet - BASIC METABOLIC PANEL WITH GFR - Hepatic function panel - Magnesium  Screening cholesterol level - Lipid panel   Routine general medical examination at a health care facility   Osteopenia Continue vitamin d, weight bearing exercises, due 2019    Discussed med's effects and SE's. Screening labs and tests as requested with regular follow-up as recommended. Future Appointments  Date Time Provider [place]  04/08/2019  9:30 AM GI-BCG MM 3 GI-BCGMM [facility]  04/08/2019 10:00 AM GI-BCG DX DEXA 1 GI-BCGDG GI-BREAST CE   Plan:   During the course of the visit the patient was educated and counseled about appropriate screening and preventive services including:    Pneumococcal vaccine   Influenza vaccine  Td vaccine  Prevnar 13  Screening electrocardiogram  Screening mammography  Bone densitometry screening  Colorectal cancer screening  Diabetes screening  Glaucoma screening  Nutrition counseling   Advanced directives: given info/requested copies   Subjective:    HPI 68 y.o. female  presents for a medicare wellness and follow up for chol, depression, SCC, vitamin D def.    Her sister, Sharyn Lull, died in 02-11-23 due to COPD, lung cancer. Louie Casa her husband, his son got in a shoot out with police and that is stressful.   She has had allergies, on benadaryl every 4 hours.   Her blood pressure has been controlled at home, today their BP is BP: 116/74 She denies chest pain, shortness of breath, dizziness.   BMI is Body mass index is 16.79 kg/m., she is working on diet and exercise. Wt Readings from Last 3 Encounters:  02/11/19 104 lb (47.2 kg)  07/28/18 105 lb 6.4 oz (47.8 kg)  06/10/18 105 lb (47.6 kg)   Her cholesterol is diet controlled.  Her cholesterol is controlled. The cholesterol last visit was:   Lab Results  Component Value Date   CHOL 260 (H) 07/28/2018   HDL 104 07/28/2018   LDLCALC 139 (H) 07/28/2018   TRIG 74 07/28/2018   CHOLHDL 2.5 07/28/2018   Patient is on Vitamin D supplement.  Lab Results  Component Value Date   VD25OH 72 07/28/2018   She has had a normal A1C.  Lab Results  Component Value Date   HGBA1C 5.1 11/16/2013   History of SCC and melanoma last OV, follows with DERM q 3 months.  She has glacoma and follows with her eye doctor every 6 months.  Patient is on lexapro 20 and states she is doing well with it.  Takes ativan at night for sleep.  She is on estrogen/progresterone cream, gets a combo from [place].  She has history of elevated AST/ALT, negative hepatitis 05/2014.  Lab Results  Component Value Date   ALT 28 07/28/2018   AST 31 07/28/2018   ALKPHOS 48 01/21/2017   BILITOT  0.6 07/28/2018   She is on thyroid medication. Her medication was not changed last visit.   Lab Results  Component Value Date   TSH 1.39 07/28/2018  .   Current Medications:  Current Outpatient Medications on File Prior to Visit  Medication Sig Dispense Refill  . Cholecalciferol (VITAMIN D PO) Take 1,000 Int'l Units by mouth daily.    . Cyanocobalamin (VITAMIN B-12 PO) Take by mouth daily.    . cyclobenzaprine (FLEXERIL) 10 MG tablet  TAKE 1/2 TO 1 TABLET BY MOUTH 2 TO 3 TIMES DAILY ONLY AS NEEDED FOR MUSCLE SPASMS 90 tablet 0  . dorzolamide-timolol (COSOPT) 22.3-6.8 MG/ML ophthalmic solution INSTILL 1 DROP INTO BOTH EYES EVERY 12 HOURS  3  . escitalopram (LEXAPRO) 20 MG tablet Take 1 tablet (20 mg total) by mouth daily. 90 tablet 3  . famotidine (PEPCID) 20 MG tablet Take 2 tablets (40 mg total) by mouth every evening. 180 tablet 1  . ibuprofen (ADVIL,MOTRIN) 200 MG tablet Take 200 mg by mouth every 6 (six) hours as needed.    . latanoprost (XALATAN) 0.005 % ophthalmic solution     . levothyroxine (SYNTHROID, LEVOTHROID) 50 MCG tablet TAKE ONE TABLET BY MOUTH ONCE DAILY BEFORE BREAKFAST 90 tablet 3  . LORazepam (ATIVAN) 2 MG tablet TAKE 1/2 TO 1 (ONE-HALF TO ONE) TABLET BY MOUTH ONLY IF NEEDED AT HOUR OF SLEEP & PLEASE TRY TO LIMIT TO 5 DAYS/WEEK TO AVOID ADDICTION 90 tablet 0  . valACYclovir (VALTREX) 1000 MG tablet Take 1 tablet (1,000 mg total) by mouth 2 (two) times daily. 180 tablet 0   Current Facility-Administered Medications on File Prior to Visit  Medication Dose Route Frequency Provider Last Rate Last Dose  . 0.9 %  sodium chloride infusion  500 mL Intravenous Once Nandigam, Venia Minks, MD       Immunization History  Administered Date(s) Administered  . Influenza Split 07/02/2013, 07/06/2014  . Influenza, High Dose Seasonal PF 06/26/2015, 07/11/2016, 07/31/2017, 07/02/2018  . Pneumococcal Conjugate-13 07/25/2015  . Pneumococcal Polysaccharide-23 02/03/2014  . Tdap 11/13/2012  . Zoster 11/16/2013   Health Maintenance:  Tetanus: 2014 Pneumovax: 2015 Prevnar 13: 2016 Flu vaccine: 2018 Zostavax: 2015  Pap: TAH not needed MGM: 01/2018 DEXA: 11/2015, osteopenia due 2019 Colonoscopy: 03/2018 EGD: 03/2008   Dr. Nicki Reaper eye doctor Oct 2018 q 3-6 months for glaucoma Dr. Jacelyn Grip Dentist every 6 months Patient Care Team: Unk Pinto, MD as PCP - General (Internal Medicine) Lafayette Dragon, MD (Inactive) as  Consulting Physician (Gastroenterology) Macarthur Critchley, [place] as Referring Physician (Optometry) Martinique, Amy, MD as Consulting Physician (Dermatology) Kristeen Miss, MD as Consulting Physician (Neurosurgery)  Medical History: Patient Active Problem List   Diagnosis Date Noted  . COPD (chronic obstructive pulmonary disease) ([place]) 08/08/2017  . Gastroesophageal reflux disease without esophagitis 01/10/2016  . Thyroid activity decreased 01/10/2016  . Osteopenia 01/10/2016  . Encounter for Medicare annual wellness exam 07/25/2015  . Lower back pain 01/09/2015  . Insomnia 01/09/2015  . HSV-1 (herpes simplex virus 1) infection 01/09/2015  . Depression, major, in remission ([place])   . Anxiety   . Glaucoma   . Arthritis   . History of squamous cell carcinoma    Allergies Allergies  Allergen Reactions  . Codeine     REACTION: Itch/nausea/vomiting  . Meperidine Hcl     REACTION: itch  . Morphine     REACTION: tachycardia  . Latex Itching and Rash    SURGICAL HISTORY She  has a past surgical  history that includes Abdominal hysterectomy; Breast surgery; Cervical fusion (2012); Squamous cell carcinoma excision; Anterior cruciate ligament repair (Bilateral, 1998); Augmentation mammaplasty (Bilateral); and Colonoscopy (2009). FAMILY HISTORY Her family history includes COPD in her sister; Diabetes in her mother; Glaucoma in her mother; Heart attack in her father; Heart disease in her mother; Hypertension in her mother; Throat cancer in her father. SOCIAL HISTORY She  reports that she has never smoked. She has never used smokeless tobacco. She reports current alcohol use of about 4.0 standard drinks of alcohol per week. She reports that she does not use drugs.  MEDICARE WELLNESS OBJECTIVES: Physical activity:   Cardiac risk factors:   Depression/mood screen:   Depression screen [facility] 2/9 01/21/2018  Decreased Interest 0  Down, Depressed, Hopeless 0  PHQ - 2 Score 0    ADLs:  No flowsheet data  found.   Cognitive Testing  Alert? Yes  Normal Appearance?Yes  Oriented to person? Yes  Place? Yes   Time? Yes  Recall of three objects?  Yes  Can perform simple calculations? Yes  Displays appropriate judgment?Yes  Can read the correct time from a watch face?Yes  EOL planning: Does Patient Have a Medical Advance Directive?: Yes Type of Advance Directive: Healthcare Power of Attorney, Living will Copy of [place] in Chart?: No - copy requested    Review of Systems  Constitutional: Negative.   HENT: Negative.   Eyes: Negative.   Respiratory: Negative.   Cardiovascular: Negative.   Gastrointestinal: Negative.   Genitourinary: Negative.   Musculoskeletal: Negative.   Skin: Negative.   Neurological: Negative.   Endo/Heme/Allergies: Negative.   Psychiatric/Behavioral: Negative.     Physical Exam: Estimated body mass index is 16.79 kg/m as calculated from the following:   Height as of this encounter: 5\' 6"  (1.676 m).   Weight as of this encounter: 104 lb (47.2 kg). Vitals:   02/11/19 1104  BP: 116/74  Pulse: 83  Temp: (!) 97.4 F (36.3 C)  SpO2: 98%   General Appearance: Well nourished, in no apparent distress. Eyes: PERRLA, EOMs, conjunctiva no swelling or erythema, normal fundi and vessels. Sinuses: No Frontal/maxillary tenderness ENT/Mouth: Ext aud canals clear, normal light reflex with TMs without erythema, bulging.  Good dentition. No erythema, swelling, or exudate on post pharynx. Tonsils not swollen or erythematous. Hearing normal.  Neck: Supple, thyroid normal. No bruits Respiratory: Respiratory effort normal, BS equal bilaterally without rales, rhonchi, wheezing or stridor. Cardio: RRR without murmurs, rubs or gallops. Brisk peripheral pulses without edema.  Chest: symmetric, with normal excursions and percussion. Breasts: Symmetric, without lumps, nipple discharge, retractions. + bilateral implants Abdomen: Soft, +BS. Non tender, no  guarding, rebound, hernias, masses, or organomegaly. .  Lymphatics: Non tender without lymphadenopathy.  Genitourinary: defer Musculoskeletal: Full ROM all peripheral extremities,5/5 strength, and normal gait.  Skin: Warm, dry without rashes, lesions, ecchymosis.  Neuro: Cranial nerves intact, reflexes equal bilaterally. Normal muscle tone, no cerebellar symptoms. Sensation intact.  Psych: Awake and oriented X 3, normal affect, Insight and Judgment appropriate.   Medicare Attestation I have personally reviewed: The patient's medical and social history Their use of alcohol, tobacco or illicit drugs Their current medications and supplements The patient's functional ability including ADLs,fall risks, home safety risks, cognitive, and hearing and visual impairment Diet and physical activities Evidence for depression or mood disorders  The patient's weight, height, BMI, and visual acuity have been recorded in the chart.  I have made referrals, counseling, and provided education to the  patient based on review of the above and I have provided the patient with a written personalized care plan for preventive services.   Vicie Mutters 11:30 AM

## 2019-02-11 ENCOUNTER — Other Ambulatory Visit: Payer: Self-pay

## 2019-02-11 ENCOUNTER — Encounter: Payer: Self-pay | Admitting: Physician Assistant

## 2019-02-11 ENCOUNTER — Ambulatory Visit (INDEPENDENT_AMBULATORY_CARE_PROVIDER_SITE_OTHER): Payer: PPO | Admitting: Physician Assistant

## 2019-02-11 ENCOUNTER — Ambulatory Visit: Payer: Self-pay | Admitting: Physician Assistant

## 2019-02-11 VITALS — BP 116/74 | HR 83 | Temp 97.4°F | Ht 66.0 in | Wt 104.0 lb

## 2019-02-11 DIAGNOSIS — G4700 Insomnia, unspecified: Secondary | ICD-10-CM

## 2019-02-11 DIAGNOSIS — H409 Unspecified glaucoma: Secondary | ICD-10-CM

## 2019-02-11 DIAGNOSIS — E039 Hypothyroidism, unspecified: Secondary | ICD-10-CM | POA: Diagnosis not present

## 2019-02-11 DIAGNOSIS — E785 Hyperlipidemia, unspecified: Secondary | ICD-10-CM

## 2019-02-11 DIAGNOSIS — Z0000 Encounter for general adult medical examination without abnormal findings: Secondary | ICD-10-CM

## 2019-02-11 DIAGNOSIS — M1990 Unspecified osteoarthritis, unspecified site: Secondary | ICD-10-CM | POA: Diagnosis not present

## 2019-02-11 DIAGNOSIS — M545 Low back pain, unspecified: Secondary | ICD-10-CM

## 2019-02-11 DIAGNOSIS — M8589 Other specified disorders of bone density and structure, multiple sites: Secondary | ICD-10-CM

## 2019-02-11 DIAGNOSIS — K219 Gastro-esophageal reflux disease without esophagitis: Secondary | ICD-10-CM

## 2019-02-11 DIAGNOSIS — Z0001 Encounter for general adult medical examination with abnormal findings: Secondary | ICD-10-CM

## 2019-02-11 DIAGNOSIS — F325 Major depressive disorder, single episode, in full remission: Secondary | ICD-10-CM

## 2019-02-11 DIAGNOSIS — F419 Anxiety disorder, unspecified: Secondary | ICD-10-CM | POA: Diagnosis not present

## 2019-02-11 DIAGNOSIS — E559 Vitamin D deficiency, unspecified: Secondary | ICD-10-CM

## 2019-02-11 DIAGNOSIS — Z79899 Other long term (current) drug therapy: Secondary | ICD-10-CM | POA: Diagnosis not present

## 2019-02-11 DIAGNOSIS — Z8589 Personal history of malignant neoplasm of other organs and systems: Secondary | ICD-10-CM

## 2019-02-11 DIAGNOSIS — B009 Herpesviral infection, unspecified: Secondary | ICD-10-CM | POA: Diagnosis not present

## 2019-02-11 DIAGNOSIS — R6889 Other general symptoms and signs: Secondary | ICD-10-CM | POA: Diagnosis not present

## 2019-02-11 DIAGNOSIS — J449 Chronic obstructive pulmonary disease, unspecified: Secondary | ICD-10-CM

## 2019-02-11 MED ORDER — LEVOTHYROXINE SODIUM 50 MCG PO TABS: ORAL_TABLET | ORAL | 3 refills | Status: DC

## 2019-02-11 MED ORDER — LORAZEPAM 2 MG PO TABS: ORAL_TABLET | ORAL | 0 refills | Status: DC

## 2019-02-11 MED ORDER — ESCITALOPRAM OXALATE 20 MG PO TABS: 20.0000 mg | ORAL_TABLET | Freq: Every day | ORAL | 3 refills | Status: DC

## 2019-02-11 MED ORDER — CYCLOBENZAPRINE HCL 10 MG PO TABS: ORAL_TABLET | ORAL | 0 refills | Status: DC

## 2019-02-11 MED ORDER — FAMOTIDINE 20 MG PO TABS: 40.0000 mg | ORAL_TABLET | Freq: Every evening | ORAL | 1 refills | Status: DC

## 2019-02-11 MED ORDER — VALACYCLOVIR HCL 1 G PO TABS: 1000.0000 mg | ORAL_TABLET | Freq: Two times a day (BID) | ORAL | 0 refills | Status: DC

## 2019-02-11 NOTE — Patient Instructions (Signed)
AN UPDATE FROM [facility] Remember information is changing on an hourly basis, we are doing our best to stay up to date on the information.  If you have a question or concern please contact our office.   Daily Quarantine Questions to lift the spirit and help the body... - Who am I checking on or connecting with today? - What expectations of "normal" am I letting go of today? - How am I getting outside today? - How am I expressing my creativity today? - How am I moving my body today? - What type of self-care am I practicing today? - What am I grateful for today?  What are the symptoms? There are many different symptoms reported such as diarrhea, loss of smell, muscle aches HOWEVER the most common associated with the virus at this time is FEVER, DRY COUGH, and SHORTNESS OF BREATH.   What is shortness of breath that is concerning? - If you are unable to talk in full sentences - if you are unable to take a full breath (not to deep) and hold for 20-25 seconds.  - If you are panting while walking.  Some shortness of breath is common with this virus. Please do not panic. The best thing to do with any symptoms is to call the office. We can see you on video or talk with you on the phone to best determine if you need to go to the ER or get tested.   What to do if you have symptoms or you feel that you have come in contact with someone that may be infected with coronavirus? Please stay at home.  Call our office or send a MyChart message.  Please remember the majority of cases are self-limited and do not require in hospital intervention.  There is no specific treatment for a mild case of the virus other than supportive care.  Please only go to the ER if your symptoms escalate such as worsening shortness of breath, chest pain, and confusion.   As we mentioned before we are continually getting new information and there is data showing that even if you do not  have symptoms you may be able to spread the virus. Therefore, the best thing to do is to stay home! Please limit your contact with people, be mindful of social distancing.   At this time we are STILL NOT accepting walk in appointments or sick visits.  We are still having asymptomatic patients come into the office however we are asking that you call the office from your car to notify that you are here and we can check you into your appointment via the telephone.  We will have a nurse call you when she is ready for you to come into the office, this way we will not have patients waiting in the waiting room and can be more efficient.   Virtual visits and Evisits are in full swing and so far the patients are really enjoying them For your convenience and to prevent transmission, we are now performing Evisits or telephone encounters for virtual visits for sick visits and regular scheduled follow up visits.  You can contact one of your providers through [facility], your patient portal at any time or call the office during business hours.   Medicare and insurances are covering this during this crisis.  This helps you because we are able to talk about your conditions, discuss things to monitor and do; in addition, it also helps [place] as a  practice. We are a small private practice and need our patients help to remain open by doing these visits.  So please consider doing a virtual visit rather than rescheduling completely!  PLEASE DO NOT CLICK THE EVISIT BUTTON ON MY CHART, INSTEAD CHOOSE TO MESSAGE YOUR PROVIDER AND SEND [place] A MESSAGE WITH YOUR ISSUES.   As a community we are no longer testing patients with mild symptoms.  If the symptoms are mild, we are NOT testing patients to conserve supplies and capacity so our health care workers can care for people who need medical attention even during the peak of the outbreak.  Patients with mild symptoms are being instructed to stay at home and recover for 2 weeks.  You  can stay in contact with [place] during that time by mychart, telephone calls or we are starting virtual office visits with video capability.   Mild symptoms include cough,fever WITHOUT any of the following symptoms: Difficulty breathing, chest discomfort, altered thinking and confusion.  Please notify us immediately if you have these symptoms.   We have also decided NOT to do testing in our office for coronavirus at this time though we are continually accessing the situation as needed and will notify you if this changes.    If after a mychart communication or telephone visit, we feel you need testing then we will put in an order for you to get tested at a specimen collection site through [facility].   We will not test you if you do not have symptoms or have not had potential contact at this time.  We will not test you if you just show up to the office, you need an appointment specifically for testing, please call or message first.   Please remember that this virus is happening at the same time as other respiratory viruses, such as influenza, common colds and now allergy season.   To reduce your risk of infection we recommend the same precautions as those used to avoid the common cold and flu virus  . Please wash your hands with soap and water for 20 seconds and frequently. Wendee Copp your hands before you eat or touch your face.  . Avoid touching your face, eyes, nose, and mouth as much as possible.  . if needing to cough or sneeze cover your mouth and nose by coughing or sneezing into your elbow area, your sleeve or a tissue . Routinely clean frequently touched items such as doorknobs, keyboards, and phones.  . Avoid crowds of people.  Marland Kitchen avoid shaking hands with others; . We recommend anyone within the high-risk demographics such as older adults and those with heart/lung disease, auto-immune or immune-suppressing health conditions to consider reduced travel and public outings.  Please  refer to the sources below for current updates, guidelines and recommendations.  You can call this hotline set up by [facility] 1 [phone] 40COVID [phone])  You can visit these websites: CDC.gov WHO.int

## 2019-02-12 LAB — CBC WITH DIFFERENTIAL/PLATELET
Absolute Monocytes: 451 cells/uL (ref 200–950)
Basophils Absolute: 21 cells/uL (ref 0–200)
Basophils Relative: 0.4 %
Eosinophils Absolute: 408 cells/uL (ref 15–500)
Eosinophils Relative: 7.7 %
HCT: 37.8 % (ref 35.0–45.0)
Hemoglobin: 13.3 g/dL (ref 11.7–15.5)
Lymphs Abs: 1760 cells/uL (ref 850–3900)
MCH: 37.8 pg — ABNORMAL HIGH (ref 27.0–33.0)
MCHC: 35.2 g/dL (ref 32.0–36.0)
MCV: 107.4 fL — ABNORMAL HIGH (ref 80.0–100.0)
MPV: 9.7 fL (ref 7.5–12.5)
Monocytes Relative: 8.5 %
Neutro Abs: 2661 cells/uL (ref 1500–7800)
Neutrophils Relative %: 50.2 %
Platelets: 253 10*3/uL (ref 140–400)
RBC: 3.52 10*6/uL — ABNORMAL LOW (ref 3.80–5.10)
RDW: 11.7 % (ref 11.0–15.0)
Total Lymphocyte: 33.2 %
WBC: 5.3 10*3/uL (ref 3.8–10.8)

## 2019-02-12 LAB — COMPLETE METABOLIC PANEL WITH GFR
AG Ratio: 1.8 (calc) (ref 1.0–2.5)
ALT: 39 U/L — ABNORMAL HIGH (ref 6–29)
AST: 35 U/L (ref 10–35)
Albumin: 4.3 g/dL (ref 3.6–5.1)
Alkaline phosphatase (APISO): 54 U/L (ref 37–153)
BUN: 20 mg/dL (ref 7–25)
CO2: 25 mmol/L (ref 20–32)
Calcium: 9.1 mg/dL (ref 8.6–10.4)
Chloride: 104 mmol/L (ref 98–110)
Creat: 0.66 mg/dL (ref 0.50–0.99)
GFR, Est African American: 105 mL/min/{1.73_m2} (ref >=60)
GFR, Est Non African American: 91 mL/min/{1.73_m2} (ref >=60)
Globulin: 2.4 g/dL (calc) (ref 1.9–3.7)
Glucose, Bld: 90 mg/dL (ref 65–99)
Potassium: 4.5 mmol/L (ref 3.5–5.3)
Sodium: 136 mmol/L (ref 135–146)
Total Bilirubin: 0.5 mg/dL (ref 0.2–1.2)
Total Protein: 6.7 g/dL (ref 6.1–8.1)

## 2019-02-12 LAB — MAGNESIUM: Magnesium: 1.9 mg/dL (ref 1.5–2.5)

## 2019-02-12 LAB — LIPID PANEL
Cholesterol: 252 mg/dL — ABNORMAL HIGH (ref <200)
HDL: 104 mg/dL (ref >=50)
LDL Cholesterol (Calc): 131 mg/dL (calc) — ABNORMAL HIGH
Non-HDL Cholesterol (Calc): 148 mg/dL (calc) — ABNORMAL HIGH (ref <130)
Total CHOL/HDL Ratio: 2.4 (calc) (ref <5.0)
Triglycerides: 75 mg/dL (ref <150)

## 2019-02-12 LAB — VITAMIN D 25 HYDROXY (VIT D DEFICIENCY, FRACTURES): Vit D, 25-Hydroxy: 67 ng/mL (ref 30–100)

## 2019-02-12 LAB — TSH: TSH: 1.08 mIU/L (ref 0.40–4.50)

## 2019-02-16 ENCOUNTER — Other Ambulatory Visit: Payer: PPO

## 2019-02-16 ENCOUNTER — Ambulatory Visit: Payer: PPO

## 2019-02-19 DIAGNOSIS — H401121 Primary open-angle glaucoma, left eye, mild stage: Secondary | ICD-10-CM | POA: Diagnosis not present

## 2019-03-02 DIAGNOSIS — D225 Melanocytic nevi of trunk: Secondary | ICD-10-CM | POA: Diagnosis not present

## 2019-03-02 DIAGNOSIS — Z419 Encounter for procedure for purposes other than remedying health state, unspecified: Secondary | ICD-10-CM | POA: Diagnosis not present

## 2019-03-02 DIAGNOSIS — L820 Inflamed seborrheic keratosis: Secondary | ICD-10-CM | POA: Diagnosis not present

## 2019-03-02 DIAGNOSIS — L570 Actinic keratosis: Secondary | ICD-10-CM | POA: Diagnosis not present

## 2019-03-02 DIAGNOSIS — Z85828 Personal history of other malignant neoplasm of skin: Secondary | ICD-10-CM | POA: Diagnosis not present

## 2019-03-02 DIAGNOSIS — D2272 Melanocytic nevi of left lower limb, including hip: Secondary | ICD-10-CM | POA: Diagnosis not present

## 2019-03-02 DIAGNOSIS — L821 Other seborrheic keratosis: Secondary | ICD-10-CM | POA: Diagnosis not present

## 2019-03-02 DIAGNOSIS — Z85820 Personal history of malignant melanoma of skin: Secondary | ICD-10-CM | POA: Diagnosis not present

## 2019-03-02 DIAGNOSIS — D2261 Melanocytic nevi of right upper limb, including shoulder: Secondary | ICD-10-CM | POA: Diagnosis not present

## 2019-03-17 DIAGNOSIS — H401111 Primary open-angle glaucoma, right eye, mild stage: Secondary | ICD-10-CM | POA: Diagnosis not present

## 2019-03-24 ENCOUNTER — Other Ambulatory Visit: Payer: Self-pay

## 2019-03-24 DIAGNOSIS — M1990 Unspecified osteoarthritis, unspecified site: Secondary | ICD-10-CM

## 2019-03-24 DIAGNOSIS — B009 Herpesviral infection, unspecified: Secondary | ICD-10-CM

## 2019-03-24 DIAGNOSIS — E039 Hypothyroidism, unspecified: Secondary | ICD-10-CM

## 2019-03-24 DIAGNOSIS — F325 Major depressive disorder, single episode, in full remission: Secondary | ICD-10-CM

## 2019-03-24 DIAGNOSIS — K219 Gastro-esophageal reflux disease without esophagitis: Secondary | ICD-10-CM

## 2019-03-24 MED ORDER — FAMOTIDINE 20 MG PO TABS: 40.0000 mg | ORAL_TABLET | Freq: Every evening | ORAL | 1 refills | Status: DC

## 2019-03-24 MED ORDER — CYCLOBENZAPRINE HCL 10 MG PO TABS: ORAL_TABLET | ORAL | 1 refills | Status: DC

## 2019-03-24 MED ORDER — ESCITALOPRAM OXALATE 20 MG PO TABS: 20.0000 mg | ORAL_TABLET | Freq: Every day | ORAL | 1 refills | Status: DC

## 2019-03-24 MED ORDER — DORZOLAMIDE HCL-TIMOLOL MAL 2-0.5 % OP SOLN: OPHTHALMIC | 1 refills | Status: AC

## 2019-03-24 MED ORDER — LEVOTHYROXINE SODIUM 50 MCG PO TABS: ORAL_TABLET | ORAL | 1 refills | Status: DC

## 2019-03-24 MED ORDER — VALACYCLOVIR HCL 1 G PO TABS: 1000.0000 mg | ORAL_TABLET | Freq: Two times a day (BID) | ORAL | 1 refills | Status: DC

## 2019-03-24 NOTE — Telephone Encounter (Signed)
Patient called to report [place] all medications sent to said pharmacy.   Request completed.

## 2019-04-08 ENCOUNTER — Ambulatory Visit: Payer: PPO

## 2019-04-08 ENCOUNTER — Other Ambulatory Visit: Payer: PPO

## 2019-04-08 DIAGNOSIS — H401111 Primary open-angle glaucoma, right eye, mild stage: Secondary | ICD-10-CM | POA: Diagnosis not present

## 2019-04-23 DIAGNOSIS — H401131 Primary open-angle glaucoma, bilateral, mild stage: Secondary | ICD-10-CM | POA: Diagnosis not present

## 2019-04-28 DIAGNOSIS — H401123 Primary open-angle glaucoma, left eye, severe stage: Secondary | ICD-10-CM | POA: Diagnosis not present

## 2019-05-27 ENCOUNTER — Ambulatory Visit
Admission: RE | Admit: 2019-05-27 | Discharge: 2019-05-27 | Disposition: A | Discharge status: Home / Self Care | Payer: PPO | Source: Ambulatory Visit | Attending: Physician Assistant | Admitting: Physician Assistant

## 2019-05-27 ENCOUNTER — Other Ambulatory Visit: Payer: Self-pay

## 2019-05-27 DIAGNOSIS — M810 Age-related osteoporosis without current pathological fracture: Secondary | ICD-10-CM | POA: Diagnosis not present

## 2019-05-27 DIAGNOSIS — Z780 Asymptomatic menopausal state: Secondary | ICD-10-CM | POA: Diagnosis not present

## 2019-05-27 DIAGNOSIS — Z1231 Encounter for screening mammogram for malignant neoplasm of breast: Secondary | ICD-10-CM | POA: Diagnosis not present

## 2019-05-27 DIAGNOSIS — M8589 Other specified disorders of bone density and structure, multiple sites: Secondary | ICD-10-CM

## 2019-05-27 DIAGNOSIS — M85852 Other specified disorders of bone density and structure, left thigh: Secondary | ICD-10-CM | POA: Diagnosis not present

## 2019-06-01 NOTE — Progress Notes (Signed)
Patient is aware of Mammogram and BMD results. Declines Fosamax at this time, will discuss this at her next office visit. -Marcelino Scot

## 2019-06-22 DIAGNOSIS — Z85820 Personal history of malignant melanoma of skin: Secondary | ICD-10-CM | POA: Diagnosis not present

## 2019-06-22 DIAGNOSIS — C44619 Basal cell carcinoma of skin of left upper limb, including shoulder: Secondary | ICD-10-CM | POA: Diagnosis not present

## 2019-06-22 DIAGNOSIS — D2272 Melanocytic nevi of left lower limb, including hip: Secondary | ICD-10-CM | POA: Diagnosis not present

## 2019-06-22 DIAGNOSIS — Z85828 Personal history of other malignant neoplasm of skin: Secondary | ICD-10-CM | POA: Diagnosis not present

## 2019-06-22 DIAGNOSIS — Z419 Encounter for procedure for purposes other than remedying health state, unspecified: Secondary | ICD-10-CM | POA: Diagnosis not present

## 2019-06-22 DIAGNOSIS — D225 Melanocytic nevi of trunk: Secondary | ICD-10-CM | POA: Diagnosis not present

## 2019-06-22 DIAGNOSIS — L821 Other seborrheic keratosis: Secondary | ICD-10-CM | POA: Diagnosis not present

## 2019-06-30 ENCOUNTER — Telehealth: Payer: Self-pay | Admitting: Physician Assistant

## 2019-06-30 DIAGNOSIS — F419 Anxiety disorder, unspecified: Secondary | ICD-10-CM

## 2019-06-30 MED ORDER — LORAZEPAM 2 MG PO TABS: ORAL_TABLET | ORAL | 0 refills | Status: DC

## 2019-06-30 NOTE — Telephone Encounter (Signed)
Sent in

## 2019-06-30 NOTE — Telephone Encounter (Signed)
-----   Message from Elenor Quinones, [place] sent at 06/28/2019 12:44 PM EDT ----- Regarding: OFFICE NOTE/ REFILL Contact: [phone] Per pt/Yellow note:   Refill on LORA[facility]AM Please & Thank You!   FYI: pharmacy: [facility]

## 2019-08-16 DIAGNOSIS — E785 Hyperlipidemia, unspecified: Secondary | ICD-10-CM | POA: Insufficient documentation

## 2019-08-16 DIAGNOSIS — E559 Vitamin D deficiency, unspecified: Secondary | ICD-10-CM | POA: Insufficient documentation

## 2019-08-16 DIAGNOSIS — Z79899 Other long term (current) drug therapy: Secondary | ICD-10-CM | POA: Insufficient documentation

## 2019-08-16 NOTE — Progress Notes (Signed)
FOLLOW UP  Assessment and Plan:   Depression, major, in remission ([place]) Remission  Hypothyroidism, unspecified type -     TSH Hypothyroidism-check TSH level, continue medications the same, reminded to take on an empty stomach 30-52mins before food.   Chronic obstructive pulmonary disease, unspecified COPD type ([place]) No triggers, well controlled symptoms, cont to monitor  Medication management -     CBC with Diff -     COMPLETE METABOLIC PANEL WITH GFR  Vitamin D deficiency Continue supplement  Hyperlipidemia, unspecified hyperlipidemia type -     Lipid Profile check lipids decrease fatty foods increase activity.  Weight loss/malnutrition Possible from eating better and not lifting weights, however will rule out pathological causes since it was unintentional.  Normal MGM, normal colonoscopy Check CXR, AB [place], labs rule out thyroid, HIV, autoimmune Will call if any new symptoms Given ensure samples.  -     DG Chest 2 View; Future -     HIV antibody -     Sedimentation rate -     C-reactive protein -     US Abdomen Complete  AB pain; Future  Cough -     DG Chest 2 View; Future - no history of smoking, history of 2nd hand smoke  Elevated LFTs -     US Abdomen Complete  AB pain; Future - neg Hep C, check AB [place], physical exam normal  Abnormal urine -     Urinalysis, Routine w reflex microscopic    Continue diet and meds as discussed. Further disposition pending results of labs. Over 60 minutes of exam, counseling, chart review, and critical decision making was performed  No future appointments.   HPI 68 y.o. female  presents for 3 month follow up on hypertension, cholesterol, prediabetes, and vitamin D deficiency.   Her blood pressure has been controlled at home, today their BP is BP: 124/80   She states she has bad allergies she is coughing. She will have itching all over but states her husband is itching as well, will take allergy pill that helps.   BMI is  Body mass index is 15.75 kg/m. She states she is losing weight without trying over the last year, has gone from 105 to 97 lbs in the past year.  She has been eating very clean, she is walking, she also states the gym is not closed so she is not lifting weights and feels this could have caused her weight loss.  She denies fatigue, denies night sweats.  She has never smoked.  Her ALT was slightly elevated for several years. Negative Hep C 2015 She had a normal colonoscopy 06/2018 Normal MGM 05/2019- has implants CXR 08/2017- COPD- DUE CT AB 2001 WNL, absent uterus Denies decreased appetite. She has had some trouble swallowing since her cervical neck surgery, she states with meats or bread will have to turn her head to the side but this has been unchanged.  She is on a thyroid medication.  Lab Results  Component Value Date   TSH 1.08 02/11/2019   Wt Readings from Last 10 Encounters:  08/17/19 97 lb 9.6 oz (44.3 kg)  02/11/19 104 lb (47.2 kg)  07/28/18 105 lb 6.4 oz (47.8 kg)  06/10/18 105 lb (47.6 kg)  05/27/18 105 lb 6.4 oz (47.8 kg)  01/21/18 108 lb 6.4 oz (49.2 kg)  08/08/17 110 lb 9.6 oz (50.2 kg)  07/29/17 111 lb (50.3 kg)  01/21/17 113 lb 6.4 oz (51.4 kg)  07/25/16 112 lb (50.8  kg)    She does workout. She denies chest pain, shortness of breath, dizziness.   She  is not  on cholesterol medication, she is on krill oil. Her cholesterol is at goal. The cholesterol last visit was:   Lab Results  Component Value Date   CHOL 252 (H) 02/11/2019   HDL 104 02/11/2019   LDLCALC 131 (H) 02/11/2019   TRIG 75 02/11/2019   CHOLHDL 2.4 02/11/2019   Patient is on Vitamin D supplement.   Lab Results  Component Value Date   VD25OH 67 02/11/2019      Current Medications:   Current Outpatient Medications (Endocrine & Metabolic):  .  levothyroxine (SYNTHROID) 50 MCG tablet, TAKE ONE TABLET BY MOUTH ONCE DAILY BEFORE BREAKFAST       Current Outpatient Medications (Analgesics):  .   ibuprofen (ADVIL,MOTRIN) 200 MG tablet, Take 200 mg by mouth every 6 (six) hours as needed.   Current Outpatient Medications (Hematological):  Marland Kitchen  Cyanocobalamin (VITAMIN B-12 PO), Take by mouth daily.   Current Outpatient Medications (Other):  Marland Kitchen  Cholecalciferol (VITAMIN D PO), Take 1,000 Int'l Units by mouth daily. .  cyclobenzaprine (FLEXERIL) 10 MG tablet, TAKE 1/2 TO 1 TABLET BY MOUTH 2 TO 3 TIMES DAILY ONLY AS NEEDED FOR MUSCLE SPASMS .  dorzolamide-timolol (COSOPT) 22.3-6.8 MG/ML ophthalmic solution, INSTILL 1 DROP INTO BOTH EYES EVERY 12 HOURS .  escitalopram (LEXAPRO) 20 MG tablet, Take 1 tablet (20 mg total) by mouth daily. .  famotidine (PEPCID) 20 MG tablet, Take 2 tablets (40 mg total) by mouth every evening. .  latanoprost (XALATAN) 0.005 % ophthalmic solution,  .  LORazepam (ATIVAN) 2 MG tablet, TAKE 1/2 TO 1 (ONE-HALF TO ONE) TABLET BY MOUTH ONLY IF NEEDED AT HOUR OF SLEEP & PLEASE TRY TO LIMIT TO 5 DAYS/WEEK TO AVOID ADDICTION .  valACYclovir (VALTREX) 1000 MG tablet, Take 1 tablet (1,000 mg total) by mouth 2 (two) times daily.  Current Facility-Administered Medications (Other):  .  0.9 %  sodium chloride infusion  Medical History:  Past Medical History:  Diagnosis Date  . Anxiety   . Arthritis   . Cancer (HCC)    skin cancer-melanoma arm  . Depression   . GERD (gastroesophageal reflux disease)   . Glaucoma   . History of squamous cell carcinoma   . Thyroid disease   . TMJ (temporomandibular joint syndrome)    wears gaurd at night    Review of Systems:  Review of Systems  Constitutional: Positive for weight loss. Negative for chills, diaphoresis, fever and malaise/fatigue.  HENT: Negative.   Eyes: Negative.   Respiratory: Positive for cough. Negative for hemoptysis, sputum production, shortness of breath and wheezing.   Cardiovascular: Negative.   Gastrointestinal: Negative.  Negative for abdominal pain, constipation and vomiting.  Genitourinary: Negative.    Musculoskeletal: Negative.   Skin: Positive for itching. Negative for rash.  Neurological: Negative.   Psychiatric/Behavioral: Negative.  Negative for depression.    Allergies Allergies  Allergen Reactions  . Codeine     REACTION: Itch/nausea/vomiting  . Meperidine Hcl     REACTION: itch  . Morphine     REACTION: tachycardia  . Latex Itching and Rash    SURGICAL HISTORY She  has a past surgical history that includes Abdominal hysterectomy; Breast surgery; Cervical fusion (2012); Squamous cell carcinoma excision; Anterior cruciate ligament repair (Bilateral, 1998); Augmentation mammaplasty (Bilateral); and Colonoscopy (2009). FAMILY HISTORY Her family history includes COPD in her sister; Diabetes in her mother; Glaucoma  in her mother; Heart attack in her father; Heart disease in her mother; Hypertension in her mother; Throat cancer in her father. SOCIAL HISTORY She  reports that she has never smoked. She has never used smokeless tobacco. She reports current alcohol use of about 4.0 standard drinks of alcohol per week. She reports that she does not use drugs.  Physical Exam: BP 124/80   Pulse 83   Temp 97.7 F (36.5 C)   Wt 97 lb 9.6 oz (44.3 kg)   SpO2 98%   BMI 15.75 kg/m  Wt Readings from Last 3 Encounters:  08/17/19 97 lb 9.6 oz (44.3 kg)  02/11/19 104 lb (47.2 kg)  07/28/18 105 lb 6.4 oz (47.8 kg)   General Appearance: Thin appearing female, in no apparent distress. Eyes: PERRLA, EOMs, conjunctiva no swelling or erythema Sinuses: No Frontal/maxillary tenderness ENT/Mouth: Ext aud canals clear, TMs without erythema, bulging. No erythema, swelling, or exudate on post pharynx.  Tonsils not swollen or erythematous. Hearing normal.  Neck: Supple, thyroid normal.  Respiratory: Respiratory effort normal, BS equal bilaterally without rales, rhonchi, wheezing or stridor.  Cardio: RRR with no MRGs. Brisk peripheral pulses without edema.  Abdomen: Soft, + BS,  Non tender, no  guarding, rebound, hernias, masses. Lymphatics: Non tender without lymphadenopathy.  Musculoskeletal: Full ROM, 5/5 strength, Normal gait Skin: Warm, dry without rashes, lesions, ecchymosis.  Neuro: Cranial nerves intact. Normal muscle tone, no cerebellar symptoms. Psych: Awake and oriented X 3, normal affect, Insight and Judgment appropriate.    Vicie Mutters, PA-C 11:24 AM [facility]

## 2019-08-17 ENCOUNTER — Ambulatory Visit (INDEPENDENT_AMBULATORY_CARE_PROVIDER_SITE_OTHER): Payer: PPO | Admitting: Physician Assistant

## 2019-08-17 ENCOUNTER — Other Ambulatory Visit: Payer: Self-pay

## 2019-08-17 ENCOUNTER — Encounter: Payer: Self-pay | Admitting: Physician Assistant

## 2019-08-17 VITALS — BP 124/80 | HR 83 | Temp 97.7°F | Wt 97.6 lb

## 2019-08-17 DIAGNOSIS — F325 Major depressive disorder, single episode, in full remission: Secondary | ICD-10-CM

## 2019-08-17 DIAGNOSIS — R8290 Unspecified abnormal findings in urine: Secondary | ICD-10-CM | POA: Diagnosis not present

## 2019-08-17 DIAGNOSIS — R634 Abnormal weight loss: Secondary | ICD-10-CM

## 2019-08-17 DIAGNOSIS — Z79899 Other long term (current) drug therapy: Secondary | ICD-10-CM

## 2019-08-17 DIAGNOSIS — E785 Hyperlipidemia, unspecified: Secondary | ICD-10-CM | POA: Diagnosis not present

## 2019-08-17 DIAGNOSIS — J449 Chronic obstructive pulmonary disease, unspecified: Secondary | ICD-10-CM

## 2019-08-17 DIAGNOSIS — E039 Hypothyroidism, unspecified: Secondary | ICD-10-CM | POA: Diagnosis not present

## 2019-08-17 DIAGNOSIS — E559 Vitamin D deficiency, unspecified: Secondary | ICD-10-CM | POA: Diagnosis not present

## 2019-08-17 DIAGNOSIS — R7989 Other specified abnormal findings of blood chemistry: Secondary | ICD-10-CM

## 2019-08-17 DIAGNOSIS — R05 Cough: Secondary | ICD-10-CM

## 2019-08-17 DIAGNOSIS — E441 Mild protein-calorie malnutrition: Secondary | ICD-10-CM | POA: Insufficient documentation

## 2019-08-17 DIAGNOSIS — R059 Cough, unspecified: Secondary | ICD-10-CM

## 2019-08-17 NOTE — Patient Instructions (Addendum)
Your LDL is the bad cholesterol that can lead to heart attack and stroke. To lower your number you can decrease your fatty foods, red meat, cheese, milk and increase fiber like whole grains and veggies. You can also add a fiber supplement like Citracel or Benefiber, these do not cause gas and bloating and are safe to use.     INFORMATION ABOUT YOUR XRAY  Can walk into [address] building for an Insurance account manager. They will have the order and take you back. You do not any paper work, I should get the result back today or tomorrow. This order is good for a year.  Can call [phone] to schedule an appointment if you wish.   YOU CAN CALL TO MAKE AN ULTRASOUND..  I have put in an order for an ultrasound for you to have You can set them up at your convenience by calling this number [phone] You will likely have the ultrasound at [address] [place]  If you have any issues call our office and we will set this up for you.   WEIGHT GAIN Can add on ensure/boost Try to make sure you are eating plenty of high calorie dense foods like: Avocado Nuts Peanut butter Oatmeal Dates Cottage cheese Greek yogurt Protein powder

## 2019-08-18 LAB — COMPLETE METABOLIC PANEL WITH GFR
AG Ratio: 2.2 (calc) (ref 1.0–2.5)
ALT: 42 U/L — ABNORMAL HIGH (ref 6–29)
AST: 42 U/L — ABNORMAL HIGH (ref 10–35)
Albumin: 4.3 g/dL (ref 3.6–5.1)
Alkaline phosphatase (APISO): 50 U/L (ref 37–153)
BUN: 17 mg/dL (ref 7–25)
CO2: 23 mmol/L (ref 20–32)
Calcium: 9 mg/dL (ref 8.6–10.4)
Chloride: 105 mmol/L (ref 98–110)
Creat: 0.59 mg/dL (ref 0.50–0.99)
GFR, Est African American: 108 mL/min/{1.73_m2} (ref >=60)
GFR, Est Non African American: 94 mL/min/{1.73_m2} (ref >=60)
Globulin: 2 g/dL (calc) (ref 1.9–3.7)
Glucose, Bld: 83 mg/dL (ref 65–99)
Potassium: 4.2 mmol/L (ref 3.5–5.3)
Sodium: 137 mmol/L (ref 135–146)
Total Bilirubin: 0.5 mg/dL (ref 0.2–1.2)
Total Protein: 6.3 g/dL (ref 6.1–8.1)

## 2019-08-18 LAB — URINALYSIS, ROUTINE W REFLEX MICROSCOPIC
Bilirubin Urine: NEGATIVE
Glucose, UA: NEGATIVE
Hgb urine dipstick: NEGATIVE
Ketones, ur: NEGATIVE
Leukocytes,Ua: NEGATIVE
Nitrite: NEGATIVE
Protein, ur: NEGATIVE
Specific Gravity, Urine: 1.017 (ref 1.001–1.03)
pH: <=5 (ref 5.0–8.0)

## 2019-08-18 LAB — CBC WITH DIFFERENTIAL/PLATELET
Absolute Monocytes: 320 cells/uL (ref 200–950)
Basophils Absolute: 18 cells/uL (ref 0–200)
Basophils Relative: 0.4 %
Eosinophils Absolute: 360 cells/uL (ref 15–500)
Eosinophils Relative: 8 %
HCT: 37.5 % (ref 35.0–45.0)
Hemoglobin: 12.8 g/dL (ref 11.7–15.5)
Lymphs Abs: 1593 cells/uL (ref 850–3900)
MCH: 36.8 pg — ABNORMAL HIGH (ref 27.0–33.0)
MCHC: 34.1 g/dL (ref 32.0–36.0)
MCV: 107.8 fL — ABNORMAL HIGH (ref 80.0–100.0)
MPV: 9.9 fL (ref 7.5–12.5)
Monocytes Relative: 7.1 %
Neutro Abs: 2210 cells/uL (ref 1500–7800)
Neutrophils Relative %: 49.1 %
Platelets: 220 10*3/uL (ref 140–400)
RBC: 3.48 10*6/uL — ABNORMAL LOW (ref 3.80–5.10)
RDW: 11.7 % (ref 11.0–15.0)
Total Lymphocyte: 35.4 %
WBC: 4.5 10*3/uL (ref 3.8–10.8)

## 2019-08-18 LAB — LIPID PANEL
Cholesterol: 217 mg/dL — ABNORMAL HIGH (ref <200)
HDL: 97 mg/dL (ref >=50)
LDL Cholesterol (Calc): 104 mg/dL (calc) — ABNORMAL HIGH
Non-HDL Cholesterol (Calc): 120 mg/dL (calc) (ref <130)
Total CHOL/HDL Ratio: 2.2 (calc) (ref <5.0)
Triglycerides: 71 mg/dL (ref <150)

## 2019-08-18 LAB — TSH: TSH: 1.09 mIU/L (ref 0.40–4.50)

## 2019-08-18 LAB — HIV ANTIBODY (ROUTINE TESTING W REFLEX): HIV 1&2 Ab, 4th Generation: NONREACTIVE

## 2019-08-18 LAB — SEDIMENTATION RATE: Sed Rate: 2 mm/h (ref 0–30)

## 2019-08-18 LAB — C-REACTIVE PROTEIN: CRP: 0.2 mg/L (ref <8.0)

## 2019-08-24 ENCOUNTER — Other Ambulatory Visit: Payer: Self-pay

## 2019-08-24 DIAGNOSIS — M1990 Unspecified osteoarthritis, unspecified site: Secondary | ICD-10-CM

## 2019-08-24 MED ORDER — CYCLOBENZAPRINE HCL 10 MG PO TABS: ORAL_TABLET | ORAL | 1 refills | Status: DC

## 2019-08-26 ENCOUNTER — Ambulatory Visit
Admission: RE | Admit: 2019-08-26 | Discharge: 2019-08-26 | Disposition: A | Discharge status: Home / Self Care | Payer: PPO | Source: Ambulatory Visit | Attending: Physician Assistant | Admitting: Physician Assistant

## 2019-08-26 DIAGNOSIS — R05 Cough: Secondary | ICD-10-CM

## 2019-08-26 DIAGNOSIS — R7989 Other specified abnormal findings of blood chemistry: Secondary | ICD-10-CM | POA: Diagnosis not present

## 2019-08-26 DIAGNOSIS — R634 Abnormal weight loss: Secondary | ICD-10-CM

## 2019-08-26 DIAGNOSIS — R059 Cough, unspecified: Secondary | ICD-10-CM

## 2019-08-30 NOTE — Progress Notes (Signed)
08/30/2019--patient is aware of abdominal ultrasound results. - e welch

## 2019-09-06 ENCOUNTER — Other Ambulatory Visit: Payer: Self-pay | Admitting: Physician Assistant

## 2019-09-06 DIAGNOSIS — F325 Major depressive disorder, single episode, in full remission: Secondary | ICD-10-CM

## 2019-09-06 DIAGNOSIS — F419 Anxiety disorder, unspecified: Secondary | ICD-10-CM

## 2019-09-06 DIAGNOSIS — E039 Hypothyroidism, unspecified: Secondary | ICD-10-CM

## 2019-09-21 DIAGNOSIS — Z419 Encounter for procedure for purposes other than remedying health state, unspecified: Secondary | ICD-10-CM | POA: Diagnosis not present

## 2019-09-21 DIAGNOSIS — L570 Actinic keratosis: Secondary | ICD-10-CM | POA: Diagnosis not present

## 2019-09-21 DIAGNOSIS — L300 Nummular dermatitis: Secondary | ICD-10-CM | POA: Diagnosis not present

## 2019-09-21 DIAGNOSIS — D225 Melanocytic nevi of trunk: Secondary | ICD-10-CM | POA: Diagnosis not present

## 2019-09-21 DIAGNOSIS — Z85820 Personal history of malignant melanoma of skin: Secondary | ICD-10-CM | POA: Diagnosis not present

## 2019-09-21 DIAGNOSIS — L821 Other seborrheic keratosis: Secondary | ICD-10-CM | POA: Diagnosis not present

## 2019-09-21 DIAGNOSIS — C44722 Squamous cell carcinoma of skin of right lower limb, including hip: Secondary | ICD-10-CM | POA: Diagnosis not present

## 2019-09-21 DIAGNOSIS — Z85828 Personal history of other malignant neoplasm of skin: Secondary | ICD-10-CM | POA: Diagnosis not present

## 2019-09-21 DIAGNOSIS — D485 Neoplasm of uncertain behavior of skin: Secondary | ICD-10-CM | POA: Diagnosis not present

## 2019-10-21 ENCOUNTER — Other Ambulatory Visit: Payer: Self-pay | Admitting: Physician Assistant

## 2019-10-21 DIAGNOSIS — M1990 Unspecified osteoarthritis, unspecified site: Secondary | ICD-10-CM

## 2019-11-01 ENCOUNTER — Other Ambulatory Visit: Payer: Self-pay | Admitting: Physician Assistant

## 2019-11-01 DIAGNOSIS — K219 Gastro-esophageal reflux disease without esophagitis: Secondary | ICD-10-CM

## 2019-12-15 ENCOUNTER — Encounter: Payer: Self-pay | Admitting: Internal Medicine

## 2019-12-15 NOTE — Progress Notes (Signed)
This encounter was created in error - please disregard.

## 2019-12-21 ENCOUNTER — Other Ambulatory Visit: Payer: Self-pay | Admitting: Physician Assistant

## 2019-12-21 DIAGNOSIS — F419 Anxiety disorder, unspecified: Secondary | ICD-10-CM

## 2019-12-22 DIAGNOSIS — L817 Pigmented purpuric dermatosis: Secondary | ICD-10-CM | POA: Diagnosis not present

## 2019-12-22 DIAGNOSIS — D2261 Melanocytic nevi of right upper limb, including shoulder: Secondary | ICD-10-CM | POA: Diagnosis not present

## 2019-12-22 DIAGNOSIS — D2272 Melanocytic nevi of left lower limb, including hip: Secondary | ICD-10-CM | POA: Diagnosis not present

## 2019-12-22 DIAGNOSIS — C44712 Basal cell carcinoma of skin of right lower limb, including hip: Secondary | ICD-10-CM | POA: Diagnosis not present

## 2019-12-22 DIAGNOSIS — L570 Actinic keratosis: Secondary | ICD-10-CM | POA: Diagnosis not present

## 2019-12-22 DIAGNOSIS — Z419 Encounter for procedure for purposes other than remedying health state, unspecified: Secondary | ICD-10-CM | POA: Diagnosis not present

## 2019-12-22 DIAGNOSIS — D485 Neoplasm of uncertain behavior of skin: Secondary | ICD-10-CM | POA: Diagnosis not present

## 2019-12-22 DIAGNOSIS — L821 Other seborrheic keratosis: Secondary | ICD-10-CM | POA: Diagnosis not present

## 2019-12-22 DIAGNOSIS — C44519 Basal cell carcinoma of skin of other part of trunk: Secondary | ICD-10-CM | POA: Diagnosis not present

## 2019-12-22 DIAGNOSIS — Z85820 Personal history of malignant melanoma of skin: Secondary | ICD-10-CM | POA: Diagnosis not present

## 2019-12-22 DIAGNOSIS — D225 Melanocytic nevi of trunk: Secondary | ICD-10-CM | POA: Diagnosis not present

## 2019-12-22 DIAGNOSIS — Z85828 Personal history of other malignant neoplasm of skin: Secondary | ICD-10-CM | POA: Diagnosis not present

## 2020-01-17 DIAGNOSIS — K2950 Unspecified chronic gastritis without bleeding: Secondary | ICD-10-CM | POA: Diagnosis not present

## 2020-01-17 DIAGNOSIS — K3189 Other diseases of stomach and duodenum: Secondary | ICD-10-CM | POA: Diagnosis not present

## 2020-02-15 NOTE — Progress Notes (Addendum)
Medicare wellness and follow up  Assessment:  Left leg swelling ADDENDUM: PATIENT STATES SHE HAS CHANGED HER DIET AND THERE IS NO MORE SWELLING, SHE WISHES TO DECLINES THE [place] WILL CANCEL BUT TOLD HER TO GO TO ER IF ANY WORSENING SWELLING, PAIN, OR ANY CHEST PAIN OR SHORTNESS OF BREATH -     VAS [place] LOWER EXTREMITY VENOUS (DVT); Future- CANCEL -     ANA -     Anti-DNA antibody, double-stranded -     Rheumatoid factor - with personal history and new left leg swelling, will get [place] to rule out DVT though low risk and non tender  Encounter for Medicare annual wellness exam 1 year  Depression, major, in remission ([place]) - continue medications, stress management techniques discussed, increase water, good sleep hygiene discussed, increase exercise, and increase veggies.   Chronic obstructive pulmonary disease, unspecified COPD type ([place]) No triggers, well controlled symptoms, cont to monitor  Mild protein-calorie malnutrition (HCC) -     COMPLETE METABOLIC PANEL WITH GFR Monitor- especially with swelling  Hypothyroidism, unspecified type -     TSH Hypothyroidism-check TSH level, continue medications the same, reminded to take on an empty stomach 30-34mins before food.   Medication management -     CBC with Differential/Platelet -     COMPLETE METABOLIC PANEL WITH GFR -     Magnesium  Vitamin D deficiency -     VITAMIN D 25 Hydroxy (Vit-D Deficiency, Fractures)  Hyperlipidemia, unspecified hyperlipidemia type -     Lipid panel check lipids decrease fatty foods increase activity.   Insomnia, unspecified type Monitor  Anxiety Monitor  Osteopenia of multiple sites Continue weight bearing exercises.   Gastroesophageal reflux disease without esophagitis Continue PPI/H2 blocker, diet discussed  Bilateral low back pain without sciatica, unspecified chronicity Monitor  Glaucoma, unspecified glaucoma type, unspecified laterality Continue follow up  History of squamous cell  carcinoma  HSV-1 (herpes simplex virus 1) infection  Other orders -     azelastine (ASTELIN) 0.1 % nasal spray; Place 2 sprays into both nostrils 2 (two) times daily. Use in each nostril as directed  Discussed med's effects and SE's. Screening labs and tests as requested with regular follow-up as recommended. Future Appointments  Date Time Provider [place]  08/24/2020 11:00 AM Vicie Mutters, PA-C [id number] None   Plan:   During the course of the visit the patient was educated and counseled about appropriate screening and preventive services including:    Pneumococcal vaccine   Influenza vaccine  Td vaccine  Prevnar 13  Screening electrocardiogram  Screening mammography  Bone densitometry screening  Colorectal cancer screening  Diabetes screening  Glaucoma screening  Nutrition counseling   Advanced directives: given info/requested copies   Subjective:    HPI 69 y.o. female  presents for a medicare wellness and follow up for chol, depression, SCC, vitamin D def.   She has bilateral leg swelling x 1 month, not better with lying down at night, no PND, no orthopnea, no SOB, working out 3-4 days a week. She has been eating cracklin lately. She wil have burning sensation in her left leg. She does have a history of blood clot provoked by surgery and mother with history of blood clot.   Lab Results  Component Value Date   ALT 42 (H) 08/17/2019   AST 42 (H) 08/17/2019   ALKPHOS 48 01/21/2017   BILITOT 0.5 08/17/2019   Lab Results  Component Value Date   LABPROT 13.4 06/03/2011   She  has had allergies, on benadaryl every 4 hours.   Her blood pressure has been controlled at home, today their BP is BP: 124/76 She denies chest pain, shortness of breath, dizziness.   BMI is Body mass index is 16.3 kg/m., she is working on diet and exercise. She has started to work out again and feels she is gaining muscle and gaining weight.  Wt Readings from Last 3  Encounters:  02/16/20 101 lb (45.8 kg)  08/17/19 97 lb 9.6 oz (44.3 kg)  02/11/19 104 lb (47.2 kg)   Her cholesterol is diet controlled.  Her cholesterol is controlled. The cholesterol last visit was:   Lab Results  Component Value Date   CHOL 217 (H) 08/17/2019   HDL 97 08/17/2019   LDLCALC 104 (H) 08/17/2019   TRIG 71 08/17/2019   CHOLHDL 2.2 08/17/2019   Patient is on Vitamin D supplement.  Lab Results  Component Value Date   VD25OH 67 02/11/2019   She has had a normal A1C.  Lab Results  Component Value Date   HGBA1C 5.1 11/16/2013   History of SCC and melanoma last OV, follows with DERM q 3 months.  She has glacoma and follows with her eye doctor every 6 months.  Patient is on lexapro 20 and states she is doing well with it.  Takes ativan at night for sleep.  She is on estrogen/progresterone cream, gets a combo from [place].  She has history of elevated AST/ALT, negative hepatitis 05/2014.  Lab Results  Component Value Date   ALT 42 (H) 08/17/2019   AST 42 (H) 08/17/2019   ALKPHOS 48 01/21/2017   BILITOT 0.5 08/17/2019   She is on thyroid medication. Her medication was not changed last visit.   Lab Results  Component Value Date   TSH 1.09 08/17/2019  .   Current Medications:   Current Outpatient Medications (Endocrine & Metabolic):  .  levothyroxine (SYNTHROID) 50 MCG tablet, TAKE ONE TABLET BY MOUTH ONCE DAILY BEFORE BREAKFAST     Current Outpatient Medications (Respiratory):  .  azelastine (ASTELIN) 0.1 % nasal spray, Place 2 sprays into both nostrils 2 (two) times daily. Use in each nostril as directed   Current Outpatient Medications (Analgesics):  .  ibuprofen (ADVIL,MOTRIN) 200 MG tablet, Take 200 mg by mouth every 6 (six) hours as needed.   Current Outpatient Medications (Hematological):  Marland Kitchen  Cyanocobalamin (VITAMIN B-12 PO), Take by mouth daily.   Current Outpatient Medications (Other):  Marland Kitchen  Cholecalciferol (VITAMIN D PO), Take 1,000 Int'l  Units by mouth daily. .  cyclobenzaprine (FLEXERIL) 10 MG tablet, Take 1/2 to 1 tablet   2 to 3  x /day as needed for Muscle Spasms .  dorzolamide-timolol (COSOPT) 22.3-6.8 MG/ML ophthalmic solution, INSTILL 1 DROP INTO BOTH EYES EVERY 12 HOURS .  escitalopram (LEXAPRO) 20 MG tablet, TAKE 1 TABLET BY MOUTH EVERY DAY .  famotidine (PEPCID) 40 MG tablet, Take 1 tablet Daily for Heartburn & Indigestion .  latanoprost (XALATAN) 0.005 % ophthalmic solution,  .  LORazepam (ATIVAN) 2 MG tablet, TAKE 1/2 TO 1 TABLET ONLY IF NEED AT BEDTIME. PLEASE TRY TO LIMIT TO 5 DAYS/WEEK TO AVOID ADDICTION .  valACYclovir (VALTREX) 1000 MG tablet, Take 1 tablet (1,000 mg total) by mouth 2 (two) times daily.  Current Facility-Administered Medications (Other):  .  0.9 %  sodium chloride infusion  Immunization History  Administered Date(s) Administered  . Fluad Quad(high Dose 65+) 07/05/2019  . Influenza Split 07/02/2013, 07/06/2014  .  Influenza, High Dose Seasonal PF 06/26/2015, 07/11/2016, 07/31/2017, 07/02/2018  . Pneumococcal Conjugate-13 07/25/2015  . Pneumococcal Polysaccharide-23 02/03/2014  . Tdap 11/13/2012  . Zoster 11/16/2013  . Zoster Recombinat (Shingrix) 07/05/2019, 09/08/2019   Health Maintenance:  Tetanus: 2014 Pneumovax: 2015 Prevnar 13: 2016 Flu vaccine: 2018 Shingrix: 2020 COVID vaccine- completed  Pap: TAH not needed MGM: 01/2018 DEXA: 11/2015, osteopenia due 2019 Colonoscopy: 03/2018 EGD: 03/2008   Dr. Nicki Reaper eye doctor Oct 2018 q 3-6 months for glaucoma Dr. Jacelyn Grip Dentist every 6 months Patient Care Team: Unk Pinto, MD as PCP - General (Internal Medicine) Lafayette Dragon, MD (Inactive) as Consulting Physician (Gastroenterology) Macarthur Critchley, [place] as Referring Physician (Optometry) [place]ique, Amy, MD as Consulting Physician (Dermatology) Kristeen Miss, MD as Consulting Physician (Neurosurgery)  Medical History: Patient Active Problem List   Diagnosis Date Noted  . Mild  protein-calorie malnutrition ([place]) 08/17/2019  . Hyperlipidemia 08/16/2019  . Vitamin D deficiency 08/16/2019  . Medication management 08/16/2019  . COPD (chronic obstructive pulmonary disease) ([place]) 08/08/2017  . Gastroesophageal reflux disease without esophagitis 01/10/2016  . Thyroid activity decreased 01/10/2016  . Osteopenia 01/10/2016  . Encounter for Medicare annual wellness exam 07/25/2015  . Lower back pain 01/09/2015  . Insomnia 01/09/2015  . HSV-1 (herpes simplex virus 1) infection 01/09/2015  . Depression, major, in remission ([place])   . Anxiety   . Glaucoma   . Arthritis   . History of squamous cell carcinoma    Allergies Allergies  Allergen Reactions  . Codeine     REACTION: Itch/nausea/vomiting  . Meperidine Hcl     REACTION: itch  . Morphine     REACTION: tachycardia  . Latex Itching and Rash    SURGICAL HISTORY She  has a past surgical history that includes Abdominal hysterectomy; Breast surgery; Cervical fusion (2012); Squamous cell carcinoma excision; Anterior cruciate ligament repair (Bilateral, 1998); Augmentation mammaplasty (Bilateral); and Colonoscopy (2009). FAMILY HISTORY Her family history includes COPD in her sister; Diabetes in her mother; Glaucoma in her mother; Heart attack in her father; Heart disease in her mother; Hypertension in her mother; Throat cancer in her father. SOCIAL HISTORY She  reports that she has never smoked. She has never used smokeless tobacco. She reports current alcohol use of about 4.0 standard drinks of alcohol per week. She reports that she does not use drugs.  MEDICARE WELLNESS OBJECTIVES: Physical activity: Current Exercise Habits: Structured exercise class, Type of exercise: walking, Time (Minutes): 30, Frequency (Times/Week): 5, Weekly Exercise (Minutes/Week): 150, Intensity: Moderate Cardiac risk factors: Cardiac Risk Factors include: advanced age (>18men, >81 women);hypertension;dyslipidemia Depression/mood screen:    Depression screen [facility] 2/9 02/16/2020  Decreased Interest 0  Down, Depressed, Hopeless 0  PHQ - 2 Score 0  Altered sleeping 0  Tired, decreased energy 0  Change in appetite 0  Feeling bad or failure about yourself  0  Trouble concentrating 0  Moving slowly or fidgety/restless 0  Suicidal thoughts 0  PHQ-9 Score 0    ADLs:  In your present state of health, do you have any difficulty performing the following activities: 02/16/2020  Hearing? N  Vision? Y  Difficulty concentrating or making decisions? N  Walking or climbing stairs? N  Dressing or bathing? N  Doing errands, shopping? N  Some recent data might be hidden     Cognitive Testing  Alert? Yes  Normal Appearance?Yes  Oriented to person? Yes  Place? Yes   Time? Yes  Recall of three objects?  Yes  Can perform simple  calculations? Yes  Displays appropriate judgment?Yes  Can read the correct time from a watch face?Yes  EOL planning: Does Patient Have a Medical Advance Directive?: Yes Type of Advance Directive: Healthcare Power of Attorney, Living will Does patient want to make changes to medical advance directive?: No - Patient declined Copy of [place] in Chart?: No - copy requested    Review of Systems  Constitutional: Negative.   HENT: Negative.   Eyes: Negative.   Respiratory: Negative.   Cardiovascular: Positive for leg swelling. Negative for chest pain, palpitations, orthopnea, claudication and PND.  Gastrointestinal: Negative.   Genitourinary: Negative.   Musculoskeletal: Negative.   Skin: Negative.   Neurological: Negative.   Endo/Heme/Allergies: Negative.   Psychiatric/Behavioral: Negative.     Physical Exam: Estimated body mass index is 16.3 kg/m as calculated from the following:   Height as of 02/11/19: 5\' 6"  (1.676 m).   Weight as of this encounter: 101 lb (45.8 kg). Vitals:   02/16/20 1120  BP: 124/76  Pulse: 76  Temp: 97.6 F (36.4 C)  SpO2: 96%   General Appearance:  Well nourished, in no apparent distress. Eyes: PERRLA, EOMs, conjunctiva no swelling or erythema, normal fundi and vessels. Sinuses: No Frontal/maxillary tenderness ENT/Mouth: Ext aud canals clear, normal light reflex with TMs without erythema, bulging.  Good dentition. No erythema, swelling, or exudate on post pharynx. Tonsils not swollen or erythematous. Hearing normal.  Neck: Supple, thyroid normal. No bruits Respiratory: Respiratory effort normal, BS equal bilaterally without rales, rhonchi, wheezing or stridor. Cardio: RRR without murmurs, rubs or gallops. Brisk peripheral pulses with mild bilateral edema, left worse than right at 1-2 + edema. No warmth, no redness, neg homen's Chest: symmetric, with normal excursions and percussion. Abdomen: Soft, +BS. Non tender, no guarding, rebound, hernias, masses, or organomegaly. .  Lymphatics: Non tender without lymphadenopathy.  Musculoskeletal: Full ROM all peripheral extremities,5/5 strength, and normal gait.  Skin: Warm, dry without rashes, lesions, ecchymosis.  Neuro: Cranial nerves intact, reflexes equal bilaterally. Normal muscle tone, no cerebellar symptoms. Sensation intact.  Psych: Awake and oriented X 3, normal affect, Insight and Judgment appropriate.   Medicare Attestation I have personally reviewed: The patient's medical and social history Their use of alcohol, tobacco or illicit drugs Their current medications and supplements The patient's functional ability including ADLs,fall risks, home safety risks, cognitive, and hearing and visual impairment Diet and physical activities Evidence for depression or mood disorders  The patient's weight, height, BMI, and visual acuity have been recorded in the chart.  I have made referrals, counseling, and provided education to the patient based on review of the above and I have provided the patient with a written personalized care plan for preventive services.   Vicie Mutters 1:28 PM

## 2020-02-16 ENCOUNTER — Other Ambulatory Visit: Payer: Self-pay

## 2020-02-16 ENCOUNTER — Encounter: Payer: Self-pay | Admitting: Physician Assistant

## 2020-02-16 ENCOUNTER — Ambulatory Visit (INDEPENDENT_AMBULATORY_CARE_PROVIDER_SITE_OTHER): Payer: PPO | Admitting: Physician Assistant

## 2020-02-16 VITALS — BP 124/76 | HR 76 | Temp 97.6°F | Wt 101.0 lb

## 2020-02-16 DIAGNOSIS — R6889 Other general symptoms and signs: Secondary | ICD-10-CM | POA: Diagnosis not present

## 2020-02-16 DIAGNOSIS — Z8589 Personal history of malignant neoplasm of other organs and systems: Secondary | ICD-10-CM

## 2020-02-16 DIAGNOSIS — E441 Mild protein-calorie malnutrition: Secondary | ICD-10-CM

## 2020-02-16 DIAGNOSIS — H409 Unspecified glaucoma: Secondary | ICD-10-CM

## 2020-02-16 DIAGNOSIS — K219 Gastro-esophageal reflux disease without esophagitis: Secondary | ICD-10-CM

## 2020-02-16 DIAGNOSIS — E785 Hyperlipidemia, unspecified: Secondary | ICD-10-CM | POA: Diagnosis not present

## 2020-02-16 DIAGNOSIS — E559 Vitamin D deficiency, unspecified: Secondary | ICD-10-CM | POA: Diagnosis not present

## 2020-02-16 DIAGNOSIS — Z79899 Other long term (current) drug therapy: Secondary | ICD-10-CM

## 2020-02-16 DIAGNOSIS — F419 Anxiety disorder, unspecified: Secondary | ICD-10-CM | POA: Diagnosis not present

## 2020-02-16 DIAGNOSIS — M545 Low back pain, unspecified: Secondary | ICD-10-CM

## 2020-02-16 DIAGNOSIS — J449 Chronic obstructive pulmonary disease, unspecified: Secondary | ICD-10-CM

## 2020-02-16 DIAGNOSIS — Z0000 Encounter for general adult medical examination without abnormal findings: Secondary | ICD-10-CM

## 2020-02-16 DIAGNOSIS — G4700 Insomnia, unspecified: Secondary | ICD-10-CM | POA: Diagnosis not present

## 2020-02-16 DIAGNOSIS — F325 Major depressive disorder, single episode, in full remission: Secondary | ICD-10-CM

## 2020-02-16 DIAGNOSIS — M7989 Other specified soft tissue disorders: Secondary | ICD-10-CM

## 2020-02-16 DIAGNOSIS — B009 Herpesviral infection, unspecified: Secondary | ICD-10-CM

## 2020-02-16 DIAGNOSIS — E039 Hypothyroidism, unspecified: Secondary | ICD-10-CM

## 2020-02-16 DIAGNOSIS — Z0001 Encounter for general adult medical examination with abnormal findings: Secondary | ICD-10-CM

## 2020-02-16 DIAGNOSIS — M8589 Other specified disorders of bone density and structure, multiple sites: Secondary | ICD-10-CM

## 2020-02-16 MED ORDER — AZELASTINE HCL 0.1 % NA SOLN: 2.0000 | Freq: Two times a day (BID) | NASAL | 2 refills | Status: DC

## 2020-02-16 NOTE — Patient Instructions (Signed)
VENOUS INSUFFICIENCY Our lower leg venous system is not the most reliable, the heart does NOT pump fluid up, there is a valve system.  The muscles of the leg squeeze and the blood moves up and a valve opens and close, then they squeeze, blood moves up and valves open and closes keeping the blood moving towards the heart.  Lots can go wrong with this valve system.  If someone is sitting or standing without movement, everyone will get swelling.  THINGS TO DO:  Do not stand or sit in one position for long periods of time. Do not sit with your legs crossed. Rest with your legs raised during the day.  Your legs have to be higher than your heart so that gravity will force the valves to open, so please really elevate your legs.   Wear elastic stockings or support hose. Do not wear other tight, encircling garments around the legs, pelvis, or waist.  [employer]  has a wide variety of well priced compression stockings. [place], [place] [id number] [address] [place] has a good cheap selection, I like the socks, they are not as hard to get on  Walk as much as possible to increase blood flow.  Raise the foot of your bed at night with 2-inch blocks.  SEEK MEDICAL CARE IF:   The skin around your ankle starts to break down.  You have pain, redness, tenderness, or hard swelling developing in your leg over a vein.  You are uncomfortable due to leg pain.  If you ever have shortness of breath with exertion or chest pain go to the ER.   Edema  Edema is an abnormal buildup of fluids in the body tissues and under the skin. Swelling of the legs, feet, and ankles is a common symptom that becomes more likely as you get older. Swelling is also common in looser tissues, like around the eyes. When the affected area is squeezed, the fluid may move out of that spot and leave a dent for a few moments. This dent is called pitting edema. There are many possible causes of edema. Eating too  much salt (sodium) and being on your feet or sitting for a long time can cause edema in your legs, feet, and ankles. Hot weather may make edema worse. Common causes of edema include:  Heart failure.  Liver or kidney disease.  Weak leg blood vessels.  Cancer.  An injury.  Pregnancy.  Medicines.  Being obese.  Low protein levels in the blood. Edema is usually painless. Your skin may look swollen or shiny. Follow these instructions at home:  Keep the affected body part raised (elevated) above the level of your heart when you are sitting or lying down.  Do not sit still or stand for long periods of time.  Do not wear tight clothing. Do not wear garters on your upper legs.  Exercise your legs to get your circulation going. This helps to move the fluid back into your blood vessels, and it may help the swelling go down.  Wear elastic bandages or support stockings to reduce swelling as told by your health care provider.  Eat a low-salt (low-sodium) diet to reduce fluid as told by your health care provider.  Depending on the cause of your swelling, you may need to limit how much fluid you drink (fluid restriction).  Take over-the-counter and prescription medicines only as told by your health care provider. Contact a health care provider if:  Your edema does not get better with treatment.  You have heart, liver, or kidney disease and have symptoms of edema.  You have sudden and unexplained weight gain. Get help right away if:  You develop shortness of breath or chest pain.  You cannot breathe when you lie down.  You develop pain, redness, or warmth in the swollen areas.  You have heart, liver, or kidney disease and suddenly get edema.  You have a fever and your symptoms suddenly get worse. Summary  Edema is an abnormal buildup of fluids in the body tissues and under the skin.  Eating too much salt (sodium) and being on your feet or sitting for a long time can cause  edema in your legs, feet, and ankles.  Keep the affected body part raised (elevated) above the level of your heart when you are sitting or lying down. This information is not intended to replace advice given to you by your health care provider. Make sure you discuss any questions you have with your health care provider. Document Revised: 02/10/2019 Document Reviewed: 10/26/2016 Elsevier Patient Education  [place].

## 2020-02-17 ENCOUNTER — Telehealth: Payer: Self-pay | Admitting: Physician Assistant

## 2020-02-17 LAB — CBC WITH DIFFERENTIAL/PLATELET
Absolute Monocytes: 430 cells/uL (ref 200–950)
Basophils Absolute: 22 cells/uL (ref 0–200)
Basophils Relative: 0.5 %
Eosinophils Absolute: 318 cells/uL (ref 15–500)
Eosinophils Relative: 7.4 %
HCT: 38.7 % (ref 35.0–45.0)
Hemoglobin: 12.9 g/dL (ref 11.7–15.5)
Lymphs Abs: 1780 cells/uL (ref 850–3900)
MCH: 36.1 pg — ABNORMAL HIGH (ref 27.0–33.0)
MCHC: 33.3 g/dL (ref 32.0–36.0)
MCV: 108.4 fL — ABNORMAL HIGH (ref 80.0–100.0)
MPV: 9.9 fL (ref 7.5–12.5)
Monocytes Relative: 10 %
Neutro Abs: 1750 cells/uL (ref 1500–7800)
Neutrophils Relative %: 40.7 %
Platelets: 238 10*3/uL (ref 140–400)
RBC: 3.57 10*6/uL — ABNORMAL LOW (ref 3.80–5.10)
RDW: 11.7 % (ref 11.0–15.0)
Total Lymphocyte: 41.4 %
WBC: 4.3 10*3/uL (ref 3.8–10.8)

## 2020-02-17 LAB — LIPID PANEL
Cholesterol: 216 mg/dL — ABNORMAL HIGH (ref <200)
HDL: 98 mg/dL (ref >=50)
LDL Cholesterol (Calc): 102 mg/dL (calc) — ABNORMAL HIGH
Non-HDL Cholesterol (Calc): 118 mg/dL (calc) (ref <130)
Total CHOL/HDL Ratio: 2.2 (calc) (ref <5.0)
Triglycerides: 73 mg/dL (ref <150)

## 2020-02-17 LAB — ANA: Anti Nuclear Antibody (ANA): NEGATIVE

## 2020-02-17 LAB — COMPLETE METABOLIC PANEL WITH GFR
AG Ratio: 2 (calc) (ref 1.0–2.5)
ALT: 30 U/L — ABNORMAL HIGH (ref 6–29)
AST: 36 U/L — ABNORMAL HIGH (ref 10–35)
Albumin: 4.1 g/dL (ref 3.6–5.1)
Alkaline phosphatase (APISO): 56 U/L (ref 37–153)
BUN: 20 mg/dL (ref 7–25)
CO2: 28 mmol/L (ref 20–32)
Calcium: 9 mg/dL (ref 8.6–10.4)
Chloride: 105 mmol/L (ref 98–110)
Creat: 0.67 mg/dL (ref 0.50–0.99)
GFR, Est African American: 104 mL/min/{1.73_m2} (ref >=60)
GFR, Est Non African American: 90 mL/min/{1.73_m2} (ref >=60)
Globulin: 2.1 g/dL (calc) (ref 1.9–3.7)
Glucose, Bld: 86 mg/dL (ref 65–99)
Potassium: 4.7 mmol/L (ref 3.5–5.3)
Sodium: 139 mmol/L (ref 135–146)
Total Bilirubin: 0.5 mg/dL (ref 0.2–1.2)
Total Protein: 6.2 g/dL (ref 6.1–8.1)

## 2020-02-17 LAB — VITAMIN D 25 HYDROXY (VIT D DEFICIENCY, FRACTURES): Vit D, 25-Hydroxy: 63 ng/mL (ref 30–100)

## 2020-02-17 LAB — RHEUMATOID FACTOR: Rheumatoid fact SerPl-aCnc: <14 IU/mL (ref <14)

## 2020-02-17 LAB — ANTI-DNA ANTIBODY, DOUBLE-STRANDED: ds DNA Ab: <1 IU/mL

## 2020-02-17 LAB — TSH: TSH: 1.51 mIU/L (ref 0.40–4.50)

## 2020-02-17 LAB — MAGNESIUM: Magnesium: 2.1 mg/dL (ref 1.5–2.5)

## 2020-02-17 NOTE — Telephone Encounter (Signed)
error 

## 2020-02-18 ENCOUNTER — Encounter: Payer: Self-pay | Admitting: Physician Assistant

## 2020-02-18 DIAGNOSIS — R7989 Other specified abnormal findings of blood chemistry: Secondary | ICD-10-CM | POA: Insufficient documentation

## 2020-03-04 ENCOUNTER — Other Ambulatory Visit: Payer: Self-pay | Admitting: Internal Medicine

## 2020-03-04 DIAGNOSIS — M1990 Unspecified osteoarthritis, unspecified site: Secondary | ICD-10-CM

## 2020-03-04 DIAGNOSIS — F419 Anxiety disorder, unspecified: Secondary | ICD-10-CM

## 2020-03-04 DIAGNOSIS — E039 Hypothyroidism, unspecified: Secondary | ICD-10-CM | POA: Insufficient documentation

## 2020-03-04 DIAGNOSIS — F325 Major depressive disorder, single episode, in full remission: Secondary | ICD-10-CM

## 2020-03-04 MED ORDER — LEVOTHYROXINE SODIUM 50 MCG PO TABS: ORAL_TABLET | ORAL | 1 refills | Status: DC

## 2020-03-04 MED ORDER — ESCITALOPRAM OXALATE 20 MG PO TABS: ORAL_TABLET | ORAL | 0 refills | Status: DC

## 2020-03-04 MED ORDER — CYCLOBENZAPRINE HCL 10 MG PO TABS: ORAL_TABLET | ORAL | 0 refills | Status: DC

## 2020-03-04 MED ORDER — LORAZEPAM 2 MG PO TABS: ORAL_TABLET | ORAL | 0 refills | Status: DC

## 2020-03-04 MED ORDER — AZELASTINE HCL 0.1 % NA SOLN: NASAL | 0 refills | Status: DC

## 2020-03-21 ENCOUNTER — Other Ambulatory Visit: Payer: Self-pay | Admitting: Internal Medicine

## 2020-03-21 DIAGNOSIS — M1990 Unspecified osteoarthritis, unspecified site: Secondary | ICD-10-CM

## 2020-03-21 MED ORDER — CYCLOBENZAPRINE HCL 10 MG PO TABS: ORAL_TABLET | ORAL | 1 refills | Status: DC

## 2020-03-27 DIAGNOSIS — L821 Other seborrheic keratosis: Secondary | ICD-10-CM | POA: Diagnosis not present

## 2020-03-27 DIAGNOSIS — D225 Melanocytic nevi of trunk: Secondary | ICD-10-CM | POA: Diagnosis not present

## 2020-03-27 DIAGNOSIS — Z419 Encounter for procedure for purposes other than remedying health state, unspecified: Secondary | ICD-10-CM | POA: Diagnosis not present

## 2020-03-27 DIAGNOSIS — L309 Dermatitis, unspecified: Secondary | ICD-10-CM | POA: Diagnosis not present

## 2020-03-27 DIAGNOSIS — D485 Neoplasm of uncertain behavior of skin: Secondary | ICD-10-CM | POA: Diagnosis not present

## 2020-03-27 DIAGNOSIS — L700 Acne vulgaris: Secondary | ICD-10-CM | POA: Diagnosis not present

## 2020-03-27 DIAGNOSIS — Z85828 Personal history of other malignant neoplasm of skin: Secondary | ICD-10-CM | POA: Diagnosis not present

## 2020-03-27 DIAGNOSIS — D0471 Carcinoma in situ of skin of right lower limb, including hip: Secondary | ICD-10-CM | POA: Diagnosis not present

## 2020-03-27 DIAGNOSIS — Z85820 Personal history of malignant melanoma of skin: Secondary | ICD-10-CM | POA: Diagnosis not present

## 2020-05-03 ENCOUNTER — Other Ambulatory Visit: Payer: Self-pay | Admitting: Physician Assistant

## 2020-05-03 DIAGNOSIS — Z1231 Encounter for screening mammogram for malignant neoplasm of breast: Secondary | ICD-10-CM

## 2020-05-30 ENCOUNTER — Ambulatory Visit
Admission: RE | Admit: 2020-05-30 | Discharge: 2020-05-30 | Disposition: A | Discharge status: Home / Self Care | Payer: PPO | Source: Ambulatory Visit | Attending: Physician Assistant | Admitting: Physician Assistant

## 2020-05-30 ENCOUNTER — Other Ambulatory Visit: Payer: Self-pay

## 2020-05-30 DIAGNOSIS — Z1231 Encounter for screening mammogram for malignant neoplasm of breast: Secondary | ICD-10-CM | POA: Diagnosis not present

## 2020-05-31 ENCOUNTER — Other Ambulatory Visit: Payer: Self-pay | Admitting: Internal Medicine

## 2020-05-31 DIAGNOSIS — M1990 Unspecified osteoarthritis, unspecified site: Secondary | ICD-10-CM

## 2020-05-31 DIAGNOSIS — F325 Major depressive disorder, single episode, in full remission: Secondary | ICD-10-CM

## 2020-06-13 DIAGNOSIS — H401121 Primary open-angle glaucoma, left eye, mild stage: Secondary | ICD-10-CM | POA: Diagnosis not present

## 2020-06-15 ENCOUNTER — Other Ambulatory Visit: Payer: Self-pay | Admitting: Physician Assistant

## 2020-06-15 DIAGNOSIS — F325 Major depressive disorder, single episode, in full remission: Secondary | ICD-10-CM

## 2020-06-16 DIAGNOSIS — H2513 Age-related nuclear cataract, bilateral: Secondary | ICD-10-CM | POA: Diagnosis not present

## 2020-06-16 DIAGNOSIS — H401123 Primary open-angle glaucoma, left eye, severe stage: Secondary | ICD-10-CM | POA: Diagnosis not present

## 2020-06-16 DIAGNOSIS — H35372 Puckering of macula, left eye: Secondary | ICD-10-CM | POA: Diagnosis not present

## 2020-06-18 ENCOUNTER — Other Ambulatory Visit: Payer: Self-pay | Admitting: Internal Medicine

## 2020-06-18 DIAGNOSIS — F419 Anxiety disorder, unspecified: Secondary | ICD-10-CM

## 2020-06-23 DIAGNOSIS — H25812 Combined forms of age-related cataract, left eye: Secondary | ICD-10-CM | POA: Diagnosis not present

## 2020-06-27 DIAGNOSIS — L300 Nummular dermatitis: Secondary | ICD-10-CM | POA: Diagnosis not present

## 2020-06-27 DIAGNOSIS — D225 Melanocytic nevi of trunk: Secondary | ICD-10-CM | POA: Diagnosis not present

## 2020-06-27 DIAGNOSIS — L570 Actinic keratosis: Secondary | ICD-10-CM | POA: Diagnosis not present

## 2020-06-27 DIAGNOSIS — D485 Neoplasm of uncertain behavior of skin: Secondary | ICD-10-CM | POA: Diagnosis not present

## 2020-06-27 DIAGNOSIS — Z419 Encounter for procedure for purposes other than remedying health state, unspecified: Secondary | ICD-10-CM | POA: Diagnosis not present

## 2020-06-27 DIAGNOSIS — C44722 Squamous cell carcinoma of skin of right lower limb, including hip: Secondary | ICD-10-CM | POA: Diagnosis not present

## 2020-06-27 DIAGNOSIS — L821 Other seborrheic keratosis: Secondary | ICD-10-CM | POA: Diagnosis not present

## 2020-06-27 DIAGNOSIS — Z85828 Personal history of other malignant neoplasm of skin: Secondary | ICD-10-CM | POA: Diagnosis not present

## 2020-06-27 DIAGNOSIS — D692 Other nonthrombocytopenic purpura: Secondary | ICD-10-CM | POA: Diagnosis not present

## 2020-06-27 DIAGNOSIS — Z85820 Personal history of malignant melanoma of skin: Secondary | ICD-10-CM | POA: Diagnosis not present

## 2020-07-10 DIAGNOSIS — H2512 Age-related nuclear cataract, left eye: Secondary | ICD-10-CM | POA: Diagnosis not present

## 2020-07-10 DIAGNOSIS — H401123 Primary open-angle glaucoma, left eye, severe stage: Secondary | ICD-10-CM | POA: Diagnosis not present

## 2020-07-10 DIAGNOSIS — H25812 Combined forms of age-related cataract, left eye: Secondary | ICD-10-CM | POA: Diagnosis not present

## 2020-07-10 DIAGNOSIS — Z01818 Encounter for other preprocedural examination: Secondary | ICD-10-CM | POA: Diagnosis not present

## 2020-07-11 ENCOUNTER — Ambulatory Visit: Payer: PPO | Attending: Internal Medicine

## 2020-07-11 DIAGNOSIS — Z23 Encounter for immunization: Secondary | ICD-10-CM

## 2020-07-11 NOTE — Progress Notes (Signed)
   Covid-[facility] Vaccination [facility]  Name:  Breanna Sparks    MRN: [id number] DOB: Aug 23, 1951  07/11/2020  Ms. Breanna Sparks was observed post Covid-19 immunization for 15 minutes without incident. She was provided with Vaccine Information Sheet and instruction to access the V-Safe system.   Ms. Breanna Sparks was instructed to call 911 with any severe reactions post vaccine: Marland Kitchen Difficulty breathing  . Swelling of face and throat  . A fast heartbeat  . A bad rash all over body  . Dizziness and weakness

## 2020-07-24 DIAGNOSIS — H401111 Primary open-angle glaucoma, right eye, mild stage: Secondary | ICD-10-CM | POA: Diagnosis not present

## 2020-07-24 DIAGNOSIS — H25811 Combined forms of age-related cataract, right eye: Secondary | ICD-10-CM | POA: Diagnosis not present

## 2020-07-24 DIAGNOSIS — H409 Unspecified glaucoma: Secondary | ICD-10-CM | POA: Diagnosis not present

## 2020-07-24 DIAGNOSIS — H2511 Age-related nuclear cataract, right eye: Secondary | ICD-10-CM | POA: Diagnosis not present

## 2020-08-11 ENCOUNTER — Other Ambulatory Visit: Payer: Self-pay | Admitting: Adult Health

## 2020-08-11 ENCOUNTER — Other Ambulatory Visit: Payer: Self-pay | Admitting: Internal Medicine

## 2020-08-11 DIAGNOSIS — M1990 Unspecified osteoarthritis, unspecified site: Secondary | ICD-10-CM

## 2020-08-11 DIAGNOSIS — E039 Hypothyroidism, unspecified: Secondary | ICD-10-CM

## 2020-08-11 DIAGNOSIS — K219 Gastro-esophageal reflux disease without esophagitis: Secondary | ICD-10-CM

## 2020-08-24 ENCOUNTER — Encounter: Payer: Self-pay | Admitting: Adult Health Nurse Practitioner

## 2020-08-24 ENCOUNTER — Other Ambulatory Visit: Payer: Self-pay

## 2020-08-24 ENCOUNTER — Ambulatory Visit (INDEPENDENT_AMBULATORY_CARE_PROVIDER_SITE_OTHER): Payer: PPO | Admitting: Adult Health Nurse Practitioner

## 2020-08-24 VITALS — BP 120/72 | HR 78 | Temp 97.5°F | Wt 99.8 lb

## 2020-08-24 DIAGNOSIS — M1990 Unspecified osteoarthritis, unspecified site: Secondary | ICD-10-CM | POA: Diagnosis not present

## 2020-08-24 DIAGNOSIS — F419 Anxiety disorder, unspecified: Secondary | ICD-10-CM

## 2020-08-24 DIAGNOSIS — F325 Major depressive disorder, single episode, in full remission: Secondary | ICD-10-CM | POA: Diagnosis not present

## 2020-08-24 DIAGNOSIS — E039 Hypothyroidism, unspecified: Secondary | ICD-10-CM | POA: Diagnosis not present

## 2020-08-24 DIAGNOSIS — G4700 Insomnia, unspecified: Secondary | ICD-10-CM | POA: Diagnosis not present

## 2020-08-24 DIAGNOSIS — J449 Chronic obstructive pulmonary disease, unspecified: Secondary | ICD-10-CM | POA: Diagnosis not present

## 2020-08-24 DIAGNOSIS — K219 Gastro-esophageal reflux disease without esophagitis: Secondary | ICD-10-CM | POA: Diagnosis not present

## 2020-08-24 DIAGNOSIS — E559 Vitamin D deficiency, unspecified: Secondary | ICD-10-CM

## 2020-08-24 DIAGNOSIS — E785 Hyperlipidemia, unspecified: Secondary | ICD-10-CM | POA: Diagnosis not present

## 2020-08-24 DIAGNOSIS — Z79899 Other long term (current) drug therapy: Secondary | ICD-10-CM | POA: Diagnosis not present

## 2020-08-24 MED ORDER — FAMOTIDINE 20 MG PO TABS: ORAL_TABLET | ORAL | 1 refills | Status: DC

## 2020-08-24 NOTE — Progress Notes (Signed)
FOLLOW UP 3 MONTH  Assessment:   Breanna Sparks was seen today for follow-up.  Diagnoses and all orders for this visit:  Hypothyroidism, unspecified type Taking levothyroxine 50 mcg daily Reminder to take on an empty stomach 30-18mins before first meal of the day. No antacid medications for 4 hours. -     TSH  Hyperlipidemia, unspecified hyperlipidemia type Lifestyle controlled Discussed dietary and exercise modifications Low fat diet -     Lipid panel  Depression, major, in remission (HCC) Continue medications: escitalopram 20mg  Discussed stress management techniques  Discussed, increase water,intake & good sleep hygiene  Discussed increasing exercise & vegetables in diet  Anxiety Doing well on current regiment Discussed stress management techniques   Discussed good sleep hygiene Discussed increasing physical activity and exercise Increase water intake  Chronic obstructive pulmonary disease, unspecified COPD type ([place]) Noted xray 08/2017 No mediations Continue to monitor  Gastroesophageal reflux disease without esophagitis -     famotidine (PEPCID) 20 MG tablet; Take one tablet twice a day for acid reflux.  Insomnia, unspecified type Discussed good sleep hygiene, decrease stimulation prior to sleep Increase day time activity Avoid caffeine in evenings  Arthritis Using Advil 200mg  PRN Doing well at this time  Vitamin D deficiency Continue supplementation to maintain goal of 70-100 Taking Vitamin D 1,000IU IU daily Defer vitamin D level   Medication management Continued     Discussed med's effects and SE's. Screening labs and tests as requested with regular follow-up as recommended.  Further disposition pending results if labs check today. Discussed med's effects and SE's.   Over 30 minutes of face to face interview, exam, counseling, chart review, and critical decision making was performed.    No future appointments.    Subjective:    HPI 69 y.o.  female  presents for 3 month follow up for Hypothyroidism, HLD, depression, anxiety, vitamin D def and weight.    She recently had cataract surgery, bilaterally.  She reports severe glaucoma on left. She recently had a follow up and next one will be in Feburary 2022.  She now is using readers and adjusting to this. Working out 3-4 days a week. She has not been eating healthy lately.   She does have a history of blood clot provoked by surgery and mother with history of blood clot.    She has had allergies, on astelin as needed. Her blood pressure has been controlled at home, today their BP is BP: 120/72 She denies chest pain, shortness of breath, dizziness.   BMI is Body mass index is 16.11 kg/m., she is working on diet and exercise. She has started to work out again and feels she is gaining muscle and gaining weight.  Wt Readings from Last 3 Encounters:  08/24/20 99 lb 12.8 oz (45.3 kg)  02/16/20 101 lb (45.8 kg)  08/17/19 97 lb 9.6 oz (44.3 kg)   Her cholesterol is diet controlled.  Her cholesterol is controlled. The cholesterol last visit was:   Lab Results  Component Value Date   CHOL 216 (H) 02/16/2020   HDL 98 02/16/2020   LDLCALC 102 (H) 02/16/2020   TRIG 73 02/16/2020   CHOLHDL 2.2 02/16/2020   Patient is on Vitamin D supplement.  Lab Results  Component Value Date   VD25OH 63 02/16/2020   She has had a normal A1C.  Lab Results  Component Value Date   HGBA1C 5.1 11/16/2013   History of SCC and melanoma last OV, follows with DERM q 3 months.  She has glacoma and follows with her eye doctor every 6 months.  Patient is on lexapro 20 and states she is doing well with it.  Takes ativan at night for sleep.  She is on estrogen/progresterone cream, gets a combo from [place].  She has history of elevated AST/ALT, negative hepatitis 05/2014.  Lab Results  Component Value Date   ALT 30 (H) 02/16/2020   AST 36 (H) 02/16/2020   ALKPHOS 48 01/21/2017   BILITOT 0.5 02/16/2020    She is on thyroid medication. Her medication was not changed last visit.   Lab Results  Component Value Date   TSH 1.51 02/16/2020  .   Current Medications:   Current Outpatient Medications (Endocrine & Metabolic):  .  levothyroxine (SYNTHROID) 50 MCG tablet, TAKE 1 TABLET DAILY ON AN EMPTY STOMACH WITH WATER ONLY FOR 30 MIN-AVOID ANTACIDS, VITAMINS (CALCIUM & MAGNESIUM), BIOTIN FOR 4 HOURS   Current Outpatient Medications (Respiratory):  .  azelastine (ASTELIN) 0.1 % nasal spray, Use 1 to 2 sprays to each Nostril    1 to 2 x /day as Needed  Current Outpatient Medications (Analgesics):  .  ibuprofen (ADVIL,MOTRIN) 200 MG tablet, Take 200 mg by mouth every 6 (six) hours as needed.  Current Outpatient Medications (Hematological):  Marland Kitchen  Cyanocobalamin (VITAMIN B-12 PO), Take by mouth daily.  Current Outpatient Medications (Other):  Marland Kitchen  Cholecalciferol (VITAMIN D PO), Take 1,000 Int'l Units by mouth daily. .  cyclobenzaprine (FLEXERIL) 10 MG tablet, TAKE 1/2 TO 1 TABLET 2 TO 3 TIMES DAILY AS NEEDED FOR MUSCLE SPASMS .  dorzolamide-timolol (COSOPT) 22.3-6.8 MG/ML ophthalmic solution, INSTILL 1 DROP INTO BOTH EYES EVERY 12 HOURS .  escitalopram (LEXAPRO) 20 MG tablet, TAKE 1 TABLET BY MOUTH EVERY DAY .  famotidine (PEPCID) 40 MG tablet, TAKE 1 TABLET DAILY FOR HEARTBURN & INDIGESTION .  latanoprost (XALATAN) 0.005 % ophthalmic solution,  .  LORazepam (ATIVAN) 2 MG tablet, Take     1/2 - 1 tablet    at Bedtime     ONLY       if needed for Sleep &  limit to 5 days /week to avoid Addiction & Dementia .  valACYclovir (VALTREX) 1000 MG tablet, Take 1 tablet (1,000 mg total) by mouth 2 (two) times daily.  Immunization History  Administered Date(s) Administered  . Fluad Quad(high Dose 65+) 07/05/2019  . Influenza Split 07/02/2013, 07/06/2014  . Influenza, High Dose Seasonal PF 06/26/2015, 07/11/2016, 07/31/2017, 07/02/2018  . PFIZER SARS-COV-2 Vaccination 11/30/2019, 12/21/2019, 07/11/2020   . Pneumococcal Conjugate-13 07/25/2015  . Pneumococcal Polysaccharide-23 02/03/2014  . Tdap 11/13/2012  . Zoster 11/16/2013  . Zoster Recombinat (Shingrix) 07/05/2019, 09/08/2019   Health Maintenance:  Tetanus: 2014 Pneumovax: 2015 Prevnar 13: 2016 Flu vaccine: 2021 Complete Shingrix: 2020 COVID vaccine- completed  Pap: TAH not needed MGM: 05/30/20 DEXA: 05/26/20 Colonoscopy: 03/2018 EGD: 03/2008   Dr. Nicki Reaper eye doctor 2021 q 3-6 months for glaucoma Dr. Jacelyn Grip Dentist every 6 months  Patient Care Team: Unk Pinto, MD as PCP - General (Internal Medicine) Lafayette Dragon, MD (Inactive) as Consulting Physician (Gastroenterology) Macarthur Critchley, [place] as Referring Physician (Optometry) Martinique, Amy, MD as Consulting Physician (Dermatology) Kristeen Miss, MD as Consulting Physician (Neurosurgery)  Medical History: Patient Active Problem List   Diagnosis Date Noted  . Hypothyroidism, unspecified 03/04/2020  . Elevated liver function tests 02/18/2020  . Mild protein-calorie malnutrition ([place]) 08/17/2019  . Hyperlipidemia 08/16/2019  . Vitamin D deficiency 08/16/2019  . Medication management 08/16/2019  .  COPD (chronic obstructive pulmonary disease) ([place]) 08/08/2017  . Gastroesophageal reflux disease without esophagitis 01/10/2016  . Osteopenia 01/10/2016  . Encounter for Medicare annual wellness exam 07/25/2015  . Lower back pain 01/09/2015  . Insomnia 01/09/2015  . HSV-1 (herpes simplex virus 1) infection 01/09/2015  . Depression, major, in remission ([place])   . Anxiety   . Glaucoma   . Arthritis   . History of squamous cell carcinoma    Allergies Allergies  Allergen Reactions  . Codeine     REACTION: Itch/nausea/vomiting  . Meperidine Hcl     REACTION: itch  . Morphine     REACTION: tachycardia  . Latex Itching and Rash    SURGICAL HISTORY She  has a past surgical history that includes Abdominal hysterectomy; Breast surgery; Cervical fusion (2012); Squamous cell  carcinoma excision; Anterior cruciate ligament repair (Bilateral, 1998); Augmentation mammaplasty (Bilateral); and Colonoscopy (2009). FAMILY HISTORY Her family history includes COPD in her sister; Diabetes in her mother; Glaucoma in her mother; Heart attack in her father; Heart disease in her mother; Hypertension in her mother; Throat cancer in her father. SOCIAL HISTORY She  reports that she has never smoked. She has never used smokeless tobacco. She reports current alcohol use of about 4.0 standard drinks of alcohol per week. She reports that she does not use drugs.    Review of Systems  Constitutional: Negative for chills, fever and weight loss.  HENT: Negative.  Negative for hearing loss and tinnitus.   Eyes: Negative.  Negative for blurred vision and double vision.  Respiratory: Negative.   Cardiovascular: Negative for chest pain, palpitations, orthopnea, claudication, leg swelling and PND.  Gastrointestinal: Negative.  Negative for heartburn, nausea and vomiting.  Genitourinary: Negative for dysuria and urgency.  Musculoskeletal: Negative.   Skin: Negative for itching and rash.  Neurological: Negative.  Negative for dizziness, tingling, tremors and headaches.  Endo/Heme/Allergies: Negative.   Psychiatric/Behavioral: Negative for depression, hallucinations, substance abuse and suicidal ideas. The patient has insomnia. The patient is not nervous/anxious.     Physical Exam: Estimated body mass index is 16.11 kg/m as calculated from the following:   Height as of 02/11/19: 5\' 6"  (1.676 m).   Weight as of this encounter: 99 lb 12.8 oz (45.3 kg). Vitals:   08/24/20 1112  BP: 120/72  Pulse: 78  Temp: (!) 97.5 F (36.4 C)  SpO2: 94%   General Appearance: Well nourished, in no apparent distress. Eyes: PERRLA, EOMs, conjunctiva no swelling or erythema, normal fundi and vessels. Sinuses: No Frontal/maxillary tenderness ENT/Mouth: Ext aud canals clear, normal light reflex with TMs  without erythema, bulging.  Good dentition. No erythema, swelling, or exudate on post pharynx. Tonsils not swollen or erythematous. Hearing normal.  Neck: Supple, thyroid normal. No bruits Respiratory: Respiratory effort normal, BS equal bilaterally without rales, rhonchi, wheezing or stridor. Cardio: RRR without murmurs, rubs or gallops. Brisk peripheral pulses with mild non-pitting bilateral edema, left worse than right  No warmth, no redness, Chest: symmetric, with normal excursions and percussion. Abdomen: Soft, +BS. Non tender, no guarding, rebound, hernias, masses, or organomegaly. .  Lymphatics: Non tender without lymphadenopathy.  Musculoskeletal: Full ROM all peripheral extremities,5/5 strength, and normal gait.  Skin: Warm, dry without rashes, lesions, ecchymosis.  Neuro: Cranial nerves intact, reflexes equal bilaterally. Normal muscle tone, no cerebellar symptoms. Sensation intact.  Psych: Awake and oriented X 3, normal affect, Insight and Judgment appropriate.    Garnet Sierras, Laqueta Jean, DNP [facility] 08/24/2020  11:36  AM

## 2020-08-25 LAB — LIPID PANEL
Cholesterol: 233 mg/dL — ABNORMAL HIGH (ref <200)
HDL: 107 mg/dL (ref >=50)
LDL Cholesterol (Calc): 110 mg/dL (calc) — ABNORMAL HIGH
Non-HDL Cholesterol (Calc): 126 mg/dL (calc) (ref <130)
Total CHOL/HDL Ratio: 2.2 (calc) (ref <5.0)
Triglycerides: 71 mg/dL (ref <150)

## 2020-08-25 LAB — COMPLETE METABOLIC PANEL WITH GFR
AG Ratio: 2.2 (calc) (ref 1.0–2.5)
ALT: 23 U/L (ref 6–29)
AST: 27 U/L (ref 10–35)
Albumin: 4.4 g/dL (ref 3.6–5.1)
Alkaline phosphatase (APISO): 60 U/L (ref 37–153)
BUN: 20 mg/dL (ref 7–25)
CO2: 26 mmol/L (ref 20–32)
Calcium: 9.5 mg/dL (ref 8.6–10.4)
Chloride: 104 mmol/L (ref 98–110)
Creat: 0.69 mg/dL (ref 0.60–0.93)
GFR, Est African American: 102 mL/min/{1.73_m2} (ref >=60)
GFR, Est Non African American: 88 mL/min/{1.73_m2} (ref >=60)
Globulin: 2 g/dL (calc) (ref 1.9–3.7)
Glucose, Bld: 88 mg/dL (ref 65–99)
Potassium: 4.5 mmol/L (ref 3.5–5.3)
Sodium: 139 mmol/L (ref 135–146)
Total Bilirubin: 0.4 mg/dL (ref 0.2–1.2)
Total Protein: 6.4 g/dL (ref 6.1–8.1)

## 2020-08-25 LAB — CBC WITH DIFFERENTIAL/PLATELET
Absolute Monocytes: 405 cells/uL (ref 200–950)
Basophils Absolute: 22 cells/uL (ref 0–200)
Basophils Relative: 0.4 %
Eosinophils Absolute: 427 cells/uL (ref 15–500)
Eosinophils Relative: 7.9 %
HCT: 37.9 % (ref 35.0–45.0)
Hemoglobin: 13.1 g/dL (ref 11.7–15.5)
Lymphs Abs: 2047 cells/uL (ref 850–3900)
MCH: 36.9 pg — ABNORMAL HIGH (ref 27.0–33.0)
MCHC: 34.6 g/dL (ref 32.0–36.0)
MCV: 106.8 fL — ABNORMAL HIGH (ref 80.0–100.0)
MPV: 9.6 fL (ref 7.5–12.5)
Monocytes Relative: 7.5 %
Neutro Abs: 2500 cells/uL (ref 1500–7800)
Neutrophils Relative %: 46.3 %
Platelets: 222 10*3/uL (ref 140–400)
RBC: 3.55 10*6/uL — ABNORMAL LOW (ref 3.80–5.10)
RDW: 11.2 % (ref 11.0–15.0)
Total Lymphocyte: 37.9 %
WBC: 5.4 10*3/uL (ref 3.8–10.8)

## 2020-08-25 LAB — TSH: TSH: 0.98 mIU/L (ref 0.40–4.50)

## 2020-09-14 ENCOUNTER — Other Ambulatory Visit: Payer: Self-pay | Admitting: Adult Health Nurse Practitioner

## 2020-09-14 ENCOUNTER — Other Ambulatory Visit: Payer: Self-pay | Admitting: Internal Medicine

## 2020-09-14 ENCOUNTER — Other Ambulatory Visit: Payer: Self-pay | Admitting: Adult Health

## 2020-09-14 DIAGNOSIS — F419 Anxiety disorder, unspecified: Secondary | ICD-10-CM

## 2020-09-14 DIAGNOSIS — M1990 Unspecified osteoarthritis, unspecified site: Secondary | ICD-10-CM

## 2020-09-20 ENCOUNTER — Other Ambulatory Visit: Payer: Self-pay | Admitting: Adult Health Nurse Practitioner

## 2020-09-20 DIAGNOSIS — B009 Herpesviral infection, unspecified: Secondary | ICD-10-CM

## 2020-09-20 DIAGNOSIS — F419 Anxiety disorder, unspecified: Secondary | ICD-10-CM

## 2020-09-20 MED ORDER — VALACYCLOVIR HCL 1 G PO TABS: 1000.0000 mg | ORAL_TABLET | Freq: Two times a day (BID) | ORAL | 3 refills | Status: DC

## 2020-09-26 DIAGNOSIS — L821 Other seborrheic keratosis: Secondary | ICD-10-CM | POA: Diagnosis not present

## 2020-09-26 DIAGNOSIS — L309 Dermatitis, unspecified: Secondary | ICD-10-CM | POA: Diagnosis not present

## 2020-09-26 DIAGNOSIS — Z85828 Personal history of other malignant neoplasm of skin: Secondary | ICD-10-CM | POA: Diagnosis not present

## 2020-09-26 DIAGNOSIS — Z85820 Personal history of malignant melanoma of skin: Secondary | ICD-10-CM | POA: Diagnosis not present

## 2020-09-26 DIAGNOSIS — L814 Other melanin hyperpigmentation: Secondary | ICD-10-CM | POA: Diagnosis not present

## 2020-09-26 DIAGNOSIS — D225 Melanocytic nevi of trunk: Secondary | ICD-10-CM | POA: Diagnosis not present

## 2020-09-26 DIAGNOSIS — Z419 Encounter for procedure for purposes other than remedying health state, unspecified: Secondary | ICD-10-CM | POA: Diagnosis not present

## 2020-11-10 DIAGNOSIS — H401111 Primary open-angle glaucoma, right eye, mild stage: Secondary | ICD-10-CM | POA: Diagnosis not present

## 2020-12-13 ENCOUNTER — Other Ambulatory Visit: Payer: Self-pay | Admitting: Adult Health Nurse Practitioner

## 2020-12-13 DIAGNOSIS — F419 Anxiety disorder, unspecified: Secondary | ICD-10-CM

## 2020-12-26 DIAGNOSIS — D2272 Melanocytic nevi of left lower limb, including hip: Secondary | ICD-10-CM | POA: Diagnosis not present

## 2020-12-26 DIAGNOSIS — D692 Other nonthrombocytopenic purpura: Secondary | ICD-10-CM | POA: Diagnosis not present

## 2020-12-26 DIAGNOSIS — Z85820 Personal history of malignant melanoma of skin: Secondary | ICD-10-CM | POA: Diagnosis not present

## 2020-12-26 DIAGNOSIS — L570 Actinic keratosis: Secondary | ICD-10-CM | POA: Diagnosis not present

## 2020-12-26 DIAGNOSIS — C44722 Squamous cell carcinoma of skin of right lower limb, including hip: Secondary | ICD-10-CM | POA: Diagnosis not present

## 2020-12-26 DIAGNOSIS — L821 Other seborrheic keratosis: Secondary | ICD-10-CM | POA: Diagnosis not present

## 2020-12-26 DIAGNOSIS — Z85828 Personal history of other malignant neoplasm of skin: Secondary | ICD-10-CM | POA: Diagnosis not present

## 2020-12-26 DIAGNOSIS — D225 Melanocytic nevi of trunk: Secondary | ICD-10-CM | POA: Diagnosis not present

## 2020-12-26 DIAGNOSIS — L820 Inflamed seborrheic keratosis: Secondary | ICD-10-CM | POA: Diagnosis not present

## 2020-12-27 DIAGNOSIS — H401123 Primary open-angle glaucoma, left eye, severe stage: Secondary | ICD-10-CM | POA: Diagnosis not present

## 2020-12-27 DIAGNOSIS — H35372 Puckering of macula, left eye: Secondary | ICD-10-CM | POA: Diagnosis not present

## 2021-01-28 ENCOUNTER — Other Ambulatory Visit: Payer: Self-pay | Admitting: Internal Medicine

## 2021-01-28 ENCOUNTER — Other Ambulatory Visit: Payer: Self-pay | Admitting: Adult Health

## 2021-01-28 DIAGNOSIS — F325 Major depressive disorder, single episode, in full remission: Secondary | ICD-10-CM

## 2021-01-28 DIAGNOSIS — E039 Hypothyroidism, unspecified: Secondary | ICD-10-CM

## 2021-01-28 MED ORDER — ESCITALOPRAM OXALATE 20 MG PO TABS: ORAL_TABLET | ORAL | 1 refills | Status: DC

## 2021-01-31 ENCOUNTER — Other Ambulatory Visit: Payer: Self-pay | Admitting: Adult Health Nurse Practitioner

## 2021-01-31 DIAGNOSIS — M1990 Unspecified osteoarthritis, unspecified site: Secondary | ICD-10-CM

## 2021-02-21 NOTE — Progress Notes (Incomplete)
Future Appointments  Date Time Provider [place]  02/22/2021 11:30 AM Unk Pinto, MD [facility] None    History of Present Illness:       This very nice 70 y.o.female presents for 3 month follow up with HTN, HLD, Pre-Diabetes and Vitamin D Deficiency.        Patient is treated for HTN & BP has been controlled at home. Today's  . Patient has had no complaints of any cardiac type chest pain, palpitations, dyspnea / orthopnea / PND, dizziness, claudication, or dependent edema.       Hyperlipidemia is controlled with diet & meds. Patient denies myalgias or other med SE's. Last Lipids were not at goal:  Lab Results  Component Value Date   CHOL 233 (H) 08/24/2020   HDL 107 08/24/2020   LDLCALC 110 (H) 08/24/2020   TRIG 71 08/24/2020   CHOLHDL 2.2 08/24/2020     Also, the patient has history of PreDiabetes and has had no symptoms of reactive hypoglycemia, diabetic polys, paresthesias or visual blurring.  Last A1c was   Lab Results  Component Value Date   HGBA1C 5.1 11/16/2013        Further, the patient also has history of Vitamin D Deficiency and supplements vitamin D without any suspected side-effects. Last vitamin D was   Lab Results  Component Value Date   VD25OH 63 02/16/2020     Current Outpatient Medications on File Prior to Visit  Medication Sig  . azelastine (ASTELIN) 0.1 % nasal spray Use 1 to 2 sprays to each Nostril    1 to 2 x /day as Needed  . Cholecalciferol (VITAMIN D PO) Take 1,000 Int'l Units by mouth daily.  . Cyanocobalamin (VITAMIN B-12 PO) Take by mouth daily.  . cyclobenzaprine (FLEXERIL) 10 MG tablet TAKE 1/2 TO 1 TABLET 2 TO 3 TIMES DAILY AS NEEDED FOR MUSCLE SPASMS  . dorzolamide-timolol (COSOPT) 22.3-6.8 MG/ML ophthalmic solution INSTILL 1 DROP INTO BOTH EYES EVERY 12 HOURS  . escitalopram (LEXAPRO) 20 MG tablet Take  1 tablet  Daily for  Mood  . famotidine (PEPCID) 20 MG tablet Take one tablet twice a day for acid  reflux.  Marland Kitchen ibuprofen (ADVIL,MOTRIN) 200 MG tablet Take 200 mg by mouth every 6 (six) hours as needed.  . latanoprost (XALATAN) 0.005 % ophthalmic solution   . levothyroxine (SYNTHROID) 50 MCG tablet Take  1 tablet  Daily  on an empty stomach with only water for 30 minutes & no Antacid meds, Calcium or Magnesium for 4 hours & avoid Biotin  . LORazepam (ATIVAN) 2 MG tablet TAKE 1/2 TO 1 TABLET AT BEDTIME ONLY IF NEEDED FOR SLEEP-LIMIT TO 5 DAYS/WEEK TO AVOID ADDICTION  . valACYclovir (VALTREX) 1000 MG tablet Take 1 tablet (1,000 mg total) by mouth 2 (two) times daily.   No current facility-administered medications on file prior to visit.     Allergies  Allergen Reactions  . Codeine     REACTION: Itch/nausea/vomiting  . Meperidine Hcl     REACTION: itch  . Morphine     REACTION: tachycardia  . Latex Itching and Rash     PMHx:   Past Medical History:  Diagnosis Date  . Anxiety   . Arthritis   . Cancer (HCC)    skin cancer-melanoma arm  . Depression   . GERD (gastroesophageal reflux disease)   . Glaucoma   . History of squamous cell carcinoma   . Thyroid disease   .  TMJ (temporomandibular joint syndrome)    wears gaurd at night     Immunization History  Administered Date(s) Administered  . Fluad Quad(high Dose 65+) 07/05/2019  . Influenza Split 07/02/2013, 07/06/2014  . Influenza, High Dose Seasonal PF 06/26/2015, 07/11/2016, 07/31/2017, 07/02/2018  . PFIZER(Purple [place])SARS-COV-2 Vaccination 11/30/2019, 12/21/2019, 07/11/2020  . Pneumococcal Conjugate-13 07/25/2015  . Pneumococcal Polysaccharide-23 02/03/2014  . Tdap 11/13/2012  . Zoster 11/16/2013  . Zoster Recombinat (Shingrix) 07/05/2019, 09/08/2019     Past Surgical History:  Procedure Laterality Date  . ABDOMINAL HYSTERECTOMY     age 46  . ANTERIOR CRUCIATE LIGAMENT REPAIR Bilateral 1998  . AUGMENTATION MAMMAPLASTY Bilateral    saline  . BREAST SURGERY     implants  . CERVICAL FUSION  2012   Dr. Ellene Route   . COLONOSCOPY  2009  . SQUAMOUS CELL CARCINOMA EXCISION     Multiple    FHx:    Reviewed / unchanged  SHx:    Reviewed / unchanged   Systems Review:  Constitutional: Denies fever, chills, wt changes, headaches, insomnia, fatigue, night sweats, change in appetite. Eyes: Denies redness, blurred vision, diplopia, discharge, itchy, watery eyes.  ENT: Denies discharge, congestion, post nasal drip, epistaxis, sore throat, earache, hearing loss, dental pain, tinnitus, vertigo, sinus pain, snoring.  CV: Denies chest pain, palpitations, irregular heartbeat, syncope, dyspnea, diaphoresis, orthopnea, PND, claudication or edema. Respiratory: denies cough, dyspnea, DOE, pleurisy, hoarseness, laryngitis, wheezing.  Gastrointestinal: Denies dysphagia, odynophagia, heartburn, reflux, water brash, abdominal pain or cramps, nausea, vomiting, bloating, diarrhea, constipation, hematemesis, melena, hematochezia  or hemorrhoids. Genitourinary: Denies dysuria, frequency, urgency, nocturia, hesitancy, discharge, hematuria or flank pain. Musculoskeletal: Denies arthralgias, myalgias, stiffness, jt. swelling, pain, limping or strain/sprain.  Skin: Denies pruritus, rash, hives, warts, acne, eczema or change in skin lesion(s). Neuro: No weakness, tremor, incoordination, spasms, paresthesia or pain. Psychiatric: Denies confusion, memory loss or sensory loss. Endo: Denies change in weight, skin or hair change.  Heme/Lymph: No excessive bleeding, bruising or enlarged lymph nodes.  Physical Exam  There were no vitals taken for this visit.  Appears  well nourished, well groomed  and in no distress.  Eyes: PERRLA, EOMs, conjunctiva no swelling or erythema. Sinuses: No frontal/maxillary tenderness ENT/Mouth: EAC's clear, TM's nl w/o erythema, bulging. Nares clear w/o erythema, swelling, exudates. Oropharynx clear without erythema or exudates. Oral hygiene is good. Tongue normal, non obstructing. Hearing intact.   Neck: Supple. Thyroid not palpable. Car 2+/2+ without bruits, nodes or JVD. Chest: Respirations nl with BS clear & equal w/o rales, rhonchi, wheezing or stridor.  Cor: Heart sounds normal w/ regular rate and rhythm without sig. murmurs, gallops, clicks or rubs. Peripheral pulses normal and equal  without edema.  Abdomen: Soft & bowel sounds normal. Non-tender w/o guarding, rebound, hernias, masses or organomegaly.  Lymphatics: Unremarkable.  Musculoskeletal: Full ROM all peripheral extremities, joint stability, 5/5 strength and normal gait.  Skin: Warm, dry without exposed rashes, lesions or ecchymosis apparent.  Neuro: Cranial nerves intact, reflexes equal bilaterally. Sensory-motor testing grossly intact. Tendon reflexes grossly intact.  Pysch: Alert & oriented x 3.  Insight and judgement nl & appropriate. No ideations.  Assessment and Plan:  - Continue medication, monitor blood pressure at home.  - Continue DASH diet.  Reminder to go to the ER if any CP,  SOB, nausea, dizziness, severe HA, changes vision/speech.  - Continue diet/meds, exercise,& lifestyle modifications.  - Continue monitor periodic cholesterol/liver & renal functions    - Continue diet, exercise  - Lifestyle modifications.  -  Monitor appropriate labs. - Continue supplementation.        Discussed  regular exercise, BP monitoring, weight control to achieve/maintain BMI less than 25 and discussed med and SE's. Recommended labs to assess and monitor clinical status with further disposition pending results of labs.  I discussed the assessment and treatment plan with the patient. The patient was provided an opportunity to ask questions and all were answered. The patient agreed with the plan and demonstrated an understanding of the instructions.  I provided over 30 minutes of exam, counseling, chart review and  complex critical decision making.        The patient was advised to call back or seek an in-person evaluation if  the symptoms worsen or if the condition fails to improve as anticipated.   Kirtland Bouchard, MD

## 2021-02-21 NOTE — Progress Notes (Signed)
History of Present Illness:       This very nice 70 y.o. MWF  presents for 3 month follow up with HLD, Pre-Diabetes and Vitamin D Deficiency.        Patient is monitored expectantly for elevated  BP has been controlled at home. Today's BP is at goal - 120/78. Patient has had no complaints of any cardiac type chest pain, palpitations, dyspnea / orthopnea / PND, dizziness, claudication, or dependent edema.       Hyperlipidemia is not controlled with diet & meds. Patient denies myalgias or other med SE's. Last Lipids were not at goal:  Lab Results  Component Value Date   CHOL 233 (H) 08/24/2020   HDL 107 08/24/2020   LDLCALC 110 (H) 08/24/2020   TRIG 71 08/24/2020   CHOLHDL 2.2 08/24/2020     Also, the patient has been monitored expectantly for glucose intolerance  and has had no symptoms of reactive hypoglycemia, diabetic polys, paresthesias or visual blurring.  Last A1c was normal & at goal:  Lab Results  Component Value Date   HGBA1C 5.1 11/16/2013            Further, the patient also has history of Vitamin D Deficiency and supplements vitamin D without any suspected side-effects. Last vitamin D was at goal:   Lab Results  Component Value Date   VD25OH 63 02/16/2020     Current Outpatient Medications on File Prior to Visit  Medication Sig  . ASTELIN 0.1 % nasal spray Use 1 to 2 sprays to each Nostril    1 to 2 x /day   . VITAMIN D   1,000  Units Take daily.  Marland Kitchen VITAMIN B-12 Take  daily.  . cyclobenzaprine  10 MG tablet TAKE 1/2 TO 1 TAB 2 TO 3 TIMES DAILY   . COSOPT  ophth soln I1 DROP INTO BOTH EYES EVERY 12 HRS  . escitalopram  20 MG tablet Take  1 tablet  Daily for  Mood  . famotidine  20 MG tablet Take one tablet twice a day for acid reflux.  Marland Kitchen ibuprofen 200 MG tablet Take every 6  hours as needed.  Marland Kitchen XALATAN 0.005 % ophthsoln   . levothyroxine  50 MCG tablet Take  1 tablet  Daily    . LORazepam 2 MG tablet 1/2 TO 1 TAB  AT BEDTIME  IF NEEDED   .  valACYclovir  1000 MG tablet Take 1 tablet  2 times daily.      Allergies  Allergen Reactions  . Codeine     REACTION: Itch/nausea/vomiting  . Meperidine Hcl     REACTION: itch  . Morphine     REACTION: tachycardia  . Latex Itching and Rash    PMHx:   Past Medical History:  Diagnosis Date  . Anxiety   . Arthritis   . Cancer (HCC)    skin cancer-melanoma arm  . Depression   . GERD (gastroesophageal reflux disease)   . Glaucoma   . History of squamous cell carcinoma   . Thyroid disease   . TMJ (temporomandibular joint syndrome)    wears gaurd at night     Immunization History  Administered Date(s) Administered  . Flu ad Quad 07/05/2019  . Influenza Split 07/02/2013, 07/06/2014  . Influenza, High Dose  , 07/31/2017, 07/02/2018  . PFIZER  SARS-COV-2 Vacc 11/30/2019, 12/21/2019, 07/11/2020  . Pneumococcal -13 07/25/2015  . Pneumococcal -23 02/03/2014  . Tdap 11/13/2012  . Zoster 11/16/2013  .  Zoster Recombinat (Shingrix) 07/05/2019, 09/08/2019     Past Surgical History:  Procedure Laterality Date  . ABDOMINAL HYSTERECTOMY     age 56  . ANTERIOR CRUCIATE LIGAMENT REPAIR Bilateral 1998  . AUGMENTATION MAMMAPLASTY Bilateral    saline  . BREAST SURGERY     implants  . CERVICAL FUSION  2012   Dr. Ellene Route  . COLONOSCOPY  2009  . SQUAMOUS CELL CARCINOMA EXCISION     Multiple    FHx:    Reviewed / unchanged  SHx:    Reviewed / unchanged   Systems Review:  Constitutional: Denies fever, chills, wt changes, headaches, insomnia, fatigue, night sweats, change in appetite. Eyes: Denies redness, blurred vision, diplopia, discharge, itchy, watery eyes.  ENT: Denies discharge, congestion, post nasal drip, epistaxis, sore throat, earache, hearing loss, dental pain, tinnitus, vertigo, sinus pain, snoring.  CV: Denies chest pain, palpitations, irregular heartbeat, syncope, dyspnea, diaphoresis, orthopnea, PND, claudication or edema. Respiratory: denies cough, dyspnea,  DOE, pleurisy, hoarseness, laryngitis, wheezing.  Gastrointestinal: Denies dysphagia, odynophagia, heartburn, reflux, water brash, abdominal pain or cramps, nausea, vomiting, bloating, diarrhea, constipation, hematemesis, melena, hematochezia  or hemorrhoids. Genitourinary: Denies dysuria, frequency, urgency, nocturia, hesitancy, discharge, hematuria or flank pain. Musculoskeletal: Denies arthralgias, myalgias, stiffness, jt. swelling, pain, limping or strain/sprain.  Skin: Denies pruritus, rash, hives, warts, acne, eczema or change in skin lesion(s). Neuro: No weakness, tremor, incoordination, spasms, paresthesia or pain. Psychiatric: Denies confusion, memory loss or sensory loss. Endo: Denies change in weight, skin or hair change.  Heme/Lymph: No excessive bleeding, bruising or enlarged lymph nodes.  Physical Exam  BP 120/78   Pulse 75   Temp 97.7 F (36.5 C)   Resp 16   Ht 5\' 6"  (1.676 m)   Wt 98 lb 12.8 oz (44.8 kg)   SpO2 99%   BMI 15.95 kg/m   Appears  well nourished, well groomed  and in no distress.  Eyes: PERRLA, EOMs, conjunctiva no swelling or erythema. Sinuses: No frontal/maxillary tenderness ENT/Mouth: EAC's clear, TM's nl w/o erythema, bulging. Nares clear w/o erythema, swelling, exudates. Oropharynx clear without erythema or exudates. Oral hygiene is good. Tongue normal, non obstructing. Hearing intact.  Neck: Supple. Thyroid not palpable. Car 2+/2+ without bruits, nodes or JVD. Chest: Respirations nl with BS clear & equal w/o rales, rhonchi, wheezing or stridor.  Cor: Heart sounds normal w/ regular rate and rhythm without sig. murmurs, gallops, clicks or rubs. Peripheral pulses normal and equal  without edema.  Abdomen: Soft & bowel sounds normal. Non-tender w/o guarding, rebound, hernias, masses or organomegaly.  Lymphatics: Unremarkable.  Musculoskeletal: Full ROM all peripheral extremities, joint stability, 5/5 strength and normal gait.  Skin: Warm, dry without  exposed rashes, lesions or ecchymosis apparent.  Neuro: Cranial nerves intact, reflexes equal bilaterally. Sensory-motor testing grossly intact. Tendon reflexes grossly intact.  Pysch: Alert & oriented x 3.  Insight and judgement nl & appropriate. No ideations.  Assessment and Plan:  1. Hypothyroidism  - Continue medication, monitor blood pressure at home.  - Continue DASH diet.  Reminder to go to the ER if any CP,  SOB, nausea, dizziness, severe HA, changes vision/speech.  - TSH  2. Hyperlipidemia, mixed  - Continue diet/meds, exercise,& lifestyle modifications.  - Continue monitor periodic cholesterol/liver & renal functions   - Lipid panel - TSH  3. Abnormal glucose  - Continue diet, exercise  - Lifestyle modifications.  - Monitor appropriate labs  - Hemoglobin A1c - Insulin, random  4. Vitamin D deficiency  -  Continue supplementation.  - VITAMIN D 25 Hydroxy  5. Gastroesophageal reflux disease  - CBC with Differential/Platelet  6. Depression, major(HCC)  - TSH  7. Medication management  - CBC with Differential/Platelet - COMPLETE METABOLIC PANEL WITH GFR - Magnesium - Lipid panel - TSH - Hemoglobin A1c - Insulin, random - VITAMIN D 25 Hydroxy  8. Need for prophylactic vaccination against Streptococcus pneumoniae   - Pneumococcal polysaccharide vaccine 23-valent greater than or equal to 2 yo subcutaneous/IM         Discussed  regular exercise, BP monitoring, weight control to achieve/maintain BMI less than 25 and discussed med and SE's. Recommended labs to assess and monitor clinical status with further disposition pending results of labs.  I discussed the assessment and treatment plan with the patient. The patient was provided an opportunity to ask questions and all were answered. The patient agreed with the plan and demonstrated an understanding of the instructions.  I provided over 30 minutes of exam, counseling, chart review and  complex critical  decision making.        The patient was advised to call back or seek an in-person evaluation if the symptoms worsen or if the condition fails to improve as anticipated.   Kirtland Bouchard, MD

## 2021-02-22 ENCOUNTER — Other Ambulatory Visit: Payer: Self-pay

## 2021-02-22 ENCOUNTER — Ambulatory Visit (INDEPENDENT_AMBULATORY_CARE_PROVIDER_SITE_OTHER): Payer: PPO | Admitting: Internal Medicine

## 2021-02-22 VITALS — BP 120/78 | HR 75 | Temp 97.7°F | Resp 16 | Ht 66.0 in | Wt 98.8 lb

## 2021-02-22 DIAGNOSIS — E039 Hypothyroidism, unspecified: Secondary | ICD-10-CM | POA: Diagnosis not present

## 2021-02-22 DIAGNOSIS — R7309 Other abnormal glucose: Secondary | ICD-10-CM | POA: Diagnosis not present

## 2021-02-22 DIAGNOSIS — Z79899 Other long term (current) drug therapy: Secondary | ICD-10-CM

## 2021-02-22 DIAGNOSIS — Z23 Encounter for immunization: Secondary | ICD-10-CM

## 2021-02-22 DIAGNOSIS — F325 Major depressive disorder, single episode, in full remission: Secondary | ICD-10-CM | POA: Diagnosis not present

## 2021-02-22 DIAGNOSIS — E782 Mixed hyperlipidemia: Secondary | ICD-10-CM | POA: Diagnosis not present

## 2021-02-22 DIAGNOSIS — E559 Vitamin D deficiency, unspecified: Secondary | ICD-10-CM

## 2021-02-22 DIAGNOSIS — K219 Gastro-esophageal reflux disease without esophagitis: Secondary | ICD-10-CM

## 2021-02-22 NOTE — Patient Instructions (Signed)

## 2021-02-23 LAB — CBC WITH DIFFERENTIAL/PLATELET
Absolute Monocytes: 353 cells/uL (ref 200–950)
Basophils Absolute: 22 cells/uL (ref 0–200)
Basophils Relative: 0.5 %
Eosinophils Absolute: 254 cells/uL (ref 15–500)
Eosinophils Relative: 5.9 %
HCT: 38.6 % (ref 35.0–45.0)
Hemoglobin: 13.1 g/dL (ref 11.7–15.5)
Lymphs Abs: 1484 cells/uL (ref 850–3900)
MCH: 37.3 pg — ABNORMAL HIGH (ref 27.0–33.0)
MCHC: 33.9 g/dL (ref 32.0–36.0)
MCV: 110 fL — ABNORMAL HIGH (ref 80.0–100.0)
MPV: 9.5 fL (ref 7.5–12.5)
Monocytes Relative: 8.2 %
Neutro Abs: 2189 cells/uL (ref 1500–7800)
Neutrophils Relative %: 50.9 %
Platelets: 237 10*3/uL (ref 140–400)
RBC: 3.51 10*6/uL — ABNORMAL LOW (ref 3.80–5.10)
RDW: 11.8 % (ref 11.0–15.0)
Total Lymphocyte: 34.5 %
WBC: 4.3 10*3/uL (ref 3.8–10.8)

## 2021-02-23 LAB — COMPLETE METABOLIC PANEL WITH GFR
AG Ratio: 1.9 (calc) (ref 1.0–2.5)
ALT: 29 U/L (ref 6–29)
AST: 30 U/L (ref 10–35)
Albumin: 4.2 g/dL (ref 3.6–5.1)
Alkaline phosphatase (APISO): 51 U/L (ref 37–153)
BUN: 16 mg/dL (ref 7–25)
CO2: 25 mmol/L (ref 20–32)
Calcium: 9 mg/dL (ref 8.6–10.4)
Chloride: 106 mmol/L (ref 98–110)
Creat: 0.66 mg/dL (ref 0.60–0.93)
GFR, Est African American: 104 mL/min/{1.73_m2} (ref >=60)
GFR, Est Non African American: 89 mL/min/{1.73_m2} (ref >=60)
Globulin: 2.2 g/dL (calc) (ref 1.9–3.7)
Glucose, Bld: 89 mg/dL (ref 65–99)
Potassium: 4.2 mmol/L (ref 3.5–5.3)
Sodium: 137 mmol/L (ref 135–146)
Total Bilirubin: 0.6 mg/dL (ref 0.2–1.2)
Total Protein: 6.4 g/dL (ref 6.1–8.1)

## 2021-02-23 LAB — HEMOGLOBIN A1C
Hgb A1c MFr Bld: 4.7 % of total Hgb (ref <5.7)
Mean Plasma Glucose: 88 mg/dL
eAG (mmol/L): 4.9 mmol/L

## 2021-02-23 LAB — LIPID PANEL
Cholesterol: 229 mg/dL — ABNORMAL HIGH (ref <200)
HDL: 111 mg/dL (ref >=50)
LDL Cholesterol (Calc): 102 mg/dL (calc) — ABNORMAL HIGH
Non-HDL Cholesterol (Calc): 118 mg/dL (calc) (ref <130)
Total CHOL/HDL Ratio: 2.1 (calc) (ref <5.0)
Triglycerides: 75 mg/dL (ref <150)

## 2021-02-23 LAB — VITAMIN D 25 HYDROXY (VIT D DEFICIENCY, FRACTURES): Vit D, 25-Hydroxy: 73 ng/mL (ref 30–100)

## 2021-02-23 LAB — MAGNESIUM: Magnesium: 1.9 mg/dL (ref 1.5–2.5)

## 2021-02-23 LAB — TSH: TSH: 1.25 mIU/L (ref 0.40–4.50)

## 2021-02-23 LAB — INSULIN, RANDOM: Insulin: 1.2 u[IU]/mL

## 2021-02-24 ENCOUNTER — Encounter: Payer: Self-pay | Admitting: Internal Medicine

## 2021-02-24 DIAGNOSIS — D7589 Other specified diseases of blood and blood-forming organs: Secondary | ICD-10-CM | POA: Insufficient documentation

## 2021-02-24 NOTE — Progress Notes (Signed)
============================================================ -   Test results slightly outside the reference range are not unusual. If there is anything important, I will review this with you,  otherwise it is considered normal test values.  If you have further questions,  please do not hesitate to contact me at the office or via My Chart.  ============================================================ ============================================================  -  Total Chol = 229 is elevated, but OK since have an                                                          extremely high good protective HDL level  ============================================================ ============================================================  -  A1c - Normal - Great - No Diabetes ============================================================ ============================================================  -  Vitamin D = 73 - Excellent  ============================================================ ============================================================  -  All Else - CBC - Kidneys - Electrolytes - Liver - Magnesium & Thyroid    - all  Normal / OK ============================================================ ============================================================  -  Keep up the [place] Work  ! ============================================================ ============================================================

## 2021-02-25 ENCOUNTER — Encounter: Payer: Self-pay | Admitting: Internal Medicine

## 2021-02-27 NOTE — Progress Notes (Signed)
PATIENT IS AWARE OF LAB RESULTS. [facility]

## 2021-03-08 DIAGNOSIS — H40052 Ocular hypertension, left eye: Secondary | ICD-10-CM | POA: Diagnosis not present

## 2021-03-12 ENCOUNTER — Other Ambulatory Visit: Payer: Self-pay | Admitting: Adult Health

## 2021-03-12 DIAGNOSIS — F419 Anxiety disorder, unspecified: Secondary | ICD-10-CM

## 2021-04-06 DIAGNOSIS — L821 Other seborrheic keratosis: Secondary | ICD-10-CM | POA: Diagnosis not present

## 2021-04-06 DIAGNOSIS — L570 Actinic keratosis: Secondary | ICD-10-CM | POA: Diagnosis not present

## 2021-04-06 DIAGNOSIS — D2272 Melanocytic nevi of left lower limb, including hip: Secondary | ICD-10-CM | POA: Diagnosis not present

## 2021-04-06 DIAGNOSIS — D692 Other nonthrombocytopenic purpura: Secondary | ICD-10-CM | POA: Diagnosis not present

## 2021-04-06 DIAGNOSIS — D225 Melanocytic nevi of trunk: Secondary | ICD-10-CM | POA: Diagnosis not present

## 2021-04-06 DIAGNOSIS — L298 Other pruritus: Secondary | ICD-10-CM | POA: Diagnosis not present

## 2021-04-06 DIAGNOSIS — Z85828 Personal history of other malignant neoplasm of skin: Secondary | ICD-10-CM | POA: Diagnosis not present

## 2021-04-06 DIAGNOSIS — D2271 Melanocytic nevi of right lower limb, including hip: Secondary | ICD-10-CM | POA: Diagnosis not present

## 2021-04-06 DIAGNOSIS — Z85820 Personal history of malignant melanoma of skin: Secondary | ICD-10-CM | POA: Diagnosis not present

## 2021-04-11 ENCOUNTER — Other Ambulatory Visit: Payer: Self-pay

## 2021-04-11 ENCOUNTER — Encounter: Payer: Self-pay | Admitting: Internal Medicine

## 2021-04-11 ENCOUNTER — Ambulatory Visit (INDEPENDENT_AMBULATORY_CARE_PROVIDER_SITE_OTHER): Payer: PPO | Admitting: Internal Medicine

## 2021-04-11 VITALS — BP 114/68 | HR 58 | Temp 97.5°F | Resp 16 | Ht 66.0 in | Wt 95.6 lb

## 2021-04-11 DIAGNOSIS — R0989 Other specified symptoms and signs involving the circulatory and respiratory systems: Secondary | ICD-10-CM | POA: Diagnosis not present

## 2021-04-11 DIAGNOSIS — F419 Anxiety disorder, unspecified: Secondary | ICD-10-CM

## 2021-04-11 DIAGNOSIS — F5101 Primary insomnia: Secondary | ICD-10-CM | POA: Diagnosis not present

## 2021-04-11 MED ORDER — AMITRIPTYLINE HCL 10 MG PO TABS: ORAL_TABLET | ORAL | 1 refills | Status: DC

## 2021-04-11 MED ORDER — LORAZEPAM 0.5 MG PO TABS: ORAL_TABLET | ORAL | 0 refills | Status: DC

## 2021-04-11 NOTE — Progress Notes (Signed)
Future Appointments  Date Time Provider [place]  04/11/2021 11:00 AM Unk Pinto, MD [facility] None  05/31/2021 11:30 AM Liane Comber, NP [facility] None  09/11/2021 10:00 AM Liane Comber, NP [facility] None    History of Present Illness:      This very nice 70 y.o. MWF  with HLD, Pre-Diabetes, Hypothyroidism and Vitamin D Deficiency presents for concern of difficulty tapering off of Lorazepam at hs. Patient reports emotional  /mood lability with attempts at tapering dose of Ativan - now down to 1/ tab ( ~ ~ 0.5 mg) 1-3 x /day. Has been tapering over the last 3 weeks with encouragement & direction from her nurse daughter. She's been occasionally tearful, but denies any SI.      Medications    levothyroxine (SYNTHROID) 50 MCG tablet, Take  1 tablet  Daily     azelastine (ASTELIN) 0.1 % nasal spray, Use 1 to 2 sprays to each Nostril    1 to 2 x /day as Needed   ibuprofen 200 MG tablet, Take 200 mg  every 6 (six) hours as needed   Cyanocobalamin (VITAMIN B-12 PO), Take by mouth daily.   VITAMIN D Take 1,000 Int'l Units \\daily .   cyclobenzaprine 10 MG tablet, TAKE 1/2 TO 1 TA 2 TO 3 TIMES DAILY AS NEEDED    COSOPT)\22.3-6.8 MG/ML ophthalmic solution, INSTILL 1 DROP INTO BOTH EYES EVERY 12 HOURS   escitalopram (LEXAPRO) 20 MG tablet, Take  1 tablet  Daily for  Mood   famotidine (PEPCID) 20 MG tablet, Take one tablet twice a day for acid reflux.   latanoprost (XALATAN) 0.005 % ophthalmic solution   LORazepam (ATIVAN) 2 MG tablet, TAKE 1/2 TO 1 TABLET AT BEDTIME ONLY IF NEEDED FOR SLEEP-LIMIT TO 5 DAYS/WEEK TO AVOID ADDICTION   valACYclovir (VALTREX) 1000 MG tablet, Take 1 tablet (1,000 mg total) by mouth 2 (two) times daily.  Problem list She has Depression, major, in remission ([place]); Anxiety; Glaucoma; Arthritis; History of squamous cell carcinoma; Lower back pain; Insomnia; HSV-1 (herpes simplex virus 1) infection; Gastroesophageal reflux disease without esophagitis;  Osteopenia; COPD (chronic obstructive pulmonary disease) ([place]); Hyperlipidemia; Vitamin D deficiency; Medication management; Mild protein-calorie malnutrition ([place]); Elevated liver function tests; Hypothyroidism, unspecified; and Macrocytosis without anemia (2017)  on their problem list.   Observations/Objective:  BP 114/68   Pulse (!) 58   Temp (!) 97.5 F (36.4 C)   Resp 16   Ht 5\' 6"  (1.676 m)   Wt 95 lb 9.6 oz (43.4 kg)   SpO2 98%   BMI 15.43 kg/m   HEENT - WNL. Neck - supple.  Chest - Clear equal BS. Cor - Nl HS. RRR w/o sig MGR. PP 1(+). No edema. MS- FROM w/o deformities.  Gait Nl. Neuro -  Nl w/o focal abnormalities.   Assessment and Plan:  1. Labile hypertension   2. Primary insomnia   3. Anxiety  - LORazepam (ATIVAN) 0.5 MG tablet; Take  1/2 to 1 tablet  2 x /day (every 12 hours)  as needed for Anxiety  Dispense: 60 tablet  - amitriptyline (ELAVIL) 10 MG tablet; Take  1 tablet  3 x /day  with Meals  Dispense: 90 tablet; Refill: 1  - Discussed taper the lower dose Ativan after 10-14 days  and continue the Amitriptyline   - ROV ~ 3 weeks    Follow Up Instructions:        I discussed the assessment and treatment plan with the patient. The  patient was provided an opportunity to ask questions and all were answered. The patient agreed with the plan and demonstrated an understanding of the instructions.       The patient was advised to call back or seek an in-person evaluation if the symptoms worsen or if the condition fails to improve as anticipated.    Kirtland Bouchard, MD

## 2021-04-16 DIAGNOSIS — H11153 Pinguecula, bilateral: Secondary | ICD-10-CM | POA: Diagnosis not present

## 2021-04-17 ENCOUNTER — Ambulatory Visit: Payer: PPO | Admitting: Medical-Surgical

## 2021-04-23 DIAGNOSIS — Z85828 Personal history of other malignant neoplasm of skin: Secondary | ICD-10-CM | POA: Diagnosis not present

## 2021-04-23 DIAGNOSIS — L0889 Other specified local infections of the skin and subcutaneous tissue: Secondary | ICD-10-CM | POA: Diagnosis not present

## 2021-04-23 DIAGNOSIS — L309 Dermatitis, unspecified: Secondary | ICD-10-CM | POA: Diagnosis not present

## 2021-04-23 DIAGNOSIS — Z85820 Personal history of malignant melanoma of skin: Secondary | ICD-10-CM | POA: Diagnosis not present

## 2021-04-24 ENCOUNTER — Telehealth: Payer: Self-pay | Admitting: Internal Medicine

## 2021-04-24 NOTE — Telephone Encounter (Signed)
Patient called to request a 2nd opinion referral to dermatology. Patient has been evaluated in our office and self referred to dermatology, with no resolution to c/o red rash on legs. I returned patient's call regarding a 2nd opinion referral to dermatoloy for rash on legs. Per Dr. Unk Pinto referral placed to [place] left voicemail for patient with [facility] contact information and to call with any questions.

## 2021-05-01 NOTE — Progress Notes (Addendum)
Future Appointments  Date Time Provider [place]  05/02/2021 10:00 AM Unk Pinto, MD [facility] None  05/08/2021 11:30 AM Unk Pinto, MD [facility] None  05/31/2021 11:30 AM Liane Comber, NP [facility] None  09/11/2021 10:00 AM Liane Comber, NP [facility] None    History of Present Illness:     This very nice 70 y.o. MWF  with HLD, Pre-Diabetes, Hypothyroidism and Vitamin D Deficiency presents with concern of a red spotted rash of her lower extremities. She self-referred to dermatologist for evaluation & patient was upset that the dermatologist could not see her rash and she returns today . Patient describes what sounds like hives coming & going & migratory.                                         She's recently been tapered off low dose Lorazepam transitioning to low dose Amitriptyline.  Medications    levothyroxine 50 MCG tablet, Take  1 tablet  Daily     azelastine 0.1 % nasal spray, Use 1-2 sprays each Nostril 1-2 x /day as Needed   ibuprofen 200 MG tablet, Take every 6 hours as needed.   VITAMIN B-12 tab, Take  daily.   amitriptyline 10 MG tablet, Take  1 tablet  3 x /day    VITAMIN D  1,000 Units;  Take daily.   cyclobenzaprine  10 MG tablet, TAKE 1/2 TO 1 TABLET 2 TO 3 TIMES DAILY AS NEEDED  COSOPT   , INSTILL 1 DROP INTO BOTH EYES EVERY 12 HRS   escitalopram 20 MG tablet, Take  1 tablet  Daily for  Mood   famotidine 20 MG tablet, Take one tablet twice a day for acid reflux.   XALATAN ophth soln,  VALTREX 1000 MG tablet, Take 1 tablet 2 times daily.  Problem list She has Depression, major, in remission ([place]); Anxiety; Glaucoma; Arthritis; History of squamous cell carcinoma; Lower back pain; Insomnia; HSV-1 (herpes simplex virus 1) infection; Gastroesophageal reflux disease without esophagitis; Osteopenia; COPD (chronic obstructive pulmonary disease) ([place]); Hyperlipidemia; Vitamin D deficiency; Medication management; Mild protein-calorie malnutrition  ([place]); Elevated liver function tests; Hypothyroidism, unspecified; and Macrocytosis without anemia (2017)  on their problem list.   Observations/Objective:  BP 126/70   Pulse 85   Temp (!) 97.5 F (36.4 C)   Resp 16   Ht '5\' 6"'$  (1.676 m)   Wt 96 lb 6.4 oz (43.7 kg)   SpO2 99%   BMI 15.56 kg/m   HEENT - WNL. Neck - supple.   Chest - Clear equal BS. Cor - Nl HS. RRR w/o sig MGR. PP 1(+). No edema. MS- FROM w/o deformities.  Gait Nl. Neuro -  Nl w/o focal abnormalities. Skin - Few scattered excoriated scabbing areas over trunk  & extremities   Assessment and Plan:  1. Rash  - CBC with Differential/Platelet - COMPLETE METABOLIC PANEL WITH GFR  2. Urticaria  - CBC with Differential/Platelet  3. Pruritus  - COMPLETE METABOLIC PANEL WITH GFR  - Decadron 2 mg tid x 10 d / bid x 10 d / qd x 10 d , the qod x 3 weeks  - gabapentin (NEURONTIN) 100 MG capsule;  Take 1 capsule 3 x /day for rash, hives, itching   Disp: 270 capsule  4. Medication management  - CBC with Differential/Platelet - COMPLETE METABOLIC PANEL WITH GFR  Follow Up Instructions:  I discussed the assessment and treatment plan with the patient. The patient was provided an opportunity to ask questions and all were answered. The patient agreed with the plan and demonstrated an understanding of the instructions.       The patient was advised to call back or seek an in-person evaluation if the symptoms worsen or if the condition fails to improve as anticipated.   Kirtland Bouchard, MD

## 2021-05-02 ENCOUNTER — Other Ambulatory Visit: Payer: Self-pay | Admitting: Internal Medicine

## 2021-05-02 ENCOUNTER — Ambulatory Visit (INDEPENDENT_AMBULATORY_CARE_PROVIDER_SITE_OTHER): Payer: PPO | Admitting: Internal Medicine

## 2021-05-02 ENCOUNTER — Encounter: Payer: Self-pay | Admitting: Internal Medicine

## 2021-05-02 ENCOUNTER — Other Ambulatory Visit: Payer: Self-pay

## 2021-05-02 VITALS — BP 126/70 | HR 85 | Temp 97.5°F | Resp 16 | Ht 66.0 in | Wt 96.4 lb

## 2021-05-02 DIAGNOSIS — Z79899 Other long term (current) drug therapy: Secondary | ICD-10-CM | POA: Diagnosis not present

## 2021-05-02 DIAGNOSIS — L509 Urticaria, unspecified: Secondary | ICD-10-CM | POA: Diagnosis not present

## 2021-05-02 DIAGNOSIS — L299 Pruritus, unspecified: Secondary | ICD-10-CM

## 2021-05-02 DIAGNOSIS — R21 Rash and other nonspecific skin eruption: Secondary | ICD-10-CM

## 2021-05-02 LAB — CBC WITH DIFFERENTIAL/PLATELET
Absolute Monocytes: 420 cells/uL (ref 200–950)
Basophils Absolute: 20 cells/uL (ref 0–200)
Basophils Relative: 0.4 %
Eosinophils Absolute: 180 cells/uL (ref 15–500)
Eosinophils Relative: 3.6 %
HCT: 38.1 % (ref 35.0–45.0)
Hemoglobin: 12.7 g/dL (ref 11.7–15.5)
Lymphs Abs: 1455 cells/uL (ref 850–3900)
MCH: 36.6 pg — ABNORMAL HIGH (ref 27.0–33.0)
MCHC: 33.3 g/dL (ref 32.0–36.0)
MCV: 109.8 fL — ABNORMAL HIGH (ref 80.0–100.0)
MPV: 9.3 fL (ref 7.5–12.5)
Monocytes Relative: 8.4 %
Neutro Abs: 2925 cells/uL (ref 1500–7800)
Neutrophils Relative %: 58.5 %
Platelets: 241 10*3/uL (ref 140–400)
RBC: 3.47 10*6/uL — ABNORMAL LOW (ref 3.80–5.10)
RDW: 11.7 % (ref 11.0–15.0)
Total Lymphocyte: 29.1 %
WBC: 5 10*3/uL (ref 3.8–10.8)

## 2021-05-02 LAB — COMPLETE METABOLIC PANEL WITH GFR
AG Ratio: 1.8 (calc) (ref 1.0–2.5)
ALT: 27 U/L (ref 6–29)
AST: 28 U/L (ref 10–35)
Albumin: 4.2 g/dL (ref 3.6–5.1)
Alkaline phosphatase (APISO): 56 U/L (ref 37–153)
BUN: 21 mg/dL (ref 7–25)
CO2: 25 mmol/L (ref 20–32)
Calcium: 9 mg/dL (ref 8.6–10.4)
Chloride: 105 mmol/L (ref 98–110)
Creat: 0.69 mg/dL (ref 0.60–1.00)
Globulin: 2.4 g/dL (calc) (ref 1.9–3.7)
Glucose, Bld: 90 mg/dL (ref 65–99)
Potassium: 4.4 mmol/L (ref 3.5–5.3)
Sodium: 136 mmol/L (ref 135–146)
Total Bilirubin: 0.4 mg/dL (ref 0.2–1.2)
Total Protein: 6.6 g/dL (ref 6.1–8.1)
eGFR: 93 mL/min/{1.73_m2} (ref >=60)

## 2021-05-02 MED ORDER — DEXAMETHASONE 2 MG PO TABS: ORAL_TABLET | ORAL | 0 refills | Status: DC

## 2021-05-02 MED ORDER — GABAPENTIN 100 MG PO CAPS: ORAL_CAPSULE | ORAL | 0 refills | Status: DC

## 2021-05-03 ENCOUNTER — Other Ambulatory Visit: Payer: Self-pay | Admitting: Internal Medicine

## 2021-05-03 DIAGNOSIS — F419 Anxiety disorder, unspecified: Secondary | ICD-10-CM

## 2021-05-04 ENCOUNTER — Telehealth: Payer: Self-pay | Admitting: Internal Medicine

## 2021-05-04 NOTE — Telephone Encounter (Signed)
Patient called, please call with lab results, my chart not set up, computer not working

## 2021-05-04 NOTE — Progress Notes (Signed)
============================================================ -   Test results slightly outside the reference range are not unusual. If there is anything important, I will review this with you,  otherwise it is considered normal test values.  If you have further questions,  please do not hesitate to contact me at the office or via My Chart.  ============================================================ ============================================================  -  CBC is "OK" , but has very large Red blood cells which is very suspicious for    a deficiency of Vitamin B12 or Vitamin B19 ( which is Folate)   Suggest take a multivitamin supplement an also take Vitamin B12  and a "Super B supplement" ============================================================ ============================================================

## 2021-05-04 NOTE — Telephone Encounter (Signed)
Patient aware of lab results.

## 2021-05-05 ENCOUNTER — Encounter: Payer: Self-pay | Admitting: Internal Medicine

## 2021-05-08 ENCOUNTER — Ambulatory Visit: Payer: PPO | Admitting: Internal Medicine

## 2021-05-14 DIAGNOSIS — D692 Other nonthrombocytopenic purpura: Secondary | ICD-10-CM | POA: Diagnosis not present

## 2021-05-14 DIAGNOSIS — L565 Disseminated superficial actinic porokeratosis (DSAP): Secondary | ICD-10-CM | POA: Diagnosis not present

## 2021-05-14 DIAGNOSIS — L309 Dermatitis, unspecified: Secondary | ICD-10-CM | POA: Diagnosis not present

## 2021-05-14 DIAGNOSIS — Z85820 Personal history of malignant melanoma of skin: Secondary | ICD-10-CM | POA: Diagnosis not present

## 2021-05-14 DIAGNOSIS — Z85828 Personal history of other malignant neoplasm of skin: Secondary | ICD-10-CM | POA: Diagnosis not present

## 2021-05-17 ENCOUNTER — Telehealth: Payer: Self-pay

## 2021-05-17 NOTE — Telephone Encounter (Signed)
The patient called to report that she was having swelling in her ankles and unable to put flip flops on. She reports that she is on day 10 of her steroid treatment. The patient also reported worsening vision, some swelling in her mouth, tongue, and lip.  She reports that she is not having any issues breathing, swallowing, talking, or eating/drinking at this time. The patient reports she is no longer taking amitriptyline due to it making her feel depressed.   The patient was instructed to go to her local emergency department for evaluation of her current symptoms and to call 911 if her symptoms worsened or she became short of breath, unable to swallow or speak.   The patient voiced understanding to this staff member.

## 2021-05-31 ENCOUNTER — Ambulatory Visit: Payer: PPO | Admitting: Adult Health

## 2021-06-20 ENCOUNTER — Other Ambulatory Visit: Payer: Self-pay

## 2021-06-20 ENCOUNTER — Ambulatory Visit (INDEPENDENT_AMBULATORY_CARE_PROVIDER_SITE_OTHER): Payer: PPO | Admitting: Medical-Surgical

## 2021-06-20 ENCOUNTER — Encounter: Payer: Self-pay | Admitting: Medical-Surgical

## 2021-06-20 ENCOUNTER — Ambulatory Visit (INDEPENDENT_AMBULATORY_CARE_PROVIDER_SITE_OTHER): Payer: PPO

## 2021-06-20 VITALS — BP 145/91 | HR 88 | Resp 20 | Ht 66.0 in | Wt 94.4 lb

## 2021-06-20 DIAGNOSIS — Z7689 Persons encountering health services in other specified circumstances: Secondary | ICD-10-CM

## 2021-06-20 DIAGNOSIS — F5101 Primary insomnia: Secondary | ICD-10-CM

## 2021-06-20 DIAGNOSIS — R634 Abnormal weight loss: Secondary | ICD-10-CM | POA: Insufficient documentation

## 2021-06-20 DIAGNOSIS — F411 Generalized anxiety disorder: Secondary | ICD-10-CM | POA: Diagnosis not present

## 2021-06-20 DIAGNOSIS — R21 Rash and other nonspecific skin eruption: Secondary | ICD-10-CM | POA: Diagnosis not present

## 2021-06-20 DIAGNOSIS — F339 Major depressive disorder, recurrent, unspecified: Secondary | ICD-10-CM | POA: Insufficient documentation

## 2021-06-20 DIAGNOSIS — J439 Emphysema, unspecified: Secondary | ICD-10-CM | POA: Diagnosis not present

## 2021-06-20 MED ORDER — QUETIAPINE FUMARATE 25 MG PO TABS: 25.0000 mg | ORAL_TABLET | Freq: Every day | ORAL | 1 refills | Status: DC

## 2021-06-20 NOTE — Progress Notes (Signed)
New Patient Office Visit  Subjective:  Patient ID: Breanna Sparks, female    DOB: 1950/12/23  Age: 70 y.o. MRN: [id number]  CC: No chief complaint on file.   HPI Breanna Sparks presents to establish care.  She is a very pleasant 70 year old female who presents with quite a few concerns today.  She has some chronic as well as acute complaints that are causing quite an impact on her life.  She is a very active individual to teaches yoga.  Depression/anxiety-has been very bad lately.  She is taking Lexapro 20 mg daily and has been on this for several years.  Tolerates the medication well without side effects.  She has been counseling in the past which was helpful and left her with some good skills for coping with future anxiety.  Unfortunately her depression has been recurrent and is not well managed at this time.  Back in July, she had a bit of a crisis and had some suicidal ideation with that but denies this today.  She has taken Cymbalta in the past with good results but cost was a situation causing her to switch to Lexapro.  Insomnia-has difficulty with sleep maintenance.  Reports being able to go to sleep without difficulty.  She has tried amitriptyline 10 mg nightly but this leaves her extremely depressed and numb.  She was then prescribed lorazepam 2 mg nightly for up to 5 nights per week.  This medicine caused her to be emotionally numb and and she became very concerned.  She has been weaning off of lorazepam and is now down to 0.25 mg every other night.  She does wake frequently, sometimes up to every hour at night and gets very poor rest.  She has not tried any other prescription medications to help with sleep.  Unintentional weight loss over the past couple of years.  Her previous normal weight was in the 1 15-1 20 range but she is weighing in at 94 pounds today.  She is eating more food than she ever has and exercises regularly but does not seem to know where her weight  is going.  She is a non-smoker but had secondhand exposure throughout her childhood due to her parents smoking habits.  She is very concerned due to family history of cancer as well as personal history of skin cancer.  She has a rash that has extended throughout her entire body.  She noticed the rash on June 3 when it started.  She had gone to a car wash stable for and then woke up the next day with bright red, round lesions on her abdomen and under the upper arms.  Lesions had developed white spots in the center.  She has seen both her previous primary care as well as dermatology and so far there have been no answers.  They gave her a prednisone Dosepak but she was unable to tolerate this due to psychiatric side effects.  She does not have any itching but notes that when the antihistamine medications wear off, she has stinging/burning at the site of the lesions.  She also begins to experience some wheezing, palpitations, and decreased balance.  She has not had a skin biopsy for definitive diagnosis.  Reports a home infestation with small, black, sticky bugs.  These started in a small quantity but now have multiplied to affect every aspect of her home.  She is planning to call pest control for advice on eradicating the infestation.  Past Medical History:  Diagnosis Date   Anxiety    Arthritis    Cancer ([place])    skin cancer-melanoma arm   Depression    GERD (gastroesophageal reflux disease)    Glaucoma    History of squamous cell carcinoma    Thyroid disease    TMJ (temporomandibular joint syndrome)    wears gaurd at night    Past Surgical History:  Procedure Laterality Date   ABDOMINAL HYSTERECTOMY     age 1   ANTERIOR CRUCIATE LIGAMENT REPAIR Bilateral 1998   AUGMENTATION MAMMAPLASTY Bilateral    saline   BREAST SURGERY     implants   CERVICAL FUSION  2012   Dr. Ellene Route   COLONOSCOPY  2009   SQUAMOUS CELL CARCINOMA EXCISION     Multiple    Family History  Problem Relation Age  of Onset   Hypertension Mother    Diabetes Mother    Heart disease Mother    Glaucoma Mother    Heart attack Father    Throat cancer Father    COPD Sister    Colon cancer Neg Hx    Esophageal cancer Neg Hx    Rectal cancer Neg Hx    Stomach cancer Neg Hx     Social History   Socioeconomic History   Marital status: Married    Spouse name: Not on file   Number of children: Not on file   Years of education: Not on file   Highest education level: Not on file  Occupational History   Not on file  Tobacco Use   Smoking status: Never   Smokeless tobacco: Never  Vaping Use   Vaping Use: Never used  Substance and Sexual Activity   Alcohol use: Yes    Alcohol/week: 4.0 standard drinks    Types: 4 Standard drinks or equivalent per week   Drug use: No   Sexual activity: Not on file  Other Topics Concern   Not on file  Social History Narrative   Not on file   Social Determinants of Health   Financial Resource Strain: Not on file  Food Insecurity: Not on file  Transportation Needs: Not on file  Physical Activity: Not on file  Stress: Not on file  Social Connections: Not on file  Intimate Partner Violence: Not on file    ROS Review of Systems  Constitutional:  Positive for fatigue and unexpected weight change. Negative for chills and fever.  HENT:  Positive for sore throat and voice change.   Eyes:  Positive for visual disturbance.  Respiratory:  Positive for chest tightness, shortness of breath and wheezing. Negative for cough.   Cardiovascular:  Positive for palpitations and leg swelling. Negative for chest pain.  Endocrine: Positive for heat intolerance and polydipsia. Negative for cold intolerance, polyphagia and polyuria.  Skin:  Positive for rash.  Neurological:  Positive for dizziness, light-headedness and headaches.  Hematological:  Bruises/bleeds easily.  Psychiatric/Behavioral:  Positive for behavioral problems (Mood swings), dysphoric mood and sleep  disturbance. Negative for self-injury and suicidal ideas. The patient is nervous/anxious and is hyperactive.    Objective:   Today's Vitals: BP (!) 145/91 (BP Location: Left Arm, Patient Position: Sitting, Cuff Size: Normal)   Pulse 88   Resp 20   Ht '5\' 6"'$  (1.676 m)   Wt 94 lb 6.4 oz (42.8 kg)   SpO2 96%   BMI 15.24 kg/m   Physical Exam Vitals reviewed.  Constitutional:      General: She is not in acute  distress.    Appearance: Normal appearance.  HENT:     Head: Normocephalic and atraumatic.  Cardiovascular:     Rate and Rhythm: Normal rate and regular rhythm.     Pulses: Normal pulses.     Heart sounds: Normal heart sounds. No murmur heard.   No friction rub. No gallop.  Pulmonary:     Effort: Pulmonary effort is normal. No respiratory distress.     Breath sounds: Normal breath sounds. No wheezing.  Skin:    General: Skin is warm and dry.     Findings: Ecchymosis (Scattered) and rash (Generalized) present. Rash is macular and papular.  Neurological:     Mental Status: She is alert and oriented to person, place, and time.  Psychiatric:        Mood and Affect: Mood normal.        Behavior: Behavior normal.        Thought Content: Thought content normal.        Judgment: Judgment normal.    Assessment & Plan:   1. Encounter to establish care Reviewed available information and discussed care concerns with patient.   2. Unexplained weight loss Recent lab work unremarkable for potential cause of weight loss.  Getting chest x-ray today due to secondhand smoke exposure throughout her childhood.  Suspect this is more related to her high level of anxiety and mood concerns. - DG Chest 2 View; Future  3. Primary insomnia Discontinue amitriptyline.  Discontinue Ativan.  Starting Seroquel 25 mg nightly.  4. Depression, recurrent ([place]) 5. Generalized anxiety disorder Continue Lexapro 20 mg daily.  This may not be the best medication for her but we will change things slowly so  we find a good balance.  6. Rash Evaluated by prior PCP and dermatology.  Unclear etiology.  Recommend return for skin biopsy for definitive diagnosis.  Outpatient Encounter Medications as of 06/20/2021  Medication Sig   dorzolamide-timolol (COSOPT) 22.3-6.8 MG/ML ophthalmic solution INSTILL 1 DROP INTO BOTH EYES EVERY 12 HOURS   escitalopram (LEXAPRO) 20 MG tablet Take  1 tablet  Daily for  Mood   famotidine (PEPCID) 20 MG tablet Take one tablet twice a day for acid reflux.   latanoprost (XALATAN) 0.005 % ophthalmic solution    levothyroxine (SYNTHROID) 50 MCG tablet Take  1 tablet  Daily  on an empty stomach with only water for 30 minutes & no Antacid meds, Calcium or Magnesium for 4 hours & avoid Biotin   QUEtiapine (SEROQUEL) 25 MG tablet Take 1 tablet (25 mg total) by mouth at bedtime.   valACYclovir (VALTREX) 1000 MG tablet Take 1 tablet (1,000 mg total) by mouth 2 (two) times daily.   [DISCONTINUED] amitriptyline (ELAVIL) 10 MG tablet TAKE 1 TABLET 3 TIMES A DAY WITH MEALS   [DISCONTINUED] cyclobenzaprine (FLEXERIL) 10 MG tablet TAKE 1/2 TO 1 TABLET 2 TO 3 TIMES DAILY AS NEEDED FOR MUSCLE SPASMS   [DISCONTINUED] LORazepam (ATIVAN) 0.5 MG tablet Take  1/2 to 1 tablet  2 x /day (every 12 hours)  as needed for Anxiety   [DISCONTINUED] azelastine (ASTELIN) 0.1 % nasal spray Use 1 to 2 sprays to each Nostril    1 to 2 x /day as Needed   [DISCONTINUED] Cholecalciferol (VITAMIN D PO) Take 1,000 Int'l Units by mouth daily.   [DISCONTINUED] Cyanocobalamin (VITAMIN B-12 PO) Take by mouth daily.   [DISCONTINUED] dexamethasone (DECADRON) 2 MG tablet Take 1 tablet 3 x /day for 10 days, then 2 x /day for 10  days, then 1 tablet Daily for 10 days, then 1 tablet every other day for 3 weeks   [DISCONTINUED] gabapentin (NEURONTIN) 100 MG capsule Take 1 capsule 3 x /day for rash, hives, itching   [DISCONTINUED] ibuprofen (ADVIL,MOTRIN) 200 MG tablet Take 200 mg by mouth every 6 (six) hours as needed.   No  facility-administered encounter medications on file as of 06/20/2021.    Follow-up: Return for skin biopsy at your convenience.   Clearnce Sorrel, DNP, APRN, FNP-BC [facility]

## 2021-06-22 ENCOUNTER — Ambulatory Visit (INDEPENDENT_AMBULATORY_CARE_PROVIDER_SITE_OTHER): Payer: PPO | Admitting: Medical-Surgical

## 2021-06-22 ENCOUNTER — Encounter: Payer: Self-pay | Admitting: Medical-Surgical

## 2021-06-22 VITALS — BP 150/82 | HR 92 | Resp 20 | Ht 66.0 in | Wt 94.0 lb

## 2021-06-22 DIAGNOSIS — R21 Rash and other nonspecific skin eruption: Secondary | ICD-10-CM

## 2021-06-22 DIAGNOSIS — F411 Generalized anxiety disorder: Secondary | ICD-10-CM | POA: Diagnosis not present

## 2021-06-22 DIAGNOSIS — F339 Major depressive disorder, recurrent, unspecified: Secondary | ICD-10-CM

## 2021-06-22 DIAGNOSIS — F22 Delusional disorders: Secondary | ICD-10-CM | POA: Insufficient documentation

## 2021-06-22 DIAGNOSIS — F5101 Primary insomnia: Secondary | ICD-10-CM | POA: Diagnosis not present

## 2021-06-22 NOTE — Progress Notes (Signed)
  HPI with pertinent ROS:   CC: Anxiety/rash  HPI: Pleasant 70 year old female accompanied by her soon-to-be daughter-in-law who is also a Designer, jewellery presenting for continued concerns with rash and anxiety.  Notes that her level of anxiety has been severe over the last 48 hours since our last appointment.  She feels like she is going into a mental breakdown and has been inconsolable with frequent panic attacks.  Today, she was pushed over the edge because the exterminator came out and told her that the bugs she has been seeing in her home do not exist.  Her soon-to-be daughter-in-law also verifies that there are no bugs present.  Patient is very worried since she feels that the bugs are real and does not feel like these are hallucinations.  She has started taking Seroquel 25 mg nightly to help with sleep and notes that the last couple of nights, she has actually been able to sleep with only 1 episode of waking.  When she did wake, she was able to fall back to sleep easily.  She is still very worried about the rash that she has all over and is very bothered by the stinging pains.  She has an appointment to come see me on Tuesday for a skin biopsy.  Has significant concerns about the veins around her ankles and heels and how they are bruising/bursting.  She has continued to take her Lexapro 20 mg daily but reports she threw out the amitriptyline and Ativan after our discussion the other day.  With her severity of symptoms, she feels like it is necessary that she talks to someone in psychiatry.  Denies HI.  Endorses feelings of being better off dead several months ago and a history of suicide attempt by overdose many years ago.  No current suicidal ideation or plan for self-harm.  I reviewed the past medical history, family history, social history, surgical history, and allergies today and no changes were needed.  Please see the problem list section below in epic for further details.  Physical exam:    General: Well Developed, poorly nourished.  Speech rapid, agitated and anxious. Neuro: Alert and oriented x3.  HEENT: Normocephalic, atraumatic, pupils equal round reactive to light, neck supple, no masses, no lymphadenopathy, thyroid nonpalpable.  Skin: Warm and dry.  Spider veins noted to bilateral ankles and heel areas.  Lower extremity tan discoloration bilaterally.  Erythematous papules/macules scattered over extremities. Cardiac: Regular rate and rhythm, no murmurs rubs or gallops, no lower extremity edema.  Respiratory: Clear to auscultation bilaterally. Not using accessory muscles, speaking in full sentences.  Impression and Recommendations:    1. Generalized anxiety disorder 2. Depression, recurrent ([place]) 3. Primary insomnia 4. Delusions of parasitosis ([facility]) Continue Lexapro 20 mg daily.  Continue Seroquel 25 mg nightly to help with sleep.  Referring to psychiatry urgently.  Reviewed emergency resources available for psychiatric concerns.  Strongly encouraged her to seek out these resources over the weekend should her anxiety and mental state worsen.  Patient and soon-to-be daughter-in-law agreeable to plan and verbalized understanding.  5. Rash This may be related to her mental health concerns but to be sure, she would like to proceed with skin biopsy on Tuesday so we can definitively rule out any concerns.  Return for Skin biopsy as scheduled on Tuesday. ___________________________________________ Clearnce Sorrel, DNP, APRN, FNP-BC [facility]

## 2021-06-26 ENCOUNTER — Other Ambulatory Visit: Payer: Self-pay

## 2021-06-26 ENCOUNTER — Encounter: Payer: Self-pay | Admitting: Medical-Surgical

## 2021-06-26 ENCOUNTER — Ambulatory Visit (INDEPENDENT_AMBULATORY_CARE_PROVIDER_SITE_OTHER): Payer: PPO | Admitting: Medical-Surgical

## 2021-06-26 VITALS — BP 157/92 | HR 75 | Resp 20 | Ht 66.0 in | Wt 93.6 lb

## 2021-06-26 DIAGNOSIS — L923 Foreign body granuloma of the skin and subcutaneous tissue: Secondary | ICD-10-CM | POA: Diagnosis not present

## 2021-06-26 DIAGNOSIS — R21 Rash and other nonspecific skin eruption: Secondary | ICD-10-CM

## 2021-06-26 DIAGNOSIS — B009 Herpesviral infection, unspecified: Secondary | ICD-10-CM

## 2021-06-26 DIAGNOSIS — Z189 Retained foreign body fragments, unspecified material: Secondary | ICD-10-CM | POA: Diagnosis not present

## 2021-06-26 MED ORDER — VALACYCLOVIR HCL 1 G PO TABS: 1000.0000 mg | ORAL_TABLET | Freq: Two times a day (BID) | ORAL | 3 refills | Status: DC

## 2021-06-26 NOTE — Progress Notes (Signed)
  HPI with pertinent ROS:   CC: skin rash  HPI: Pleasant 70 year old female presenting for skin biopsy to help determine the etiology of her diffuse skin rash.   I reviewed the past medical history, family history, social history, surgical history, and allergies today and no changes were needed.  Please see the problem list section below in epic for further details.   Physical exam:   General: Well Developed, well nourished, and in no acute distress.  Neuro: Alert and oriented x3, extra-ocular muscles intact, sensation grossly intact.  HEENT: Normocephalic, atraumatic.  Skin: Warm and dry.  Diffuse erythematous maculopapular rash over all extremities and torso. Cardiac: Regular rate and rhythm, no murmurs rubs or gallops, no lower extremity edema.  Respiratory: Clear to auscultation bilaterally. Not using accessory muscles, speaking in full sentences.  Impression and Recommendations:    1. Rash After informed consent was obtained, using chlorhexidine for cleansing and 1% Lidocaine without epinephrine for anesthetic, with sterile technique, punch biopsy size 6 mm was performed to the anterior left thigh.  One 3-0 Ethilon suture placed, antibiotic dressing is applied, and wound care instructions provided.  Be alert for any signs of cutaneous infection. The procedure was well tolerated without complications. Follow up: the specimen is labeled and sent to pathology for evaluation, the patient should return in 72 hours for a wound check.  Return in about 3 days (around 06/29/2021) for Wound check. ___________________________________________ Clearnce Sorrel, DNP, APRN, FNP-BC [facility] [place]

## 2021-06-29 ENCOUNTER — Encounter: Payer: Self-pay | Admitting: Medical-Surgical

## 2021-06-29 ENCOUNTER — Other Ambulatory Visit: Payer: Self-pay

## 2021-06-29 ENCOUNTER — Ambulatory Visit (INDEPENDENT_AMBULATORY_CARE_PROVIDER_SITE_OTHER): Payer: PPO | Admitting: Medical-Surgical

## 2021-06-29 VITALS — BP 155/89 | HR 76 | Resp 20 | Ht 66.0 in | Wt 93.3 lb

## 2021-06-29 DIAGNOSIS — F22 Delusional disorders: Secondary | ICD-10-CM | POA: Diagnosis not present

## 2021-06-29 DIAGNOSIS — R634 Abnormal weight loss: Secondary | ICD-10-CM

## 2021-06-29 DIAGNOSIS — Z5189 Encounter for other specified aftercare: Secondary | ICD-10-CM

## 2021-06-29 DIAGNOSIS — F411 Generalized anxiety disorder: Secondary | ICD-10-CM

## 2021-06-29 NOTE — Progress Notes (Signed)
  HPI with pertinent ROS:   CC: Wound check  HPI: Pleasant 70 year old female presenting today for wound check on a left thigh biopsy site.  Notes the site has been healing well with no redness, pain, or drainage.  Has been leaving it open to air to speed healing.  No further weight loss but is still very concerned about the unexplained weight loss that she had been experiencing.  Is open to further investigation.  I reviewed the past medical history, family history, social history, surgical history, and allergies today and no changes were needed.  Please see the problem list section below in epic for further details.   Physical exam:   General: Well Developed, well nourished, and in no acute distress.  Neuro: Alert and oriented x3.  HEENT: Normocephalic, atraumatic.  Skin: Warm and dry.  Left anterior thigh skin biopsy site clean, dry, intact.  Wound edges well approximated with suture intact.  Cardiac: Regular rate and rhythm.  Respiratory: Not using accessory muscles, speaking in full sentences.  Impression and Recommendations:    1. Visit for wound check Wound looks good. No signs of infection. Suture should be removed between 7-10 days (Tuesday through Thursday next week).  2. Delusions of parasitosis ([place]) 3. Generalized anxiety disorder 4. Unexplained weight loss Significant concern for worsening mental health issues and in the setting of unexplained weight loss.  Ordering MRI brain with and without contrast for further evaluation.  Return in about 5 days (around 07/04/2021) for Suture removal. ___________________________________________ Clearnce Sorrel, DNP, APRN, FNP-BC [facility] [place]

## 2021-07-05 ENCOUNTER — Encounter: Payer: Self-pay | Admitting: Family Medicine

## 2021-07-05 ENCOUNTER — Other Ambulatory Visit: Payer: Self-pay

## 2021-07-05 ENCOUNTER — Ambulatory Visit (INDEPENDENT_AMBULATORY_CARE_PROVIDER_SITE_OTHER): Payer: PPO | Admitting: Family Medicine

## 2021-07-05 VITALS — BP 145/85 | HR 73 | Temp 97.7°F | Ht 66.0 in | Wt 97.0 lb

## 2021-07-05 DIAGNOSIS — R21 Rash and other nonspecific skin eruption: Secondary | ICD-10-CM

## 2021-07-05 DIAGNOSIS — Z23 Encounter for immunization: Secondary | ICD-10-CM

## 2021-07-05 MED ORDER — CLOBETASOL PROPIONATE 0.05 % EX CREA: 1.0000 "application " | TOPICAL_CREAM | Freq: Two times a day (BID) | CUTANEOUS | 0 refills | Status: AC

## 2021-07-05 NOTE — Progress Notes (Signed)
Breanna Sparks - 70 y.o. female MRN [id number]  Date of birth: 01/08/1951  Subjective Chief Complaint  Patient presents with   Suture / Staple Removal    HPI [name] is a 69 year old female here today for follow-up of recent punch biopsy.  She needs suture removed.  She has had recurrent rash.  This has been evaluated by dermatology previously without any definitive diagnosis.  Biopsy report from recent punch biopsy shows granuloma consistent with foreign body.  Unclear the cause of this.  She does have some stinging and burning at times outside of the lesions.  She denies itching.  She does report seeing bugs previously in her home.  He has pending referral to psychiatry for increased anxiety and delusional parasitosis.  ROS:  A comprehensive ROS was completed and negative except as noted per HPI  Allergies  Allergen Reactions   Codeine     REACTION: Itch/nausea/vomiting   Meperidine Hcl     REACTION: itch   Morphine     REACTION: tachycardia   Latex Itching and Rash    Past Medical History:  Diagnosis Date   Anxiety    Arthritis    Cancer ([place])    skin cancer-melanoma arm   Depression    GERD (gastroesophageal reflux disease)    Glaucoma    History of squamous cell carcinoma    Thyroid disease    TMJ (temporomandibular joint syndrome)    wears gaurd at night    Past Surgical History:  Procedure Laterality Date   ABDOMINAL HYSTERECTOMY     age 48   ANTERIOR CRUCIATE LIGAMENT REPAIR Bilateral 1998   AUGMENTATION MAMMAPLASTY Bilateral    saline   BREAST SURGERY     implants   CERVICAL FUSION  2012   Dr. Ellene Route   COLONOSCOPY  2009   SQUAMOUS CELL CARCINOMA EXCISION     Multiple    Social History   Socioeconomic History   Marital status: Married    Spouse name: Not on file   Number of children: Not on file   Years of education: Not on file   Highest education level: Not on file  Occupational History   Not on file  Tobacco Use   Smoking  status: Never   Smokeless tobacco: Never  Vaping Use   Vaping Use: Never used  Substance and Sexual Activity   Alcohol use: Yes    Alcohol/week: 4.0 standard drinks    Types: 4 Standard drinks or equivalent per week   Drug use: No   Sexual activity: Not on file  Other Topics Concern   Not on file  Social History Narrative   Not on file   Social Determinants of Health   Financial Resource Strain: Not on file  Food Insecurity: Not on file  Transportation Needs: Not on file  Physical Activity: Not on file  Stress: Not on file  Social Connections: Not on file    Family History  Problem Relation Age of Onset   Hypertension Mother    Diabetes Mother    Heart disease Mother    Glaucoma Mother    Heart attack Father    Throat cancer Father    COPD Sister    Colon cancer Neg Hx    Esophageal cancer Neg Hx    Rectal cancer Neg Hx    Stomach cancer Neg Hx     Health Maintenance  Topic Date Due   COVID-19 Vaccine (4 - Booster for [place] series) 07/12/2021 (Originally 10/03/2020)  MAMMOGRAM  05/30/2022   TETANUS/TDAP  11/13/2022   COLONOSCOPY (Pts 45-48yrs Insurance coverage will need to be confirmed)  06/11/2023   INFLUENZA VACCINE  Completed   DEXA SCAN  Completed   Hepatitis C Screening  Completed   Zoster Vaccines- Shingrix  Completed   HPV VACCINES  Aged Out     ----------------------------------------------------------------------------------------------------------------------------------------------------------------------------------------------------------------- Physical Exam BP (!) 145/85 (BP Location: Left Arm, Patient Position: Sitting, Cuff Size: Small)   Pulse 73   Temp 97.7 F (36.5 C)   Ht 5\' 6"  (1.676 m)   Wt 97 lb (44 kg)   SpO2 98%   BMI 15.66 kg/m   Physical Exam Constitutional:      Appearance: Normal appearance.  Musculoskeletal:     Cervical back: Neck supple.  Skin:    Comments: Well-healed biopsy site to left thigh.  Single suture  removed.  Neurological:     Mental Status: She is alert.    ------------------------------------------------------------------------------------------------------------------------------------------------------------------------------------------------------------------- Assessment and Plan  Rash Unclear etiology.  I think punch biopsy provides more questions than answers at this point.  She has worked as an Engineering geologist for several years and I question whether skin products she may have used in the past may have been incorporated as a foreign body, causing issues at this time.  We will see if topical clobetasol provides any relief of symptoms.   Meds ordered this encounter  Medications   clobetasol cream (TEMOVATE) 0.05 %    Sig: Apply 1 application topically 2 (two) times daily.    Dispense:  30 g    Refill:  0    No follow-ups on file.    This visit occurred during the SARS-CoV-2 public health emergency.  Safety protocols were in place, including screening questions prior to the visit, additional usage of staff PPE, and extensive cleaning of exam room while observing appropriate contact time as indicated for disinfecting solutions.

## 2021-07-05 NOTE — Assessment & Plan Note (Signed)
Unclear etiology.  I think punch biopsy provides more questions than answers at this point.  She has worked as an Engineering geologist for several years and I question whether skin products she may have used in the past may have been incorporated as a foreign body, causing issues at this time.  We will see if topical clobetasol provides any relief of symptoms.

## 2021-07-09 ENCOUNTER — Ambulatory Visit (INDEPENDENT_AMBULATORY_CARE_PROVIDER_SITE_OTHER): Payer: PPO

## 2021-07-09 ENCOUNTER — Other Ambulatory Visit: Payer: Self-pay

## 2021-07-09 ENCOUNTER — Other Ambulatory Visit: Payer: Self-pay | Admitting: Medical-Surgical

## 2021-07-09 DIAGNOSIS — F411 Generalized anxiety disorder: Secondary | ICD-10-CM

## 2021-07-09 DIAGNOSIS — R634 Abnormal weight loss: Secondary | ICD-10-CM | POA: Diagnosis not present

## 2021-07-09 DIAGNOSIS — G9389 Other specified disorders of brain: Secondary | ICD-10-CM | POA: Diagnosis not present

## 2021-07-09 DIAGNOSIS — F22 Delusional disorders: Secondary | ICD-10-CM | POA: Diagnosis not present

## 2021-07-09 DIAGNOSIS — Z85820 Personal history of malignant melanoma of skin: Secondary | ICD-10-CM | POA: Diagnosis not present

## 2021-07-09 MED ORDER — GADOBUTROL 1 MMOL/ML IV SOLN
5.0000 mL | Freq: Once | INTRAVENOUS | Status: AC | PRN
  Administered 2021-07-09: 5 mL via INTRAVENOUS

## 2021-07-11 ENCOUNTER — Other Ambulatory Visit: Payer: Self-pay

## 2021-07-11 DIAGNOSIS — N6452 Nipple discharge: Secondary | ICD-10-CM

## 2021-07-11 DIAGNOSIS — M1990 Unspecified osteoarthritis, unspecified site: Secondary | ICD-10-CM

## 2021-07-11 MED ORDER — CYCLOBENZAPRINE HCL 10 MG PO TABS: ORAL_TABLET | ORAL | 0 refills | Status: DC

## 2021-07-12 ENCOUNTER — Other Ambulatory Visit: Payer: Self-pay | Admitting: Medical-Surgical

## 2021-07-12 DIAGNOSIS — N6452 Nipple discharge: Secondary | ICD-10-CM

## 2021-07-17 DIAGNOSIS — D692 Other nonthrombocytopenic purpura: Secondary | ICD-10-CM | POA: Diagnosis not present

## 2021-07-17 DIAGNOSIS — Z85820 Personal history of malignant melanoma of skin: Secondary | ICD-10-CM | POA: Diagnosis not present

## 2021-07-17 DIAGNOSIS — L821 Other seborrheic keratosis: Secondary | ICD-10-CM | POA: Diagnosis not present

## 2021-07-17 DIAGNOSIS — Z85828 Personal history of other malignant neoplasm of skin: Secondary | ICD-10-CM | POA: Diagnosis not present

## 2021-07-17 DIAGNOSIS — D225 Melanocytic nevi of trunk: Secondary | ICD-10-CM | POA: Diagnosis not present

## 2021-07-17 DIAGNOSIS — L309 Dermatitis, unspecified: Secondary | ICD-10-CM | POA: Diagnosis not present

## 2021-07-17 DIAGNOSIS — I788 Other diseases of capillaries: Secondary | ICD-10-CM | POA: Diagnosis not present

## 2021-07-26 ENCOUNTER — Ambulatory Visit: Payer: PPO | Admitting: Adult Health

## 2021-07-26 ENCOUNTER — Other Ambulatory Visit: Payer: Self-pay

## 2021-07-26 VITALS — BP 135/80 | Wt 97.0 lb

## 2021-07-26 DIAGNOSIS — F411 Generalized anxiety disorder: Secondary | ICD-10-CM

## 2021-07-26 DIAGNOSIS — F22 Delusional disorders: Secondary | ICD-10-CM

## 2021-07-26 DIAGNOSIS — F325 Major depressive disorder, single episode, in full remission: Secondary | ICD-10-CM

## 2021-07-26 DIAGNOSIS — F5101 Primary insomnia: Secondary | ICD-10-CM | POA: Diagnosis not present

## 2021-07-26 MED ORDER — ESCITALOPRAM OXALATE 20 MG PO TABS: ORAL_TABLET | ORAL | 1 refills | Status: DC

## 2021-07-26 MED ORDER — OLANZAPINE 5 MG PO TABS: ORAL_TABLET | ORAL | 2 refills | Status: DC

## 2021-07-26 NOTE — Progress Notes (Signed)
[facility] MD/PA/NP Initial Note  07/27/2021 12:25 PM Breanna Sparks  MRN:  [id number]  Chief Complaint:   HPI:   Referred by PCP for evaluation.  Breanna Sparks is a pleasant 69 y/o female seen today for initial psychiatric evaluation.  Describes mood today as "ok". Pleasant. Tearful throughout interview. Pleasant. Mood symptoms - denies depression, anxiety, and irritability. Stating "I'm doing alright". Reports following up today because she needed to talk to someone. Reporting some concerns after a visit with PCP in September. Had initially made the appointment because she was having severe anxiety and panic attacks. Was convinced that she was seeing bugs in her home, even though family members did not. Called an exterminator to come out and he also denied any presence of bugs. Stating "they were there". She followed up with PCP in September and was diagnosed with delusion of parasitosis and started on Seroquel 25mg  at bedtime for sleep and was advised to continue Lexapro at 20mg  daily. Continues to feel like the bugs are still there. Is willing to trial Zyprexa for symptom management. Statung "I can't understand why this would happen". Identifies some stressors over the past several months. Sister passed away in 10/30/22. Worried about her grandson. Reports she has a good support system. Has 3 really good friends that she talks toVarying interest and motivation. Taking medications as prescribed.  Energy levels stable. Active, has a regular exercise routine. Yoga and fitness trainer. Enjoys some usual interests and activities. Married. Spending time with family. Married. 1 grown son - grandson.  Appetite adequate. Weight loss 97 pounds. Sleeps well most nights with Seroquel 25mg . Averages 8 hours. Focus and concentration stable. Completing tasks. Managing aspects of household.  Denies SI or HI.  Denies AH or VH.    Previous medication trials:  Seroquel, Lexapro. Lorazepam, Prozac,  Cymbalta, Has taken Ativan for sleep previously, but stopped - felt like it was bringing her down.   Visit Diagnosis:    ICD-10-CM   1. Generalized anxiety disorder  F41.1     2. Depression, major, in remission ([place])  F32.5 escitalopram (LEXAPRO) 20 MG tablet    3. Delusions of parasitosis ([place])  F22 OLANZapine (ZYPREXA) 5 MG tablet    4. Primary insomnia  F51.01       Past Psychiatric History: Denies psychiatric hospitalization.   Past Medical History:  Past Medical History:  Diagnosis Date   Anxiety    Arthritis    Cancer ([place])    skin cancer-melanoma arm   Depression    GERD (gastroesophageal reflux disease)    Glaucoma    History of squamous cell carcinoma    Thyroid disease    TMJ (temporomandibular joint syndrome)    wears gaurd at night    Past Surgical History:  Procedure Laterality Date   ABDOMINAL HYSTERECTOMY     age 67   ANTERIOR CRUCIATE LIGAMENT REPAIR Bilateral 1998   AUGMENTATION MAMMAPLASTY Bilateral    saline   BREAST SURGERY     implants   CERVICAL FUSION  2012   Dr. Ellene Route   COLONOSCOPY  2009   SQUAMOUS CELL CARCINOMA EXCISION     Multiple    Family Psychiatric History: Denies any family history of mental illness.   Family History:  Family History  Problem Relation Age of Onset   Hypertension Mother    Diabetes Mother    Heart disease Mother    Glaucoma Mother    Heart attack Father    Throat cancer Father  COPD Sister    Colon cancer Neg Hx    Esophageal cancer Neg Hx    Rectal cancer Neg Hx    Stomach cancer Neg Hx     Social History:  Social History   Socioeconomic History   Marital status: Married    Spouse name: Not on file   Number of children: Not on file   Years of education: Not on file   Highest education level: Not on file  Occupational History   Not on file  Tobacco Use   Smoking status: Never   Smokeless tobacco: Never  Vaping Use   Vaping Use: Never used  Substance and Sexual Activity   Alcohol use:  Yes    Alcohol/week: 4.0 standard drinks    Types: 4 Standard drinks or equivalent per week   Drug use: No   Sexual activity: Not on file  Other Topics Concern   Not on file  Social History Narrative   Not on file   Social Determinants of Health   Financial Resource Strain: Not on file  Food Insecurity: Not on file  Transportation Needs: Not on file  Physical Activity: Not on file  Stress: Not on file  Social Connections: Not on file    Allergies:  Allergies  Allergen Reactions   Codeine     REACTION: Itch/nausea/vomiting   Meperidine Hcl     REACTION: itch   Morphine     REACTION: tachycardia   Latex Itching and Rash    Metabolic Disorder Labs: Lab Results  Component Value Date   HGBA1C 4.7 02/22/2021   MPG 88 02/22/2021   MPG 100 11/16/2013   No results found for: PROLACTIN Lab Results  Component Value Date   CHOL 229 (H) 02/22/2021   TRIG 75 02/22/2021   HDL 111 02/22/2021   CHOLHDL 2.1 02/22/2021   VLDL 16 01/21/2017   LDLCALC 102 (H) 02/22/2021   LDLCALC 110 (H) 08/24/2020   Lab Results  Component Value Date   TSH 1.25 02/22/2021   TSH 0.98 08/24/2020    Therapeutic Level Labs: No results found for: LITHIUM No results found for: VALPROATE No components found for:  CBMZ  Current Medications: Current Outpatient Medications  Medication Sig Dispense Refill   OLANZapine (ZYPREXA) 5 MG tablet Take 1/2 tablet at bedtime for 7 days, then increase to one tablet at bedtime. 30 tablet 2   clobetasol cream (TEMOVATE) 2.58 % Apply 1 application topically 2 (two) times daily. 30 g 0   cyclobenzaprine (FLEXERIL) 10 MG tablet TAKE 1/2 TO 1 TABLET 2 TO 3 TIMES DAILY AS NEEDED FOR MUSCLE SPASMS 30 tablet 0   dorzolamide-timolol (COSOPT) 22.3-6.8 MG/ML ophthalmic solution INSTILL 1 DROP INTO BOTH EYES EVERY 12 HOURS 10 mL 1   escitalopram (LEXAPRO) 20 MG tablet Take one tablet daily. 90 tablet 1   famotidine (PEPCID) 20 MG tablet Take one tablet twice a day for  acid reflux. 180 tablet 1   latanoprost (XALATAN) 0.005 % ophthalmic solution      levothyroxine (SYNTHROID) 50 MCG tablet Take  1 tablet  Daily  on an empty stomach with only water for 30 minutes & no Antacid meds, Calcium or Magnesium for 4 hours & avoid Biotin 90 tablet 3   valACYclovir (VALTREX) 1000 MG tablet Take 1 tablet (1,000 mg total) by mouth 2 (two) times daily. 180 tablet 3   No current facility-administered medications for this visit.    Medication Side Effects: none  Orders placed this visit:  No orders of the defined types were placed in this encounter.   Psychiatric Specialty Exam:  Review of Systems  Musculoskeletal:  Negative for gait problem.  Neurological:  Negative for tremors.  Psychiatric/Behavioral:         Please refer to HPI   Blood pressure 135/80, weight 97 lb (44 kg).Body mass index is 15.66 kg/m.  General Appearance: Casual and Neat  Eye Contact:  Good  Speech:  Clear and Coherent and Normal Rate  Volume:  Normal  Mood:  Euthymic  Affect:  Appropriate and Congruent  Thought Process:  Coherent and Descriptions of Associations: Intact  Orientation:  Full (Time, Place, and Person)  Thought Content: Logical   Suicidal Thoughts:  No  Homicidal Thoughts:  No  Memory:  WNL  Judgement:  Good  Insight:  Good  Psychomotor Activity:  Normal  Concentration:  Concentration: Good  Recall:  Good  Fund of Knowledge: Good  Language: Good  Assets:  Communication Skills Desire for Improvement Financial Resources/Insurance Housing Intimacy Leisure Time Physical Health Resilience Social Support Talents/Skills Transportation Vocational/Educational  ADL's:  Intact  Cognition: WNL  Prognosis:  Good   Screenings:  GAD-7    Flowsheet [place] Office Visit from 06/20/2021 in [facility]  Total GAD-7 Score 21      PHQ2-9    [place] Office Visit from 06/20/2021 in [place] Visit from 05/02/2021 in [facility] [place] Office Visit from 02/16/2020 in [facility] [place] Office Visit from 02/11/2019 in [place] [place] [facility] [place] Office Visit from 01/21/2018 in [place] [facility]  PHQ-2 Total Score 6 0 0 0 0  PHQ-9 Total Score 20 0 0 -- --       Receiving Psychotherapy: No   Treatment Plan/Recommendations:  Plan:  PDMP reviewed  Lexapro 20mg  daily D/C Seroquel 25mg  at hs  Add Zyprexa 5mg  - 1/2 tablet at hs x 7, then increase to one tablet at hs  Discussed potential metabolic side effects associated with atypical antipsychotics, as well as potential risk for movement side effects. Advised pt to contact office if movement side effects occur.    Time spent with patient was 60 minutes.Greater than 50% of face to face time with patient was spent on counseling and coordination of care.    RTC 4 weeks  Patient advised to contact office with any questions, adverse effects, or acute worsening in signs and symptoms.   Aloha Gell, NP

## 2021-07-27 ENCOUNTER — Other Ambulatory Visit: Payer: Self-pay

## 2021-07-27 ENCOUNTER — Encounter: Payer: Self-pay | Admitting: Adult Health

## 2021-07-27 DIAGNOSIS — B009 Herpesviral infection, unspecified: Secondary | ICD-10-CM

## 2021-07-27 MED ORDER — ACYCLOVIR 5 % EX CREA: 1.0000 "application " | TOPICAL_CREAM | CUTANEOUS | 0 refills | Status: DC

## 2021-08-14 ENCOUNTER — Other Ambulatory Visit: Payer: PPO

## 2021-08-18 ENCOUNTER — Other Ambulatory Visit: Payer: Self-pay | Admitting: Adult Health

## 2021-08-18 DIAGNOSIS — F22 Delusional disorders: Secondary | ICD-10-CM

## 2021-08-22 DIAGNOSIS — H40052 Ocular hypertension, left eye: Secondary | ICD-10-CM | POA: Diagnosis not present

## 2021-08-23 ENCOUNTER — Ambulatory Visit: Payer: PPO | Admitting: Adult Health

## 2021-09-03 ENCOUNTER — Other Ambulatory Visit: Payer: PPO

## 2021-09-04 ENCOUNTER — Ambulatory Visit: Payer: PPO | Admitting: Adult Health

## 2021-09-11 ENCOUNTER — Encounter: Payer: PPO | Admitting: Adult Health

## 2021-09-13 ENCOUNTER — Ambulatory Visit: Payer: PPO | Admitting: Adult Health

## 2021-09-13 ENCOUNTER — Other Ambulatory Visit: Payer: Self-pay

## 2021-09-13 ENCOUNTER — Encounter: Payer: Self-pay | Admitting: Adult Health

## 2021-09-13 DIAGNOSIS — F411 Generalized anxiety disorder: Secondary | ICD-10-CM

## 2021-09-13 DIAGNOSIS — F5101 Primary insomnia: Secondary | ICD-10-CM

## 2021-09-13 DIAGNOSIS — F22 Delusional disorders: Secondary | ICD-10-CM | POA: Diagnosis not present

## 2021-09-13 DIAGNOSIS — F325 Major depressive disorder, single episode, in full remission: Secondary | ICD-10-CM

## 2021-09-13 NOTE — Progress Notes (Signed)
Breanna Sparks [id number] 08-23-1951 70 y.o.  Subjective:   Patient ID:  Breanna Sparks is a 70 y.o. (DOB 09-27-1951) female.  Chief Complaint: No chief complaint on file.   HPI Breanna Sparks presents to the office today for follow-up of delusions of parasitosis, insomnia, MDD, and GAD.  Describes mood today as "ok". Pleasant. Decreased tearfulness. Pleasant. Mood symptoms - denies depression, anxiety, and irritability. Denies panic attacks. Decreased worry and rumination. Stating "I'm doing so much better". Started on Zyprexa 75m and feels it is working well for her. Stating "I feel so much better". No longer seeing bugs or believes bugs are in her home. Increased interest and motivation. Feels more confident. Able to start and finish projects. Has started driving again. Getting out more -had hair done yesterday - met friends for lunch. Taking medications as prescribed.  Energy levels stable. Active, has a regular exercise routine. Yoga and fitness trainer. Enjoys some usual interests and activities. Married. Spending time with family. Married. 1 grown son - grandson.  Appetite adequate. Weight stable 98.2 - clothes feeling tighter  - 66 ". Sleeps well most nights. Averages 8 hours. Focus and concentration stable. Completing tasks. Managing aspects of household.  Denies SI or HI.  Denies AH or VH.    Previous medication trials:  Seroquel, Lexapro. Lorazepam, Prozac, Cymbalta, Has taken Ativan for sleep previously, but stopped - felt like it was bringing her down.    [place] GAD-7 Score 21      PHQ2-9    [place] [facility] [place] [facility] [place] from [place]/11/2019 [place] [place] from [facility]21/2018 [facility]  PHQ-2 Total Score 6 0 0 0 0  PHQ-9 Total Score 20 0 0 -- --        Review of Systems:  Review of Systems  Musculoskeletal:  Negative for gait problem.  Neurological:  Negative for tremors.  Psychiatric/Behavioral:         Please refer to HPI   Medications: I have reviewed the patient's current medications.  Current Outpatient Medications  Medication Sig Dispense Refill   acyclovir cream (ZOVIRAX) 5 % Apply 1 application topically every 3 (three) hours. 15 g 0   clobetasol cream (TEMOVATE) 09.37% Apply 1 application topically 2 (two) times daily. 30 g 0   cyclobenzaprine (FLEXERIL) 10 MG tablet TAKE 1/2 TO 1 TABLET 2 TO 3 TIMES DAILY AS NEEDED FOR MUSCLE SPASMS 30 tablet 0   dorzolamide-timolol (COSOPT) 22.3-6.8 MG/ML ophthalmic solution INSTILL 1 DROP INTO BOTH EYES EVERY 12 HOURS 10 mL 1   escitalopram (LEXAPRO) 20 MG tablet Take one tablet daily. 90 tablet 1   famotidine (PEPCID) 20 MG tablet Take one tablet twice a day for acid reflux. 180 tablet 1   latanoprost (XALATAN) 0.005 % ophthalmic solution      levothyroxine (SYNTHROID) 50 MCG tablet Take  1 tablet  Daily  on an empty stomach with only water for 30 minutes & no Antacid meds, Calcium or Magnesium for 4 hours & avoid Biotin 90 tablet 3   OLANZapine (ZYPREXA) 5 MG tablet Take 1/2 tablet at bedtime for 7 days, then increase to one tablet at  bedtime. 30 tablet 2   valACYclovir (VALTREX) 1000 MG tablet Take 1 tablet (1,000 mg total) by mouth 2 (two) times daily. 180 tablet 3   No current facility-administered medications for this visit.    Medication Side Effects: None  Allergies:  Allergies  Allergen Reactions   Codeine     REACTION: Itch/nausea/vomiting   Meperidine Hcl     REACTION: itch   Morphine     REACTION: tachycardia   Latex Itching and Rash    Past  Medical History:  Diagnosis Date   Anxiety    Arthritis    Cancer ([facility])    skin cancer-melanoma arm   Depression    GERD (gastroesophageal reflux disease)    Glaucoma    History of squamous cell carcinoma    Thyroid disease    TMJ (temporomandibular joint syndrome)    wears gaurd at night    Past Medical History, Surgical history, Social history, and Family history were reviewed and updated as appropriate.   Please see review of systems for further details on the patient's review from today.   Objective:   Physical Exam:  There were no vitals taken for this visit.  Physical Exam Constitutional:      General: She is not in acute distress. Musculoskeletal:        General: No deformity.  Neurological:     Mental Status: She is alert and oriented to person, place, and time.     Coordination: Coordination normal.  Psychiatric:        Attention and Perception: Attention and perception normal. She does not perceive auditory or visual hallucinations.        Mood and Affect: Mood normal. Mood is not anxious or depressed. Affect is not labile, blunt, angry or inappropriate.        Speech: Speech normal.        Behavior: Behavior normal.        Thought Content: Thought content normal. Thought content is not paranoid or delusional. Thought content does not include homicidal or suicidal ideation. Thought content does not include homicidal or suicidal plan.        Cognition and Memory: Cognition and memory normal.        Judgment: Judgment normal.     Comments: Insight intact    Lab Review:     Component Value Date/Time   NA 136 05/02/2021 1145   K 4.4 05/02/2021 1145   CL 105 05/02/2021 1145   CO2 25 05/02/2021 1145   GLUCOSE 90 05/02/2021 1145   BUN 21 05/02/2021 1145   CREATININE 0.69 05/02/2021 1145   CALCIUM 9.0 05/02/2021 1145   PROT 6.6 05/02/2021 1145   ALBUMIN 4.0 01/21/2017 1533   AST 28 05/02/2021 1145   ALT 27 05/02/2021 1145   ALKPHOS 48 01/21/2017 1533    BILITOT 0.4 05/02/2021 1145   GFRNONAA 89 02/22/2021 1135   GFRAA 104 02/22/2021 1135       Component Value Date/Time   WBC 5.0 05/02/2021 1145   RBC 3.47 (L) 05/02/2021 1145   HGB 12.7 05/02/2021 1145   HCT 38.1 05/02/2021 1145   PLT 241 05/02/2021 1145   MCV 109.8 (H) 05/02/2021 1145   MCH 36.6 (H) 05/02/2021 1145   MCHC 33.3 05/02/2021 1145   RDW 11.7 05/02/2021 1145   LYMPHSABS 1,455 05/02/2021 1145   MONOABS 450 01/21/2017 1533   EOSABS 180 05/02/2021 1145   BASOSABS 20 05/02/2021 1145    No results found for: POCLITH,  LITHIUM   No results found for: PHENYTOIN, PHENOBARB, VALPROATE, CBMZ   .res Assessment: Plan:    Plan:  PDMP reviewed  Lexapro 26m daily Zyprexa 530m- take one tablet at hs  Discussed potential metabolic side effects associated with atypical antipsychotics, as well as potential risk for movement side effects. Advised pt to contact office if movement side effects occur.    RTC 6 weeks  Patient advised to contact office with any questions, adverse effects, or acute worsening in signs and symptoms.  There are no diagnoses linked to this encounter.   Please see After Visit Summary for patient specific instructions.  Future Appointments  Date Time Provider [place]12/28/2022  2:50 PM [facility] [place] 2 [facility]  10/03/2021  3:00 PM [facility] [place] [facility]  10/03/2021  3:05 PM [facility] [place] [facility]    No orders of the defined types were placed in this encounter.   -------------------------------

## 2021-09-19 ENCOUNTER — Other Ambulatory Visit: Payer: Self-pay | Admitting: Adult Health

## 2021-09-19 DIAGNOSIS — F22 Delusional disorders: Secondary | ICD-10-CM

## 2021-09-24 DIAGNOSIS — H26493 Other secondary cataract, bilateral: Secondary | ICD-10-CM | POA: Diagnosis not present

## 2021-09-24 DIAGNOSIS — H26492 Other secondary cataract, left eye: Secondary | ICD-10-CM | POA: Diagnosis not present

## 2021-10-03 ENCOUNTER — Other Ambulatory Visit: Payer: Self-pay | Admitting: Adult Health

## 2021-10-03 ENCOUNTER — Other Ambulatory Visit: Payer: PPO

## 2021-10-03 DIAGNOSIS — F22 Delusional disorders: Secondary | ICD-10-CM

## 2021-10-07 ENCOUNTER — Other Ambulatory Visit: Payer: Self-pay | Admitting: Adult Health

## 2021-10-07 DIAGNOSIS — K219 Gastro-esophageal reflux disease without esophagitis: Secondary | ICD-10-CM

## 2021-10-25 ENCOUNTER — Other Ambulatory Visit: Payer: Self-pay

## 2021-10-25 ENCOUNTER — Ambulatory Visit: Payer: PPO | Admitting: Adult Health

## 2021-10-25 ENCOUNTER — Encounter: Payer: Self-pay | Admitting: Adult Health

## 2021-10-25 DIAGNOSIS — F22 Delusional disorders: Secondary | ICD-10-CM

## 2021-10-25 DIAGNOSIS — F5101 Primary insomnia: Secondary | ICD-10-CM | POA: Diagnosis not present

## 2021-10-25 DIAGNOSIS — F325 Major depressive disorder, single episode, in full remission: Secondary | ICD-10-CM | POA: Diagnosis not present

## 2021-10-25 DIAGNOSIS — F411 Generalized anxiety disorder: Secondary | ICD-10-CM

## 2021-10-25 MED ORDER — OLANZAPINE 5 MG PO TABS: 5.0000 mg | ORAL_TABLET | Freq: Every day | ORAL | 5 refills | Status: DC

## 2021-10-25 NOTE — Progress Notes (Signed)
Breanna Sparks [id number] [birth year] 71 y.o.  Subjective:   Patient ID:  Breanna Sparks is a 71 y.o. (DOB [birth year]) female.  Chief Complaint: No chief complaint on file.   HPI Breanna Sparks presents to the office today for follow-up of parasitosis, insomnia, MDD, and GAD.  Describes mood today as "ok". Pleasant. Decreased tearfulness. Pleasant. Mood symptoms - denies depression, anxiety and irritability. Denies panic attacks. Decreased worry and rumination. Stating "I feel so good". Feels like the Zyprexa is working well for her. Denies seeing any bugs. Increased interest and motivation. Taking medications as prescribed.  Energy levels stable. Active, has not been exercising regularly and plans to restart.  Enjoys some usual interests and activities. Married. Spending time with family. Married. 1 grown son - grandson.  Appetite adequate. Weight stable 98.2 - 105 pounds - 66". Sleeps well most nights. Averages 8 hours - 5.5 then on and off through out the night. Focus and concentration stable. Completing tasks. Managing aspects of household.  Denies SI or HI.  Denies AH or VH.   Previous medication trials:  Seroquel, Lexapro. Lorazepam, Prozac, Cymbalta, Has taken Ativan for sleep previously, but stopped - felt like it was bringing her down.     [place] Office Visit from 06/20/2021 in [facility]  Total GAD-7 Score 21      PHQ2-9    [place] Office Visit from 06/20/2021 in [facility] Visit from 05/02/2021 in [facility] [place] Office Visit from 02/16/2020 in [facility] [place] Office Visit from 02/11/2019 in [place] [facility] [place] Office Visit from 01/21/2018 in [facility] [place] [facility]  PHQ-2 Total Score 6 0 0 0 0  PHQ-9 Total Score 20 0 0 -- --         Review of Systems:  Review of Systems  Musculoskeletal:  Negative for gait problem.  Neurological:  Negative for tremors.  Psychiatric/Behavioral:         Please refer to HPI   Medications: I have reviewed the patient's current medications.  Current Outpatient Medications  Medication Sig Dispense Refill   acyclovir cream (ZOVIRAX) 5 % Apply 1 application topically every 3 (three) hours. 15 g 0   clobetasol cream (TEMOVATE) 5.62 % Apply 1 application topically 2 (two) times daily. 30 g 0   cyclobenzaprine (FLEXERIL) 10 MG tablet TAKE 1/2 TO 1 TABLET 2 TO 3 TIMES DAILY AS NEEDED FOR MUSCLE SPASMS 30 tablet 0   dorzolamide-timolol (COSOPT) 22.3-6.8 MG/ML ophthalmic solution INSTILL 1 DROP INTO BOTH EYES EVERY 12 HOURS 10 mL 1   escitalopram (LEXAPRO) 20 MG tablet Take one tablet daily. 90 tablet 1   famotidine (PEPCID) 20 MG tablet Take  1 tablet  2 x / day to Prevent Heartburn & Indigestion 180 tablet 3   latanoprost (XALATAN) 0.005 % ophthalmic solution      levothyroxine (SYNTHROID) 50 MCG tablet Take  1 tablet  Daily  on an empty stomach with only water for 30 minutes & no Antacid meds, Calcium or Magnesium for 4 hours & avoid Biotin 90 tablet 3   OLANZapine (ZYPREXA) 5 MG tablet Take 1 tablet (5 mg total) by mouth at bedtime. 30 tablet 0   valACYclovir (VALTREX) 1000 MG tablet Take 1 tablet (1,000 mg total) by mouth 2 (two) times daily. 180 tablet 3   No current facility-administered medications for this visit.  Medication Side Effects: None  Allergies:  Allergies  Allergen Reactions   Codeine     REACTION: Itch/nausea/vomiting   Meperidine Hcl     REACTION: itch   Morphine     REACTION: tachycardia   Latex Itching and Rash    Past Medical History:  Diagnosis Date   Anxiety    Arthritis    Cancer (HCC)    skin cancer-melanoma arm   Depression    GERD (gastroesophageal reflux disease)    Glaucoma    History of squamous cell carcinoma    Thyroid disease    TMJ  (temporomandibular joint syndrome)    wears gaurd at night    Past Medical History, Surgical history, Social history, and Family history were reviewed and updated as appropriate.   Please see review of systems for further details on the patient's review from today.   Objective:   Physical Exam:  There were no vitals taken for this visit.  Physical Exam Constitutional:      General: She is not in acute distress. Musculoskeletal:        General: No deformity.  Neurological:     Mental Status: She is alert and oriented to person, place, and time.     Coordination: Coordination normal.  Psychiatric:        Attention and Perception: Attention and perception normal. She does not perceive auditory or visual hallucinations.        Mood and Affect: Mood normal. Mood is not anxious or depressed. Affect is not labile, blunt, angry or inappropriate.        Speech: Speech normal.        Behavior: Behavior normal.        Thought Content: Thought content normal. Thought content is not paranoid or delusional. Thought content does not include homicidal or suicidal ideation. Thought content does not include homicidal or suicidal plan.        Cognition and Memory: Cognition and memory normal.        Judgment: Judgment normal.     Comments: Insight intact    Lab Review:     Component Value Date/Time   NA 136 05/02/2021 1145   K 4.4 05/02/2021 1145   CL 105 05/02/2021 1145   CO2 25 05/02/2021 1145   GLUCOSE 90 05/02/2021 1145   BUN 21 05/02/2021 1145   CREATININE 0.69 05/02/2021 1145   CALCIUM 9.0 05/02/2021 1145   PROT 6.6 05/02/2021 1145   ALBUMIN 4.0 01/21/2017 1533   AST 28 05/02/2021 1145   ALT 27 05/02/2021 1145   ALKPHOS 48 01/21/2017 1533   BILITOT 0.4 05/02/2021 1145   GFRNONAA 89 02/22/2021 1135   GFRAA 104 02/22/2021 1135       Component Value Date/Time   WBC 5.0 05/02/2021 1145   RBC 3.47 (L) 05/02/2021 1145   HGB 12.7 05/02/2021 1145   HCT 38.1 05/02/2021 1145   PLT  241 05/02/2021 1145   MCV 109.8 (H) 05/02/2021 1145   MCH 36.6 (H) 05/02/2021 1145   MCHC 33.3 05/02/2021 1145   RDW 11.7 05/02/2021 1145   LYMPHSABS 1,455 05/02/2021 1145   MONOABS 450 01/21/2017 1533   EOSABS 180 05/02/2021 1145   BASOSABS 20 05/02/2021 1145    No results found for: POCLITH, LITHIUM   No results found for: PHENYTOIN, PHENOBARB, VALPROATE, CBMZ   .res Assessment: Plan:    Plan:  PDMP reviewed  Lexapro 20mg  daily Zyprexa 5mg  - take one tablet at hs  Discussed potential  metabolic side effects associated with atypical antipsychotics, as well as potential risk for movement side effects. Advised pt to contact office if movement side effects occur.    RTC 6 weeks  Patient advised to contact office with any questions, adverse effects, or acute worsening in signs and symptoms.  Diagnoses and all orders for this visit:  Generalized anxiety disorder  Delusions of parasitosis ([place])  Depression, major, in remission ([place])  Primary insomnia     Please see After Visit Summary for patient specific instructions.  Future Appointments  Date Time Provider [place]  11/12/2021 10:10 AM [facility] 2 [facility]  11/12/2021 10:20 AM [facility] [place] 2 [facility]  11/12/2021 10:25 AM [facility] [place] 2 [facility]    No orders of the defined types were placed in this encounter.   -------------------------------

## 2021-11-12 ENCOUNTER — Other Ambulatory Visit: Payer: PPO

## 2021-11-23 ENCOUNTER — Ambulatory Visit (INDEPENDENT_AMBULATORY_CARE_PROVIDER_SITE_OTHER): Payer: PPO | Admitting: Medical-Surgical

## 2021-11-23 DIAGNOSIS — Z0000 Encounter for general adult medical examination without abnormal findings: Secondary | ICD-10-CM | POA: Diagnosis not present

## 2021-11-23 DIAGNOSIS — Z780 Asymptomatic menopausal state: Secondary | ICD-10-CM | POA: Diagnosis not present

## 2021-11-23 NOTE — Patient Instructions (Addendum)
[facility] Maintenance Summary and Written Plan of Care  [facility] ,  Thank you for allowing me to perform your Medicare Annual Wellness Visit and for your ongoing commitment to your health.   Health Maintenance & Immunization History Health Maintenance  Topic Date Due   MAMMOGRAM  05/30/2022   TETANUS/TDAP  11/13/2022   COLONOSCOPY (Pts 45-30yrs Insurance coverage will need to be confirmed)  06/11/2023   Pneumonia Vaccine 62+ Years old  Completed   INFLUENZA VACCINE  Completed   DEXA SCAN  Completed   COVID-19 Vaccine  Completed   Hepatitis C Screening  Completed   Zoster Vaccines- Shingrix  Completed   HPV VACCINES  Aged Out   Immunization History  Administered Date(s) Administered   Fluad Quad(high Dose 65+) 07/05/2019, 07/05/2021   Influenza Split 07/02/2013, 07/06/2014   Influenza, High Dose Seasonal PF 06/26/2015, 07/11/2016, 07/31/2017, 07/02/2018   PFIZER(Purple Top)SARS-COV-2 Vaccination 11/30/2019, 12/21/2019, 07/11/2020   Pfizer Covid-19 Vaccine Bivalent Booster 58yrs & up 07/27/2021   Pneumococcal Conjugate-13 07/25/2015   Pneumococcal Polysaccharide-23 02/03/2014, 02/22/2021   Tdap 11/13/2012   Zoster Recombinat (Shingrix) 07/05/2019, 09/08/2019   Zoster, Live 11/16/2013    These are the patient goals that we discussed:  Goals Addressed               This Visit's Progress     Patient Stated (pt-stated)        11/23/2021 AWV Goal: Exercise for General Health  Patient will verbalize understanding of the benefits of increased physical activity: Exercising regularly is important. It will improve your overall fitness, flexibility, and endurance. Regular exercise also will improve your overall health. It can help you control your weight, reduce stress, and improve your bone density. Over the next year, patient will increase physical activity as tolerated with a goal of at least 150 minutes of moderate physical activity per week.   You can tell that you are exercising at a moderate intensity if your heart starts beating faster and you start breathing faster but can still hold a conversation. Moderate-intensity exercise ideas include: Walking 1 mile (1.6 km) in about 15 minutes Biking Hiking Golfing Dancing Water aerobics Patient will verbalize understanding of everyday activities that increase physical activity by providing examples like the following: Yard work, such as: Sales promotion account executive Gardening Washing windows or floors Patient will be able to explain general safety guidelines for exercising:  Before you start a new exercise program, talk with your health care provider. Do not exercise so much that you hurt yourself, feel dizzy, or get very short of breath. Wear comfortable clothes and wear shoes with good support. Drink plenty of water while you exercise to prevent dehydration or heat stroke. Work out until your breathing and your heartbeat get faster.          This is a list of Health Maintenance Items that are overdue or due now: Bone densitometry screening Mammogram - scheduled  Orders/Referrals Placed Today: Orders Placed This Encounter  Procedures   DEXAScan    Standing Status:   Future    Standing Expiration Date:   11/23/2022    Scheduling Instructions:     Please call patient to schedule.    Order Specific Question:   Reason for exam:    Answer:   Post menopausal    Order Specific Question:   Preferred imaging location?    [facility]   (Contact our referral department at [phone] if you have not spoken with someone about your referral appointment within the next 5 days)    Follow-up Plan Follow-up with [name], NP as planned Referral for bone density has been sent.  Medicare wellness visit in one year. AVS printed and mailed to the patient.      Health Maintenance,  Female Adopting a healthy lifestyle and getting preventive care are important in promoting health and wellness. Ask your health care provider about: The right schedule for you to have regular tests and exams. Things you can do on your own to prevent diseases and keep yourself healthy. What should I know about diet, weight, and exercise? Eat a healthy diet  Eat a diet that includes plenty of vegetables, fruits, low-fat dairy products, and lean protein. Do not eat a lot of foods that are high in solid fats, added sugars, or sodium. Maintain a healthy weight Body mass index (BMI) is used to identify weight problems. It estimates body fat based on height and weight. Your health care provider can help determine your BMI and help you achieve or maintain a healthy weight. Get regular exercise Get regular exercise. This is one of the most important things you can do for your health. Most adults should: Exercise for at least 150 minutes each week. The exercise should increase your heart rate and make you sweat (moderate-intensity exercise). Do strengthening exercises at least twice a week. This is in addition to the moderate-intensity exercise. Spend less time sitting. Even light physical activity can be beneficial. Watch cholesterol and blood lipids Have your blood tested for lipids and cholesterol at 72 years of age, then have this test every 5 years. Have your cholesterol levels checked more often if: Your lipid or cholesterol levels are high. You are older than 72 years of age. You are at high risk for heart disease. What should I know about cancer screening? Depending on your health history and family history, you may need to have cancer screening at various ages. This may include screening for: Breast cancer. Cervical cancer. Colorectal cancer. Skin cancer. Lung cancer. What should I know about heart disease, diabetes, and high blood pressure? Blood pressure and heart disease High blood  pressure causes heart disease and increases the risk of stroke. This is more likely to develop in people who have high blood pressure readings or are overweight. Have your blood pressure checked: Every 3-5 years if you are 75-86 years of age. Every year if you are 49 years old or older. Diabetes Have regular diabetes screenings. This checks your fasting blood sugar level. Have the screening done: Once every three years after age 54 if you are at a normal weight and have a low risk for diabetes. More often and at a younger age if you are overweight or have a high risk for diabetes. What should I know about preventing infection? Hepatitis B If you have a higher risk for hepatitis B, you should be screened for this virus. Talk with your health care provider to find out if you are at risk for hepatitis B infection. Hepatitis C Testing is recommended for: Everyone born from [birth year] through 1966. Anyone with known risk factors for hepatitis C. Sexually transmitted infections (STIs) Get screened for STIs, including gonorrhea and chlamydia, if: You are sexually active and are younger than 72 years of age. You are older than 72 years of age and your health care provider tells you that you are  at risk for this type of infection. Your sexual activity has changed since you were last screened, and you are at increased risk for chlamydia or gonorrhea. Ask your health care provider if you are at risk. Ask your health care provider about whether you are at high risk for HIV. Your health care provider may recommend a prescription medicine to help prevent HIV infection. If you choose to take medicine to prevent HIV, you should first get tested for HIV. You should then be tested every 3 months for as long as you are taking the medicine. Pregnancy If you are about to stop having your period (premenopausal) and you may become pregnant, seek counseling before you get pregnant. Take 400 to 800 micrograms (mcg) of folic  acid every day if you become pregnant. Ask for birth control (contraception) if you want to prevent pregnancy. Osteoporosis and menopause Osteoporosis is a disease in which the bones lose minerals and strength with aging. This can result in bone fractures. If you are 64 years old or older, or if you are at risk for osteoporosis and fractures, ask your health care provider if you should: Be screened for bone loss. Take a calcium or vitamin D supplement to lower your risk of fractures. Be given hormone replacement therapy (HRT) to treat symptoms of menopause. Follow these instructions at home: Alcohol use Do not drink alcohol if: Your health care provider tells you not to drink. You are pregnant, may be pregnant, or are planning to become pregnant. If you drink alcohol: Limit how much you have to: 0-1 drink a day. Know how much alcohol is in your drink. In the [place] one drink equals one 12 oz bottle of beer (355 mL), one 5 oz glass of wine (148 mL), or one 1 oz glass of hard liquor (44 mL). Lifestyle Do not use any products that contain nicotine or tobacco. These products include cigarettes, chewing tobacco, and vaping devices, such as e-cigarettes. If you need help quitting, ask your health care provider. Do not use street drugs. Do not share needles. Ask your health care provider for help if you need support or information about quitting drugs. General instructions Schedule regular health, dental, and eye exams. Stay current with your vaccines. Tell your health care provider if: You often feel depressed. You have ever been abused or do not feel safe at home. Summary Adopting a healthy lifestyle and getting preventive care are important in promoting health and wellness. Follow your health care provider's instructions about healthy diet, exercising, and getting tested or screened for diseases. Follow your health care provider's instructions on monitoring your cholesterol and blood  pressure. This information is not intended to replace advice given to you by your health care provider. Make sure you discuss any questions you have with your health care provider. Document Revised: 02/12/2021 Document Reviewed: 02/12/2021 [employer] Patient Education  [employer].

## 2021-11-23 NOTE — Progress Notes (Signed)
MEDICARE ANNUAL WELLNESS VISIT  11/23/2021  Telephone Visit Disclaimer This Medicare AWV was conducted by telephone due to national recommendations for restrictions regarding the COVID-19 Pandemic (e.g. social distancing).  I verified, using two identifiers, that I am speaking with Breanna Sparks or their authorized healthcare agent. I discussed the limitations, risks, security, and privacy concerns of performing an evaluation and management service by telephone and the potential availability of an in-person appointment in the future. The patient expressed understanding and agreed to proceed.  Location of Patient: Home Location of Provider (nurse):  In the office  Subjective:    Breanna Sparks is a 70 y.o. female patient of [name], NP who had a Medicare Annual Wellness Visit today via telephone. Breanna Sparks is Retired and lives with their spouse. she has 1 child. she reports that she is socially active and does interact with friends/family regularly. she is moderately physically active and enjoys decorating and rearranging. .  Patient Care Team: [name], NP as PCP - General (Nurse Practitioner) Lafayette Dragon, MD (Inactive) as Consulting Physician (Gastroenterology) Macarthur Critchley, Tull[place] as Referring Physician (Optometry) Martinique, Amy, MD as Consulting Physician (Dermatology) Kristeen Miss, MD as Consulting Physician (Neurosurgery)  Advanced Directives 11/23/2021 06/20/2021 02/16/2020 02/16/2020 02/11/2019 02/05/2018 01/21/2018  Does Patient Have a Medical Advance Directive? Yes No;Yes Yes Yes Yes Yes Yes  Type of Advance Directive Living will;Healthcare Power of [place];Living will [place];Living will [place];Living will [place];Living will -  Does patient want to make changes to medical advance directive? No - Patient declined No - Patient declined No -  Patient declined - - No - Patient declined No - Patient declined  Copy of [place] in Chart? No - copy requested - - No - copy requested No - copy requested No - copy requested -  Would patient like information on creating a medical advance directive? - No - Patient declined - - - - [facility] Utilization Over the Past 12 Months: # of hospitalizations or ER visits: 0 # of surgeries: 0  Review of Systems    Patient reports that her overall health is better compared to last year.  History obtained from chart review and the patient  Patient Reported Readings (BP, Pulse, CBG, Weight, etc) none  Pain Assessment Pain : No/denies pain     Current Medications & Allergies (verified) Allergies as of 11/23/2021       Reactions   Codeine    REACTION: Itch/nausea/vomiting   Meperidine Hcl    REACTION: itch   Morphine    REACTION: tachycardia   Latex Itching, Rash        Medication List        Accurate as of November 23, 2021  3:37 PM. If you have any questions, ask your nurse or doctor.          acyclovir cream 5 % Commonly known as: ZOVIRAX Apply 1 application topically every 3 (three) hours.   clobetasol cream 0.05 % Commonly known as: TEMOVATE Apply 1 application topically 2 (two) times daily.   cyclobenzaprine 10 MG tablet Commonly known as: FLEXERIL TAKE 1/2 TO 1 TABLET 2 TO 3 TIMES DAILY AS NEEDED FOR MUSCLE SPASMS   dorzolamide-timolol 22.3-6.8 MG/ML ophthalmic solution Commonly known as: COSOPT INSTILL 1 DROP INTO BOTH EYES EVERY 12 HOURS   escitalopram 20 MG tablet Commonly known as: LEXAPRO  Take one tablet daily.   famotidine 20 MG tablet Commonly known as: PEPCID Take  1 tablet  2 x / day to Prevent Heartburn & Indigestion   Fish Oil 1000 MG Caps SMARTSIG:1 By Mouth   FOLATE PO Take by mouth.   latanoprost 0.005 % ophthalmic solution Commonly known as: XALATAN   levothyroxine 50 MCG tablet Commonly known as:  SYNTHROID Take  1 tablet  Daily  on an empty stomach with only water for 30 minutes & no Antacid meds, Calcium or Magnesium for 4 hours & avoid Biotin   MULTIVITAMIN ADULT PO SMARTSIG:1 By Mouth   OLANZapine 5 MG tablet Commonly known as: ZYPREXA Take 1 tablet (5 mg total) by mouth at bedtime.   Potassium 99 MG Tabs Take by mouth.   UNABLE TO FIND Med Name: ostaDerm V Topical cream (hormones)   valACYclovir 1000 MG tablet Commonly known as: VALTREX Take 1 tablet (1,000 mg total) by mouth 2 (two) times daily.   VITAMIN B 12 PO Take by mouth.   Vitamin B-Complex Tabs SMARTSIG:1 By Mouth   vitamin C 1000 MG tablet SMARTSIG:1 By Mouth   Vitamin D-3 125 MCG (5000 UT) Tabs SMARTSIG:1 By Mouth        History (reviewed): Past Medical History:  Diagnosis Date   Anxiety    Arthritis    Cancer ([place])    skin cancer-melanoma arm   Depression    GERD (gastroesophageal reflux disease)    Glaucoma    History of squamous cell carcinoma    Thyroid disease    TMJ (temporomandibular joint syndrome)    wears gaurd at night   Past Surgical History:  Procedure Laterality Date   ABDOMINAL HYSTERECTOMY     age 33   ANTERIOR CRUCIATE LIGAMENT REPAIR Bilateral 1998   AUGMENTATION MAMMAPLASTY Bilateral    saline   BREAST SURGERY     implants   CERVICAL FUSION  2012   Dr. Ellene Route   COLONOSCOPY  2009   SQUAMOUS CELL CARCINOMA EXCISION     Multiple   Family History  Problem Relation Age of Onset   Hypertension Mother    Diabetes Mother    Heart disease Mother    Glaucoma Mother    Heart attack Father    Throat cancer Father    COPD Sister    Colon cancer Neg Hx    Esophageal cancer Neg Hx    Rectal cancer Neg Hx    Stomach cancer Neg Hx    Social History   Socioeconomic History   Marital status: Married    Spouse name: Aneliz Carbary   Number of children: 1   Years of education: 13   Highest education level: Some college, no degree  Occupational History    Occupation: Retired.  Tobacco Use   Smoking status: Never   Smokeless tobacco: Never  Vaping Use   Vaping Use: Never used  Substance and Sexual Activity   Alcohol use: Yes    Alcohol/week: 4.0 standard drinks    Types: 4 Standard drinks or equivalent per week   Drug use: No   Sexual activity: Not on file  Other Topics Concern   Not on file  Social History Narrative   Lives with your husband. She has one son. She enjoys decorating and rearranging.    Social Determinants of Health   Financial Resource Strain: Low Risk    Difficulty of Paying Living Expenses: Not hard at all  Food Insecurity: No Food Insecurity  Worried About Charity fundraiser in the Last Year: Never true   [place] in the Last Year: Never true  Transportation Needs: No Transportation Needs   Lack of Transportation (Medical): No   Lack of Transportation (Non-Medical): No  Physical Activity: Sufficiently Active   Days of Exercise per Week: 3 days   Minutes of Exercise per Session: 120 min  Stress: Stress Concern Present   Feeling of Stress : To some extent  Social Connections: Moderately Integrated   Frequency of Communication with Friends and Family: More than three times a week   Frequency of Social Gatherings with Friends and Family: Three times a week   Attends Religious Services: More than 4 times per year   Active Member of Clubs or Organizations: No   Attends Archivist Meetings: Never   Marital Status: Married    Activities of Daily Living In your present state of health, do you have any difficulty performing the following activities: 11/23/2021 05/05/2021  Hearing? N N  Vision? Y N  Comment under close monitoring with her opthamalogist. -  Difficulty concentrating or making decisions? N N  Walking or climbing stairs? N N  Dressing or bathing? N N  Doing errands, shopping? N N  Preparing Food and eating ? N -  Using the Toilet? N -  In the past six months, have you accidently  leaked urine? N -  Do you have problems with loss of bowel control? N -  Managing your Medications? N -  Managing your Finances? N -  Housekeeping or managing your Housekeeping? N -  Some recent data might be hidden    Patient Education/ Literacy How often do you need to have someone help you when you read instructions, pamphlets, or other written materials from your doctor or pharmacy?: 1 - Never What is the last grade level you completed in school?: 12th grade and continuing education in skin care  Exercise Current Exercise Habits: Home exercise routine, Type of exercise: strength training/weights;calisthenics, Time (Minutes): > 60, Frequency (Times/Week): 3, Weekly Exercise (Minutes/Week): 0, Intensity: Moderate, Exercise limited by: None identified  Diet Patient reports consuming 3 meals a day and 1 snack(s) a day Patient reports that her primary diet is: Regular Patient reports that she does have regular access to food.   Depression Screen PHQ 2/9 Scores 11/23/2021 06/20/2021 05/05/2021 02/16/2020 02/11/2019 01/21/2018 07/29/2017  PHQ - 2 Score 0 6 0 0 0 0 0  PHQ- 9 Score - 20 0 0 - - -     Fall Risk Fall Risk  11/23/2021 06/20/2021 05/05/2021 02/16/2020 02/11/2019  Falls in the past year? 1 1 0 0 0  Number falls in past yr: 0 1 - 0 -  Injury with Fall? 0 0 - 0 -  Risk for fall due to : No Fall Risks History of fall(s) No Fall Risks No Fall Risks -  Follow up Falls evaluation completed;Education provided Falls evaluation completed Falls evaluation completed;Education provided;Falls prevention discussed Falls prevention discussed -     Objective:  Breanna Sparks seemed alert and oriented and she participated appropriately during our telephone visit.  Blood Pressure Weight BMI  BP Readings from Last 3 Encounters:  07/05/21 (!) 145/85  06/29/21 (!) 155/89  06/26/21 (!) 157/92   Wt Readings from Last 3 Encounters:  07/05/21 97 lb (44 kg)  06/29/21 93 lb 4.8 oz (42.3 kg)   06/26/21 93 lb 9.6 oz (42.5 kg)   BMI Readings from  Last 1 Encounters:  07/05/21 15.66 kg/m    *Unable to obtain current vital signs, weight, and BMI due to telephone visit type  Hearing/Vision  Breanna Sparks did not seem to have difficulty with hearing/understanding during the telephone conversation Reports that she has had a formal eye exam by an eye care professional within the past year Reports that she has not had a formal hearing evaluation within the past year *Unable to fully assess hearing and vision during telephone visit type  Cognitive Function: 6CIT Screen 11/23/2021  What Year? 0 points  What month? 0 points  What time? 0 points  Count back from 20 0 points  Months in reverse 0 points  Repeat phrase 0 points  Total Score 0   (Normal:0-7, Significant for Dysfunction: >8)  Normal Cognitive Function Screening: Yes   Immunization & Health Maintenance Record Immunization History  Administered Date(s) Administered   Fluad Quad(high Dose 65+) 07/05/2019, 07/05/2021   Influenza Split 07/02/2013, 07/06/2014   Influenza, High Dose Seasonal PF 06/26/2015, 07/11/2016, 07/31/2017, 07/02/2018   PFIZER(Purple Top)SARS-COV-2 Vaccination 11/30/2019, 12/21/2019, 07/11/2020   Pfizer Covid-19 Vaccine Bivalent Booster 66yrs & up 07/27/2021   Pneumococcal Conjugate-13 07/25/2015   Pneumococcal Polysaccharide-23 02/03/2014, 02/22/2021   Tdap 11/13/2012   Zoster Recombinat (Shingrix) 07/05/2019, 09/08/2019   Zoster, Live 11/16/2013    Health Maintenance  Topic Date Due   MAMMOGRAM  05/30/2022   TETANUS/TDAP  11/13/2022   COLONOSCOPY (Pts 45-43yrs Insurance coverage will need to be confirmed)  06/11/2023   Pneumonia Vaccine 61+ Years old  Completed   INFLUENZA VACCINE  Completed   DEXA SCAN  Completed   COVID-19 Vaccine  Completed   Hepatitis C Screening  Completed   Zoster Vaccines- Shingrix  Completed   HPV VACCINES  Aged Out       Assessment  This is a routine wellness  examination for Breanna Sparks.  Health Maintenance: Due or Overdue There are no preventive care reminders to display for this patient.   Breanna Sparks does not need a referral for Community Assistance: Care Management:   no Social Work:    no Prescription Assistance:  no Nutrition/Diabetes Education:  no   Plan:  Personalized Goals  Goals Addressed               This Visit's Progress     Patient Stated (pt-stated)        11/23/2021 AWV Goal: Exercise for General Health  Patient will verbalize understanding of the benefits of increased physical activity: Exercising regularly is important. It will improve your overall fitness, flexibility, and endurance. Regular exercise also will improve your overall health. It can help you control your weight, reduce stress, and improve your bone density. Over the next year, patient will increase physical activity as tolerated with a goal of at least 150 minutes of moderate physical activity per week.  You can tell that you are exercising at a moderate intensity if your heart starts beating faster and you start breathing faster but can still hold a conversation. Moderate-intensity exercise ideas include: Walking 1 mile (1.6 km) in about 15 minutes Biking Hiking Golfing Dancing Water aerobics Patient will verbalize understanding of everyday activities that increase physical activity by providing examples like the following: Yard work, such as: Sales promotion account executive Gardening Washing windows or floors Patient will be able to explain general safety guidelines for exercising:  Before you start a new exercise program,  talk with your health care provider. Do not exercise so much that you hurt yourself, feel dizzy, or get very short of breath. Wear comfortable clothes and wear shoes with good support. Drink plenty of water while you exercise to  prevent dehydration or heat stroke. Work out until your breathing and your heartbeat get faster.        Personalized Health Maintenance & Screening Recommendations  Bone densitometry screening Mammogram - scheduled  Lung Cancer Screening Recommended: no (Low Dose CT Chest recommended if Age 82-80 years, 30 pack-year currently smoking OR have quit w/in past 15 years) Hepatitis C Screening recommended: no HIV Screening recommended: no  Advanced Directives: Written information was not prepared per patient's request.  Referrals & Orders Orders Placed This Encounter  Procedures   [place]    Follow-up Plan Follow-up with [name], NP as planned Referral for bone density has been sent.  Medicare wellness visit in one year. AVS printed and mailed to the patient.   I have personally reviewed and noted the following in the patients chart:   Medical and social history Use of alcohol, tobacco or illicit drugs  Current medications and supplements Functional ability and status Nutritional status Physical activity Advanced directives List of other physicians Hospitalizations, surgeries, and ER visits in previous 12 months Vitals Screenings to include cognitive, depression, and falls Referrals and appointments  In addition, I have reviewed and discussed with Breanna Sparks certain preventive protocols, quality metrics, and best practice recommendations. A written personalized care plan for preventive services as well as general preventive health recommendations is available and can be mailed to the patient at her request.      Tinnie Gens, RN  11/23/2021

## 2021-12-03 ENCOUNTER — Other Ambulatory Visit: Payer: Self-pay | Admitting: Adult Health

## 2021-12-03 DIAGNOSIS — F22 Delusional disorders: Secondary | ICD-10-CM

## 2021-12-03 NOTE — Telephone Encounter (Signed)
Appt thursday

## 2021-12-06 ENCOUNTER — Encounter: Payer: Self-pay | Admitting: Adult Health

## 2021-12-06 ENCOUNTER — Other Ambulatory Visit: Payer: Self-pay

## 2021-12-06 ENCOUNTER — Ambulatory Visit: Payer: PPO | Admitting: Adult Health

## 2021-12-06 DIAGNOSIS — F411 Generalized anxiety disorder: Secondary | ICD-10-CM

## 2021-12-06 DIAGNOSIS — F325 Major depressive disorder, single episode, in full remission: Secondary | ICD-10-CM

## 2021-12-06 DIAGNOSIS — F5101 Primary insomnia: Secondary | ICD-10-CM

## 2021-12-06 DIAGNOSIS — F22 Delusional disorders: Secondary | ICD-10-CM | POA: Diagnosis not present

## 2021-12-06 MED ORDER — ESCITALOPRAM OXALATE 20 MG PO TABS: ORAL_TABLET | ORAL | 1 refills | Status: DC

## 2021-12-06 MED ORDER — OLANZAPINE 5 MG PO TABS: ORAL_TABLET | ORAL | 1 refills | Status: DC

## 2021-12-06 NOTE — Progress Notes (Signed)
Breanna Sparks [id number] Jun 07, 1951 71 y.o.  Subjective:   Patient ID:  Breanna Sparks is a 71 y.o. (DOB 1951-04-17) female.  Chief Complaint: No chief complaint on file.   HPI Breanna Sparks presents to the office today for follow-up of parasitosis, insomnia, MDD, and GAD.  Describes mood today as "ok". Pleasant. Decreased tearfulness. Pleasant. Mood symptoms - denies depression, anxiety and irritability. Denies panic attacks. Decreased worry and rumination. Stating "I'm doing good". Feels like medications are working well. Increased interest and motivation. Taking medications as prescribed.  Energy levels stable. Active, has a regular exercise routine. Enjoys some usual interests and activities. Married. Spending time with family. Married. 1 grown son - grandson.  Appetite adequate. Weight stable - 105 pounds - 66". Sleeps well most nights. Averages 8 hours. Focus and concentration stable. Completing tasks. Managing aspects of household.  Denies SI or HI.  Denies AH or VH.  Previous medication trials:  Seroquel, Lexapro. Lorazepam, Prozac, Cymbalta, Has taken Ativan for sleep previously, but stopped - felt like it was bringing her down.   AUDIT    Flowsheet Row Clinical Support from 11/23/2021 in [place]  Alcohol Use Disorder Identification Test Final Score (AUDIT) 4      GAD-7    Flowsheet Row Office Visit from 06/20/2021 in [place]  Total GAD-7 Score 21      PHQ2-9    Flowsheet Row Clinical Support from 11/23/2021 in [facility] Office Visit from 06/20/2021 in [place] Visit from 05/02/2021 in [place] [facility] [place] Office Visit from 02/16/2020 in [place] [facility] [place] Office Visit from 02/11/2019 in [place] [facility]  PHQ-2 Total Score 0 6 0 0 0  PHQ-9 Total Score -- 20 0 0 --        Review of Systems:  Review of Systems  Musculoskeletal:  Negative for gait problem.  Neurological:  Negative for tremors.  Psychiatric/Behavioral:         Please refer to HPI   Medications: I have reviewed the patient's current medications.  Current Outpatient Medications  Medication Sig Dispense Refill   acyclovir cream (ZOVIRAX) 5 % Apply 1 application topically every 3 (three) hours. (Patient not taking: Reported on 11/23/2021) 15 g 0   Ascorbic Acid (VITAMIN C) 1000 MG tablet SMARTSIG:1 By Mouth     B Complex Vitamins (VITAMIN B-COMPLEX) TABS SMARTSIG:1 By Mouth     Cholecalciferol (VITAMIN D-3) 125 MCG (5000 UT) TABS SMARTSIG:1 By Mouth     clobetasol cream (TEMOVATE) 4.03 % Apply 1 application topically 2 (two) times daily. (Patient not taking: Reported on 11/23/2021) 30 g 0   Cyanocobalamin (VITAMIN B 12 PO) Take by mouth.     cyclobenzaprine (FLEXERIL) 10 MG tablet TAKE 1/2 TO 1 TABLET 2 TO 3 TIMES DAILY AS NEEDED FOR MUSCLE SPASMS 30 tablet 0   dorzolamide-timolol (COSOPT) 22.3-6.8 MG/ML ophthalmic solution INSTILL 1 DROP INTO BOTH EYES EVERY 12 HOURS 10 mL 1   escitalopram (LEXAPRO) 20 MG tablet Take one tablet daily. 90 tablet 1   famotidine (PEPCID) 20 MG tablet Take  1 tablet  2 x / day to Prevent Heartburn & Indigestion 474 tablet 3   Folic Acid (FOLATE PO) Take by mouth.     latanoprost (XALATAN) 0.005 % ophthalmic solution      levothyroxine (SYNTHROID) 50 MCG tablet  Take  1 tablet  Daily  on an empty stomach with only water for 30 minutes & no Antacid meds, Calcium or Magnesium for 4 hours & avoid Biotin 90 tablet 3   Multiple Vitamin (MULTIVITAMIN ADULT PO) SMARTSIG:1 By Mouth     OLANZapine (ZYPREXA) 5 MG tablet TAKE 1 TABLET BY MOUTH EVERYDAY AT BEDTIME 90 tablet 1   Omega-3 Fatty Acids (FISH OIL) 1000 MG CAPS SMARTSIG:1 By Mouth     Potassium 99 MG TABS Take by mouth.     UNABLE TO FIND  Med Name: ostaDerm V Topical cream (hormones)     valACYclovir (VALTREX) 1000 MG tablet Take 1 tablet (1,000 mg total) by mouth 2 (two) times daily. 180 tablet 3   No current facility-administered medications for this visit.    Medication Side Effects: None  Allergies:  Allergies  Allergen Reactions   Codeine     REACTION: Itch/nausea/vomiting   Meperidine Hcl     REACTION: itch   Morphine     REACTION: tachycardia   Latex Itching and Rash    Past Medical History:  Diagnosis Date   Anxiety    Arthritis    Cancer ([facility])    skin cancer-melanoma arm   Depression    GERD (gastroesophageal reflux disease)    Glaucoma    History of squamous cell carcinoma    Thyroid disease    TMJ (temporomandibular joint syndrome)    wears gaurd at night    Past Medical History, Surgical history, Social history, and Family history were reviewed and updated as appropriate.   Please see review of systems for further details on the patient's review from today.   Objective:   Physical Exam:  There were no vitals taken for this visit.  Physical Exam Constitutional:      General: She is not in acute distress. Musculoskeletal:        General: No deformity.  Neurological:     Mental Status: She is alert and oriented to person, place, and time.     Coordination: Coordination normal.  Psychiatric:        Attention and Perception: Attention and perception normal. She does not perceive auditory or visual hallucinations.        Mood and Affect: Mood normal. Mood is not anxious or depressed. Affect is not labile, blunt, angry or inappropriate.        Speech: Speech normal.        Behavior: Behavior normal.        Thought Content: Thought content normal. Thought content is not paranoid or delusional. Thought content does not include homicidal or suicidal ideation. Thought content does not include homicidal or suicidal plan.        Cognition and Memory: Cognition and memory normal.         Judgment: Judgment normal.     Comments: Insight intact    Lab Review:     Component Value Date/Time   NA 136 05/02/2021 1145   K 4.4 05/02/2021 1145   CL 105 05/02/2021 1145   CO2 25 05/02/2021 1145   GLUCOSE 90 05/02/2021 1145   BUN 21 05/02/2021 1145   CREATININE 0.69 05/02/2021 1145   CALCIUM 9.0 05/02/2021 1145   PROT 6.6 05/02/2021 1145   ALBUMIN 4.0 01/21/2017 1533   AST 28 05/02/2021 1145   ALT 27 05/02/2021 1145   ALKPHOS 48 01/21/2017 1533   BILITOT 0.4 05/02/2021 1145   GFRNONAA 89 02/22/2021 1135   GFRAA 104 02/22/2021  1135       Component Value Date/Time   WBC 5.0 05/02/2021 1145   RBC 3.47 (L) 05/02/2021 1145   HGB 12.7 05/02/2021 1145   HCT 38.1 05/02/2021 1145   PLT 241 05/02/2021 1145   MCV 109.8 (H) 05/02/2021 1145   MCH 36.6 (H) 05/02/2021 1145   MCHC 33.3 05/02/2021 1145   RDW 11.7 05/02/2021 1145   LYMPHSABS 1,455 05/02/2021 1145   MONOABS 450 01/21/2017 1533   EOSABS 180 05/02/2021 1145   BASOSABS 20 05/02/2021 1145    No results found for: POCLITH, LITHIUM   No results found for: PHENYTOIN, PHENOBARB, VALPROATE, CBMZ   .res Assessment: Plan:    Plan:  PDMP reviewed  Lexapro 20mg  daily Zyprexa 5mg  - take one tablet at hs  Discussed potential metabolic side effects associated with atypical antipsychotics, as well as potential risk for movement side effects. Advised pt to contact office if movement side effects occur.  Discussed potential metabolic side effects associated with atypical antipsychotics, as well as potential risk for movement side effects. Advised pt to contact office if movement side effects occur.      RTC 2 months  Patient advised to contact office with any questions, adverse effects, or acute worsening in signs and symptoms.  Diagnoses and all orders for this visit:  Generalized anxiety disorder  Depression, major, in remission ([place]) -     escitalopram (LEXAPRO) 20 MG tablet; Take one tablet daily.  Delusions  of parasitosis ([place]) -     OLANZapine (ZYPREXA) 5 MG tablet; TAKE 1 TABLET BY MOUTH EVERYDAY AT BEDTIME  Primary insomnia     Please see After Visit Summary for patient specific instructions.  Future Appointments  Date Time Provider [place]  12/07/2021  2:20 PM [facility] DIAG [place] 1 [facility]  12/07/2021  2:30 PM [facility] [place] 1 [facility]  12/07/2021  2:35 PM GI-[facility]G [place] 1 [facility]  01/01/2022  1:00 PM [name], NP [facility]K-PCK None  11/26/2022  3:00 PM PCK-NURSE HEALTH ADVISOR PCK-PCK None    No orders of the defined types were placed in this encounter.   -------------------------------

## 2021-12-07 ENCOUNTER — Ambulatory Visit
Admission: RE | Admit: 2021-12-07 | Discharge: 2021-12-07 | Disposition: A | Discharge status: Home / Self Care | Payer: PPO | Source: Ambulatory Visit | Attending: Medical-Surgical | Admitting: Medical-Surgical

## 2021-12-07 ENCOUNTER — Other Ambulatory Visit: Payer: Self-pay | Admitting: Medical-Surgical

## 2021-12-07 ENCOUNTER — Ambulatory Visit: Payer: PPO

## 2021-12-07 DIAGNOSIS — Z1231 Encounter for screening mammogram for malignant neoplasm of breast: Secondary | ICD-10-CM | POA: Diagnosis not present

## 2021-12-07 DIAGNOSIS — N6452 Nipple discharge: Secondary | ICD-10-CM

## 2021-12-13 ENCOUNTER — Encounter: Payer: Self-pay | Admitting: Medical-Surgical

## 2022-01-01 ENCOUNTER — Other Ambulatory Visit: Payer: Self-pay

## 2022-01-01 ENCOUNTER — Ambulatory Visit (INDEPENDENT_AMBULATORY_CARE_PROVIDER_SITE_OTHER): Payer: PPO | Admitting: Medical-Surgical

## 2022-01-01 ENCOUNTER — Encounter: Payer: Self-pay | Admitting: Medical-Surgical

## 2022-01-01 VITALS — BP 131/81 | HR 75 | Ht 66.0 in | Wt 109.0 lb

## 2022-01-01 DIAGNOSIS — F411 Generalized anxiety disorder: Secondary | ICD-10-CM

## 2022-01-01 DIAGNOSIS — K219 Gastro-esophageal reflux disease without esophagitis: Secondary | ICD-10-CM

## 2022-01-01 DIAGNOSIS — J302 Other seasonal allergic rhinitis: Secondary | ICD-10-CM | POA: Diagnosis not present

## 2022-01-01 DIAGNOSIS — M1990 Unspecified osteoarthritis, unspecified site: Secondary | ICD-10-CM

## 2022-01-01 MED ORDER — FAMOTIDINE 20 MG PO TABS: ORAL_TABLET | ORAL | 3 refills | Status: DC

## 2022-01-01 MED ORDER — CYCLOBENZAPRINE HCL 10 MG PO TABS: ORAL_TABLET | ORAL | 2 refills | Status: DC

## 2022-01-01 MED ORDER — LEVOCETIRIZINE DIHYDROCHLORIDE 5 MG PO TABS: 5.0000 mg | ORAL_TABLET | Freq: Every evening | ORAL | 1 refills | Status: DC

## 2022-01-01 MED ORDER — AZELASTINE HCL 0.1 % NA SOLN: 2.0000 | Freq: Two times a day (BID) | NASAL | 1 refills | Status: DC

## 2022-01-01 NOTE — Progress Notes (Signed)
?  HPI with pertinent ROS:  ? ?CC: General follow-up ? ?HPI: ?Pleasant 71 year old female presenting today for a general follow-up and medication refills.  She has been doing very well since her last visit at our office.  She was able to get in with psychiatry who has been working with her on the delusions of parasitosis as well as underlying anxiety and depression.  She is now taking Lexapro 20 mg daily, tolerating well without side effects.  Was started on olanzapine 5 mg nightly and took this for a while but stopped it approximately a week and 1/2 to 2 weeks ago.  Notes the Zyprexa left her with a bloated feeling in the abdominal area.  She has gained a few pounds which she notes was absolutely beneficial however since stopping the Zyprexa, she notes that she feels better overall.  She does have some continuing anxiety but this is mostly related to her grandson.  He has had quite an adventurous year and was in a motorcycle accident not too long ago where the driver of the motorcycle died on impact at a high rate of speed.  She does see psychiatry but has not contacted her regarding stopping the Zyprexa or management of her continued anxiety. ? ?Continues to have intermittent muscle spasms of her lower back.  Uses cyclobenzaprine 10 mg up to 3 times daily as needed which provides good relief.  Is requesting a refill on this. ? ?GERD-taking famotidine 20 mg twice daily as needed for reflux management.  Feels the medication works very well and is requesting a refill. ? ?Seasonal allergies-notes that her allergies have been extremely difficult to manage over the past few weeks.  She is not currently taking a daily antihistamine or using any particular nasal sprays.  Is interested in options to help manage her symptoms which mostly consist of runny nose and itchy ears. ? ?I reviewed the past medical history, family history, social history, surgical history, and allergies today and no changes were needed.  Please see  the problem list section below in epic for further details. ? ? ?Physical exam:  ? ?General: Well Developed, well nourished, and in no acute distress.  ?Neuro: Alert and oriented x3.  ?HEENT: Normocephalic, atraumatic.  ?Skin: Warm and dry. ?Cardiac: Regular rate and rhythm, no murmurs rubs or gallops, no lower extremity edema.  ?Respiratory: Clear to auscultation bilaterally. Not using accessory muscles, speaking in full sentences. ? ?Impression and Recommendations:   ? ?1. Arthritis ?Refilling Flexeril. ?- cyclobenzaprine (FLEXERIL) 10 MG tablet; TAKE 1/2 TO 1 TABLET 2 TO 3 TIMES DAILY AS NEEDED FOR MUSCLE SPASMS  Dispense: 90 tablet; Refill: 2 ? ?2. Gastroesophageal reflux disease without esophagitis ?Refilling famotidine. ?- famotidine (PEPCID) 20 MG tablet; Take  1 tablet  2 x / day to Prevent Heartburn & Indigestion  Dispense: 180 tablet; Refill: 3 ? ?3. Generalized anxiety disorder ?Since she is currently under the care of psychiatry and has confounding factors such as delusions of her cytosis and depression, recommend she contact her psychiatrist to update her on her current medications, stopping the Zyprexa, and her continuing anxiety.  Patient verbalized understanding and is agreeable to the plan. ? ?4. Seasonal allergic rhinitis, unspecified trigger ?Starting Xyzal 5 mg nightly.  Sending in Astelin nasal spray for twice daily use as needed. ? ?Return in about 6 months (around 07/04/2022) for chronic disease follow up. ?___________________________________________ ?Clearnce Sorrel, DNP, APRN, FNP-BC ?Primary Care and Sports Medicine ?[place] ?

## 2022-01-12 ENCOUNTER — Other Ambulatory Visit: Payer: Self-pay | Admitting: Internal Medicine

## 2022-01-12 DIAGNOSIS — E039 Hypothyroidism, unspecified: Secondary | ICD-10-CM

## 2022-01-22 DIAGNOSIS — Z85828 Personal history of other malignant neoplasm of skin: Secondary | ICD-10-CM | POA: Diagnosis not present

## 2022-01-22 DIAGNOSIS — C44729 Squamous cell carcinoma of skin of left lower limb, including hip: Secondary | ICD-10-CM | POA: Diagnosis not present

## 2022-01-22 DIAGNOSIS — Z85820 Personal history of malignant melanoma of skin: Secondary | ICD-10-CM | POA: Diagnosis not present

## 2022-01-22 DIAGNOSIS — D225 Melanocytic nevi of trunk: Secondary | ICD-10-CM | POA: Diagnosis not present

## 2022-01-22 DIAGNOSIS — L570 Actinic keratosis: Secondary | ICD-10-CM | POA: Diagnosis not present

## 2022-01-22 DIAGNOSIS — D485 Neoplasm of uncertain behavior of skin: Secondary | ICD-10-CM | POA: Diagnosis not present

## 2022-01-22 DIAGNOSIS — L821 Other seborrheic keratosis: Secondary | ICD-10-CM | POA: Diagnosis not present

## 2022-01-22 DIAGNOSIS — D2272 Melanocytic nevi of left lower limb, including hip: Secondary | ICD-10-CM | POA: Diagnosis not present

## 2022-01-25 ENCOUNTER — Other Ambulatory Visit: Payer: Self-pay | Admitting: Medical-Surgical

## 2022-02-05 ENCOUNTER — Ambulatory Visit: Payer: PPO | Admitting: Adult Health

## 2022-02-10 ENCOUNTER — Other Ambulatory Visit: Payer: Self-pay | Admitting: Medical-Surgical

## 2022-05-06 ENCOUNTER — Other Ambulatory Visit: Payer: Self-pay | Admitting: Internal Medicine

## 2022-05-06 DIAGNOSIS — E039 Hypothyroidism, unspecified: Secondary | ICD-10-CM

## 2022-05-10 ENCOUNTER — Other Ambulatory Visit: Payer: PPO

## 2022-05-16 ENCOUNTER — Other Ambulatory Visit: Payer: Self-pay | Admitting: Medical-Surgical

## 2022-05-16 DIAGNOSIS — M1990 Unspecified osteoarthritis, unspecified site: Secondary | ICD-10-CM

## 2022-06-18 DIAGNOSIS — H40053 Ocular hypertension, bilateral: Secondary | ICD-10-CM | POA: Diagnosis not present

## 2022-06-20 ENCOUNTER — Other Ambulatory Visit: Payer: Self-pay | Admitting: Medical-Surgical

## 2022-06-20 ENCOUNTER — Other Ambulatory Visit: Payer: Self-pay | Admitting: Adult Health

## 2022-06-20 DIAGNOSIS — M1990 Unspecified osteoarthritis, unspecified site: Secondary | ICD-10-CM

## 2022-06-20 DIAGNOSIS — F325 Major depressive disorder, single episode, in full remission: Secondary | ICD-10-CM

## 2022-06-20 NOTE — Telephone Encounter (Signed)
Last office visit 01/01/2022  Last filled 01/01/2022

## 2022-06-20 NOTE — Telephone Encounter (Signed)
Lvm for the patient to call back to schedule an appointment for further refills as soon as possible- tvt

## 2022-06-20 NOTE — Telephone Encounter (Signed)
Patient is due for follow-up and does not appear to have an appointment scheduled.  Please contact her to facilitate scheduling so we can prevent delays in further refills.

## 2022-06-21 NOTE — Telephone Encounter (Signed)
Please call to schedule an appt. Was due in May.

## 2022-06-24 NOTE — Telephone Encounter (Signed)
Pt is scheduled tomorrow.

## 2022-06-24 NOTE — Telephone Encounter (Signed)
Called the patient to schedule an appointment for the next refill and she stated that she will call once she gets home to look at her calendar. Tvt

## 2022-06-24 NOTE — Telephone Encounter (Signed)
Please call to schedule an appt, was due in May.

## 2022-06-25 ENCOUNTER — Encounter: Payer: Self-pay | Admitting: Adult Health

## 2022-06-25 ENCOUNTER — Ambulatory Visit: Payer: PPO | Admitting: Adult Health

## 2022-06-25 DIAGNOSIS — F325 Major depressive disorder, single episode, in full remission: Secondary | ICD-10-CM | POA: Diagnosis not present

## 2022-06-25 DIAGNOSIS — F22 Delusional disorders: Secondary | ICD-10-CM | POA: Diagnosis not present

## 2022-06-25 DIAGNOSIS — F5101 Primary insomnia: Secondary | ICD-10-CM | POA: Diagnosis not present

## 2022-06-25 DIAGNOSIS — F411 Generalized anxiety disorder: Secondary | ICD-10-CM | POA: Diagnosis not present

## 2022-06-25 MED ORDER — ESCITALOPRAM OXALATE 20 MG PO TABS: ORAL_TABLET | ORAL | 1 refills | Status: DC

## 2022-06-25 NOTE — Progress Notes (Signed)
Breanna Sparks [id number] October 15, 1950 70 y.o.  Subjective:   Patient ID:  Breanna Sparks is a 71 y.o. (DOB [birth year]) female.  Chief Complaint: No chief complaint on file.   HPI Breanna Sparks presents to the office today for follow-up of parasitosis, insomnia, MDD and GAD.  Describes mood today as "ok". Pleasant. Decreased tearfulness. Pleasant. Mood symptoms - denies depression, anxiety and irritability. Denies panic attacks. Decreased worry and rumination. Stating "I'm doing alright". Feels like medications are working well. Has discontinued the Zyprexa. Stable interest and motivation. Taking medications as prescribed.  Energy levels stable. Active, has a regular exercise routine. Enjoys some usual interests and activities. Married. Spending time with family. Married. 1 grown son - grandson.  Appetite adequate. Weight stable - 109 pounds - 66". Sleeps well most nights. Averages 8 hours. Focus and concentration stable. Completing tasks. Managing aspects of household.  Denies SI or HI.  Denies AH or VH.  Previous medication trials:  Seroquel, Lexapro. Lorazepam, Prozac, Cymbalta, Has taken Ativan for sleep previously, but stopped - felt like it was bringing her down.    AUDIT    Flowsheet Row Clinical Support from 11/23/2021 in [facility]  Alcohol Use Disorder Identification Test Final Score (AUDIT) 4      GAD-7    Flowsheet Row Office Visit from 01/01/2022 in [facility] Office Visit from 06/20/2021 in [facility]  Total GAD-7 Score 2 21      PHQ2-9    [place] Office Visit from 01/01/2022 in [facility] from 11/23/2021 in [facility] Visit from 06/20/2021 in [facility] Visit from 05/02/2021 in  [facility] Visit from 02/16/2020 in [facility]  PHQ-2 Total Score 0 0 6 0 0  PHQ-9 Total Score 1 -- 20 0 0        Review of Systems:  Review of Systems  Musculoskeletal:  Negative for gait problem.  Neurological:  Negative for tremors.  Psychiatric/Behavioral:         Please refer to HPI    Medications: I have reviewed the patient's current medications.  Current Outpatient Medications  Medication Sig Dispense Refill   acyclovir cream (ZOVIRAX) 5 % Apply 1 application topically every 3 (three) hours. 15 g 0   Ascorbic Acid (VITAMIN C) 1000 MG tablet SMARTSIG:1 By Mouth     Azelastine HCl 137 MCG/SPRAY SOLN PLACE 2 SPRAYS INTO BOTH NOSTRILS 2 (TWO) TIMES DAILY. USE IN EACH NOSTRIL AS DIRECTED 30 mL 1   B Complex Vitamins (VITAMIN B-COMPLEX) TABS SMARTSIG:1 By Mouth     Cholecalciferol (VITAMIN D-3) 125 MCG (5000 UT) TABS SMARTSIG:1 By Mouth     clobetasol cream (TEMOVATE) 9.92 % Apply 1 application topically 2 (two) times daily. 30 g 0   Cyanocobalamin (VITAMIN B 12 PO) Take by mouth.     cyclobenzaprine (FLEXERIL) 10 MG tablet TAKE 1/2 TO 1 TABLET 2 TO 3 TIMES DAILY AS NEEDED FOR MUSCLE SPASMS 30 tablet 0   dorzolamide-timolol (COSOPT) 22.3-6.8 MG/ML ophthalmic solution INSTILL 1 DROP INTO BOTH EYES EVERY 12 HOURS 10 mL 1   escitalopram (LEXAPRO) 20 MG tablet Take one tablet daily. 90 tablet 1   famotidine (PEPCID) 20 MG tablet Take  1 tablet  2 x / day to Prevent  Heartburn & Indigestion 502 tablet 3   Folic Acid (FOLATE PO) Take by mouth.     latanoprost (XALATAN) 0.005 % ophthalmic solution      levocetirizine (XYZAL) 5 MG tablet TAKE 1 TABLET BY MOUTH EVERY DAY IN THE EVENING 30 tablet 1   levothyroxine (SYNTHROID) 50 MCG tablet TAKE 1 TAB DAILY ON EMPTY STOMACH WITH ONLY WATER FOR 30 MINUTES & NO ANTACID MEDS, CALCIUM OR MAGNESIUM FOR 4 HOURS & AVOID BIOTIN 90 tablet 3   Multiple Vitamin (MULTIVITAMIN  ADULT PO) SMARTSIG:1 By Mouth     Omega-3 Fatty Acids (FISH OIL) 1000 MG CAPS SMARTSIG:1 By Mouth     Potassium 99 MG TABS Take by mouth.     UNABLE TO FIND Med Name: ostaDerm V Topical cream (hormones)     valACYclovir (VALTREX) 1000 MG tablet Take 1 tablet (1,000 mg total) by mouth 2 (two) times daily. 180 tablet 3   No current facility-administered medications for this visit.    Medication Side Effects: None  Allergies:  Allergies  Allergen Reactions   Codeine     REACTION: Itch/nausea/vomiting   Meperidine Hcl     REACTION: itch   Morphine     REACTION: tachycardia   Latex Itching and Rash    Past Medical History:  Diagnosis Date   Anxiety    Arthritis    Cancer (HCC)    skin cancer-melanoma arm   Depression    GERD (gastroesophageal reflux disease)    Glaucoma    History of squamous cell carcinoma    Thyroid disease    TMJ (temporomandibular joint syndrome)    wears gaurd at night    Past Medical History, Surgical history, Social history, and Family history were reviewed and updated as appropriate.   Please see review of systems for further details on the patient's review from today.   Objective:   Physical Exam:  There were no vitals taken for this visit.  Physical Exam Constitutional:      General: She is not in acute distress. Musculoskeletal:        General: No deformity.  Neurological:     Mental Status: She is alert and oriented to person, place, and time.     Coordination: Coordination normal.  Psychiatric:        Attention and Perception: Attention and perception normal. She does not perceive auditory or visual hallucinations.        Mood and Affect: Mood normal. Mood is not anxious or depressed. Affect is not labile, blunt, angry or inappropriate.        Speech: Speech normal.        Behavior: Behavior normal.        Thought Content: Thought content normal. Thought content is not paranoid or delusional. Thought content does not include  homicidal or suicidal ideation. Thought content does not include homicidal or suicidal plan.        Cognition and Memory: Cognition and memory normal.        Judgment: Judgment normal.     Comments: Insight intact     Lab Review:     Component Value Date/Time   NA 136 05/02/2021 1145   K 4.4 05/02/2021 1145   CL 105 05/02/2021 1145   CO2 25 05/02/2021 1145   GLUCOSE 90 05/02/2021 1145   BUN 21 05/02/2021 1145   CREATININE 0.69 05/02/2021 1145   CALCIUM 9.0 05/02/2021 1145   PROT 6.6 05/02/2021 1145   ALBUMIN 4.0 01/21/2017 1533  AST 28 05/02/2021 1145   ALT 27 05/02/2021 1145   ALKPHOS 48 01/21/2017 1533   BILITOT 0.4 05/02/2021 1145   GFRNONAA 89 02/22/2021 1135   GFRAA 104 02/22/2021 1135       Component Value Date/Time   WBC 5.0 05/02/2021 1145   RBC 3.47 (L) 05/02/2021 1145   HGB 12.7 05/02/2021 1145   HCT 38.1 05/02/2021 1145   PLT 241 05/02/2021 1145   MCV 109.8 (H) 05/02/2021 1145   MCH 36.6 (H) 05/02/2021 1145   MCHC 33.3 05/02/2021 1145   RDW 11.7 05/02/2021 1145   LYMPHSABS 1,455 05/02/2021 1145   MONOABS 450 01/21/2017 1533   EOSABS 180 05/02/2021 1145   BASOSABS 20 05/02/2021 1145    No results found for: "POCLITH", "LITHIUM"   No results found for: "PHENYTOIN", "PHENOBARB", "VALPROATE", "CBMZ"   .res Assessment: Plan:    Plan:  PDMP reviewed  Lexapro '20mg'$  daily D/C Zyprexa '5mg'$  - take one tablet at hs - patient has stopped taking.  Discussed potential metabolic side effects associated with atypical antipsychotics, as well as potential risk for movement side effects. Advised pt to contact office if movement side effects occur.  Discussed potential metabolic side effects associated with atypical antipsychotics, as well as potential risk for movement side effects. Advised pt to contact office if movement side effects occur.      RTC 6 months  Patient advised to contact office with any questions, adverse effects, or acute worsening in signs  and symptoms.  Diagnoses and all orders for this visit:  Delusions of parasitosis ([place])  Depression, major, in remission ([place]) -     escitalopram (LEXAPRO) 20 MG tablet; Take one tablet daily.  Generalized anxiety disorder  Primary insomnia     Please see After Visit Summary for patient specific instructions.  Future Appointments  Date Time Provider [place]  07/25/2022  9:50 AM [name], NP [facility] None  09/25/2022 12:00 PM [facility] DX DEXA 1 [facility]  11/26/2022  3:00 PM PCK-NURSE HEALTH ADVISOR [facility] None    No orders of the defined types were placed in this encounter.   -------------------------------

## 2022-07-25 ENCOUNTER — Ambulatory Visit (INDEPENDENT_AMBULATORY_CARE_PROVIDER_SITE_OTHER): Payer: PPO | Admitting: Medical-Surgical

## 2022-07-25 ENCOUNTER — Encounter: Payer: Self-pay | Admitting: Medical-Surgical

## 2022-07-25 VITALS — BP 133/72 | HR 67 | Resp 20 | Ht 66.0 in | Wt 111.6 lb

## 2022-07-25 DIAGNOSIS — E785 Hyperlipidemia, unspecified: Secondary | ICD-10-CM

## 2022-07-25 DIAGNOSIS — E039 Hypothyroidism, unspecified: Secondary | ICD-10-CM

## 2022-07-25 DIAGNOSIS — Z0000 Encounter for general adult medical examination without abnormal findings: Secondary | ICD-10-CM | POA: Diagnosis not present

## 2022-07-25 DIAGNOSIS — N3941 Urge incontinence: Secondary | ICD-10-CM | POA: Diagnosis not present

## 2022-07-25 DIAGNOSIS — B009 Herpesviral infection, unspecified: Secondary | ICD-10-CM | POA: Diagnosis not present

## 2022-07-25 DIAGNOSIS — M1990 Unspecified osteoarthritis, unspecified site: Secondary | ICD-10-CM

## 2022-07-25 MED ORDER — CYCLOBENZAPRINE HCL 10 MG PO TABS: ORAL_TABLET | ORAL | 1 refills | Status: DC

## 2022-07-25 MED ORDER — VALACYCLOVIR HCL 1 G PO TABS: 1000.0000 mg | ORAL_TABLET | Freq: Two times a day (BID) | ORAL | 3 refills | Status: DC

## 2022-07-25 MED ORDER — OXYBUTYNIN CHLORIDE ER 5 MG PO TB24: 5.0000 mg | ORAL_TABLET | Freq: Every evening | ORAL | 0 refills | Status: DC | PRN

## 2022-07-25 NOTE — Progress Notes (Signed)
Established Patient Office Visit  Subjective   Patient ID: Breanna Sparks, female   DOB: 27-Apr-1951 Age: 71 y.o. MRN: [id number]   No chief complaint on file.   HPI Pleasant 71 year old female presenting today for the following:  Urinary concerns: Reports that she has been having some issues over the past weeks/months with urinary incontinence.  She has a hard time holding her bladder when she has the urge to urinate.  Also has difficulty when exercising/sneezing/coughing/etc.  No other urinary symptoms to indicate infection.  No fever or chills.  Stays well-hydrated.  Muscle spasm/arthritis: Has been using Flexeril 5-10 mg as needed for muscle pains in her left thoracic back.  She is extremely physically active and exercises regularly which promotes some backache.  The Flexeril helps tremendously and she usually only uses it at night but does not take this every night.  She is requesting a refill.   Objective:    Vitals:   07/25/22 1000  BP: 133/72  Pulse: 67  Resp: 20  Height: '5\' 6"'$  (1.676 m)  Weight: 111 lb 9.6 oz (50.6 kg)  SpO2: 99%  BMI (Calculated): 18.02    Physical Exam Vitals reviewed.  Constitutional:      General: She is not in acute distress.    Appearance: Normal appearance. She is not ill-appearing.  HENT:     Head: Normocephalic and atraumatic.  Cardiovascular:     Rate and Rhythm: Normal rate and regular rhythm.     Pulses: Normal pulses.     Heart sounds: Normal heart sounds.  Pulmonary:     Effort: Pulmonary effort is normal. No respiratory distress.     Breath sounds: Normal breath sounds. No wheezing, rhonchi or rales.  Skin:    General: Skin is warm and dry.  Neurological:     Mental Status: She is alert and oriented to person, place, and time.  Psychiatric:        Mood and Affect: Mood normal.        Behavior: Behavior normal.        Thought Content: Thought content normal.        Judgment: Judgment normal.   No results found for  this or any previous visit (from the past 24 hour(s)).     The ASCVD Risk score (Arnett DK, et al., 2019) failed to calculate for the following reasons:   The valid HDL cholesterol range is 20 to 100 mg/dL   Assessment & Plan:   1. Arthritis Continue sparing use of Flexeril 5-10 mg 3 times daily as needed. - cyclobenzaprine (FLEXERIL) 10 MG tablet; TAKE 1/2 TO 1 TABLET 2 TO 3 TIMES DAILY AS NEEDED FOR MUSCLE SPASMS  Dispense: 90 tablet; Refill: 1  2. HSV-1 (herpes simplex virus 1) infection Continue Valtrex 1000 mg once daily.  With active outbreak, okay to increase to 2 times daily for 5 days then resume 1000 mg daily after resolved. - valACYclovir (VALTREX) 1000 MG tablet; Take 1 tablet (1,000 mg total) by mouth 2 (two) times daily.  Dispense: 90 tablet; Refill: 3  3. Urge incontinence Referring for pelvic physical therapy.  Starting Ditropan 5 mg nightly as needed. - Ambulatory referral to Physical Therapy  4. Preventative health care Due for physical.  Plans to schedule and would like to have her labs done prior to the appointment so we can discuss her results.  Orders entered today.  Ordering DEXA scan for our location as she would like to get in earlier than  her current appointment in December. - DG Bone Density; Future - CBC with Differential/Platelet - COMPLETE METABOLIC PANEL WITH GFR - Lipid panel  5. Hypothyroidism, unspecified type Checking TSH. - TSH  Return for annual physical exam at your convenience.  ___________________________________________ Clearnce Sorrel, DNP, APRN, FNP-BC Primary Care and [place]

## 2022-08-07 ENCOUNTER — Ambulatory Visit (INDEPENDENT_AMBULATORY_CARE_PROVIDER_SITE_OTHER): Payer: PPO

## 2022-08-07 DIAGNOSIS — Z0000 Encounter for general adult medical examination without abnormal findings: Secondary | ICD-10-CM

## 2022-08-07 DIAGNOSIS — M85852 Other specified disorders of bone density and structure, left thigh: Secondary | ICD-10-CM | POA: Diagnosis not present

## 2022-08-07 DIAGNOSIS — Z780 Asymptomatic menopausal state: Secondary | ICD-10-CM

## 2022-08-17 ENCOUNTER — Other Ambulatory Visit: Payer: Self-pay | Admitting: Medical-Surgical

## 2022-08-20 DIAGNOSIS — E039 Hypothyroidism, unspecified: Secondary | ICD-10-CM | POA: Diagnosis not present

## 2022-08-20 DIAGNOSIS — Z0000 Encounter for general adult medical examination without abnormal findings: Secondary | ICD-10-CM | POA: Diagnosis not present

## 2022-08-21 LAB — CBC WITH DIFFERENTIAL/PLATELET
Absolute Monocytes: 424 cells/uL (ref 200–950)
Basophils Absolute: 32 cells/uL (ref 0–200)
Basophils Relative: 0.6 %
Eosinophils Absolute: 270 cells/uL (ref 15–500)
Eosinophils Relative: 5.1 %
HCT: 38.4 % (ref 35.0–45.0)
Hemoglobin: 13.4 g/dL (ref 11.7–15.5)
Lymphs Abs: 1956 cells/uL (ref 850–3900)
MCH: 38 pg — ABNORMAL HIGH (ref 27.0–33.0)
MCHC: 34.9 g/dL (ref 32.0–36.0)
MCV: 108.8 fL — ABNORMAL HIGH (ref 80.0–100.0)
MPV: 9.8 fL (ref 7.5–12.5)
Monocytes Relative: 8 %
Neutro Abs: 2618 cells/uL (ref 1500–7800)
Neutrophils Relative %: 49.4 %
Platelets: 250 10*3/uL (ref 140–400)
RBC: 3.53 10*6/uL — ABNORMAL LOW (ref 3.80–5.10)
RDW: 11.7 % (ref 11.0–15.0)
Total Lymphocyte: 36.9 %
WBC: 5.3 10*3/uL (ref 3.8–10.8)

## 2022-08-21 LAB — COMPLETE METABOLIC PANEL WITH GFR
AG Ratio: 1.8 (calc) (ref 1.0–2.5)
ALT: 22 U/L (ref 6–29)
AST: 24 U/L (ref 10–35)
Albumin: 4.2 g/dL (ref 3.6–5.1)
Alkaline phosphatase (APISO): 45 U/L (ref 37–153)
BUN: 23 mg/dL (ref 7–25)
CO2: 28 mmol/L (ref 20–32)
Calcium: 9 mg/dL (ref 8.6–10.4)
Chloride: 104 mmol/L (ref 98–110)
Creat: 0.69 mg/dL (ref 0.60–1.00)
Globulin: 2.4 g/dL (calc) (ref 1.9–3.7)
Glucose, Bld: 80 mg/dL (ref 65–99)
Potassium: 4.2 mmol/L (ref 3.5–5.3)
Sodium: 138 mmol/L (ref 135–146)
Total Bilirubin: 0.5 mg/dL (ref 0.2–1.2)
Total Protein: 6.6 g/dL (ref 6.1–8.1)
eGFR: 92 mL/min/{1.73_m2} (ref >=60)

## 2022-08-21 LAB — TSH: TSH: 1.8 mIU/L (ref 0.40–4.50)

## 2022-08-21 LAB — LIPID PANEL
Cholesterol: 233 mg/dL — ABNORMAL HIGH (ref <200)
HDL: 94 mg/dL (ref >=50)
LDL Cholesterol (Calc): 118 mg/dL (calc) — ABNORMAL HIGH
Non-HDL Cholesterol (Calc): 139 mg/dL (calc) — ABNORMAL HIGH (ref <130)
Total CHOL/HDL Ratio: 2.5 (calc) (ref <5.0)
Triglycerides: 100 mg/dL (ref <150)

## 2022-08-21 MED ORDER — ATORVASTATIN CALCIUM 10 MG PO TABS: 10.0000 mg | ORAL_TABLET | Freq: Every day | ORAL | 3 refills | Status: DC

## 2022-08-21 NOTE — Addendum Note (Signed)
Addended bySamuel Bouche on: 08/21/2022 01:43 PM   Modules accepted: Orders

## 2022-09-25 ENCOUNTER — Other Ambulatory Visit: Payer: PPO

## 2022-10-10 IMAGING — MG DIGITAL SCREENING BREAST BILAT IMPLANT W/ TOMO W/ CAD
9 of 14 series · 9 of 34 positions shown · non-contrast
Comparison: Previous exam(s).

CLINICAL DATA: Screening.

EXAM:
DIGITAL SCREENING BILATERAL MAMMOGRAM WITH IMPLANTS, CAD AND
TOMOSYNTHESIS
TECHNIQUE: Bilateral screening digital craniocaudal and mediolateral oblique
mammograms were obtained. Bilateral screening digital breast
tomosynthesis was performed. The images were evaluated with
computer-aided detection. Standard and/or implant displaced views
were performed.

[R MLO]
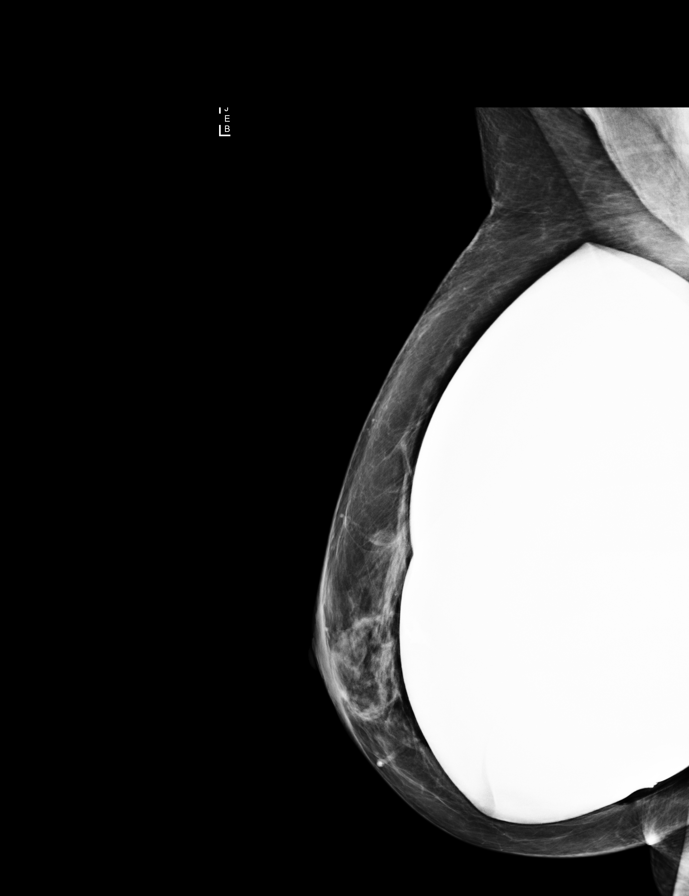

[L CC]
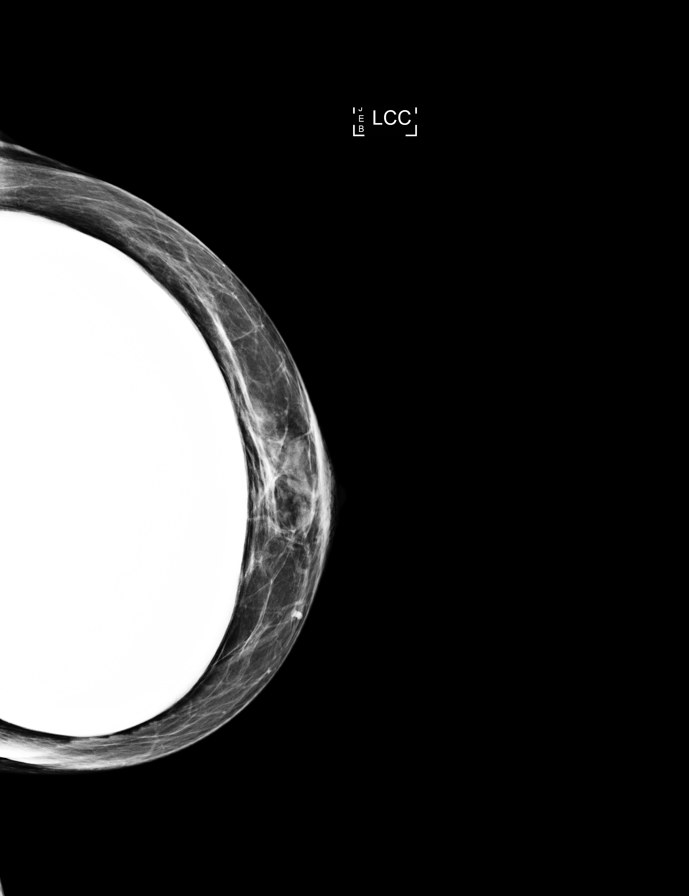

[R CC]
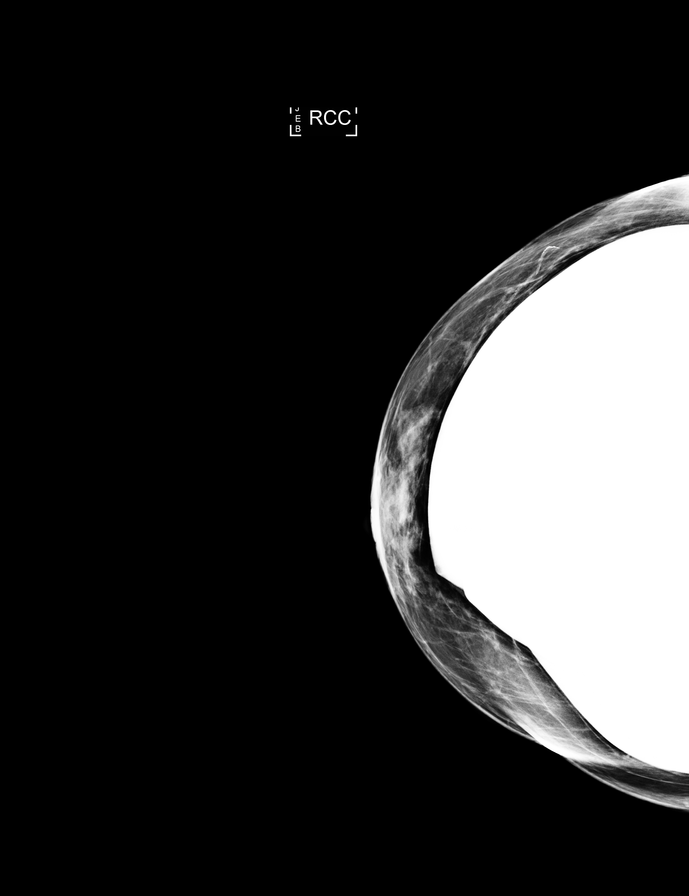

[L MLO]
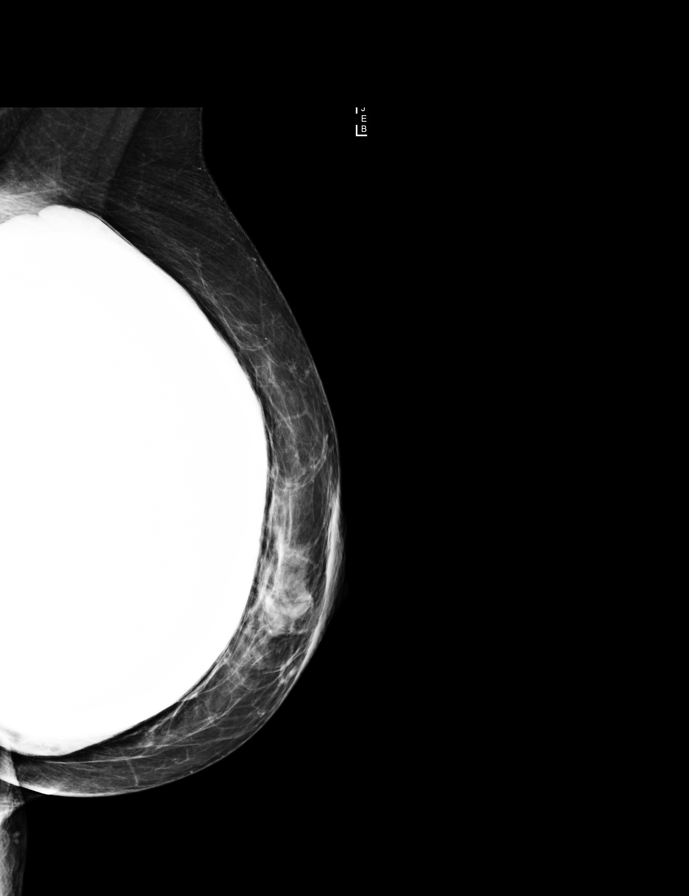

[L MLO synth-2D]
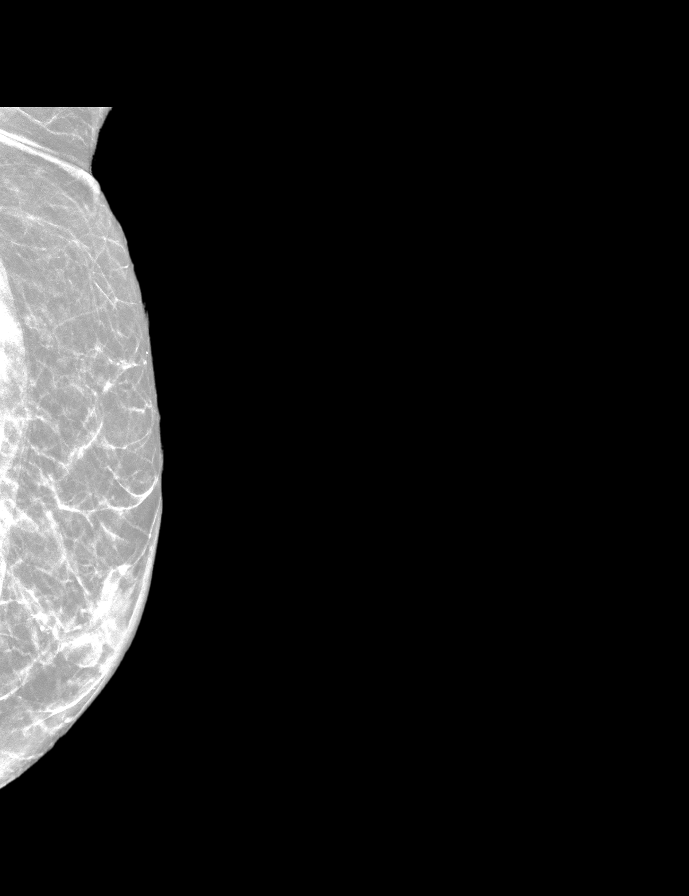

[L CC synth-2D]
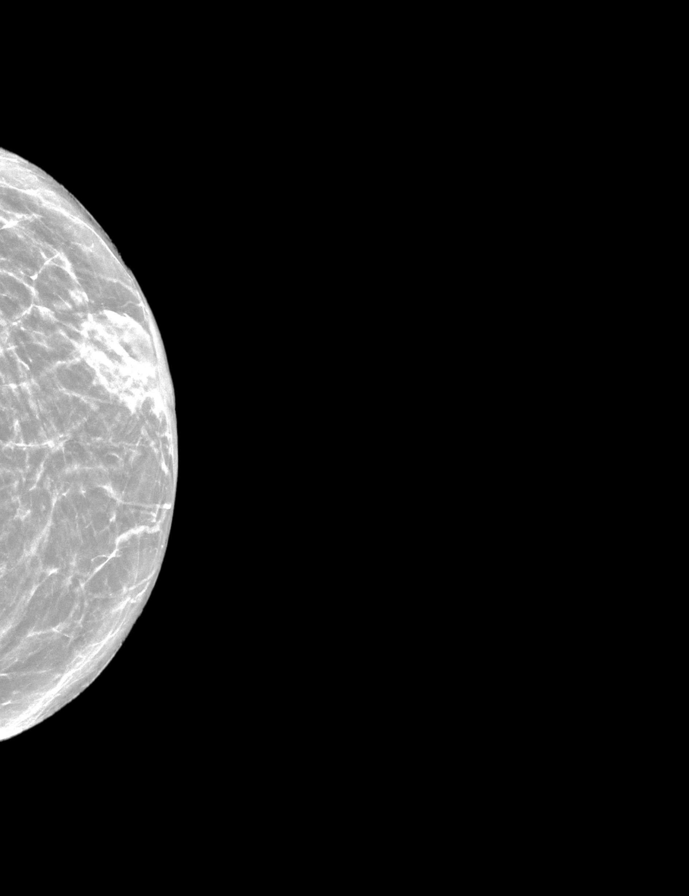

[R CC synth-2D (1 of 2)]
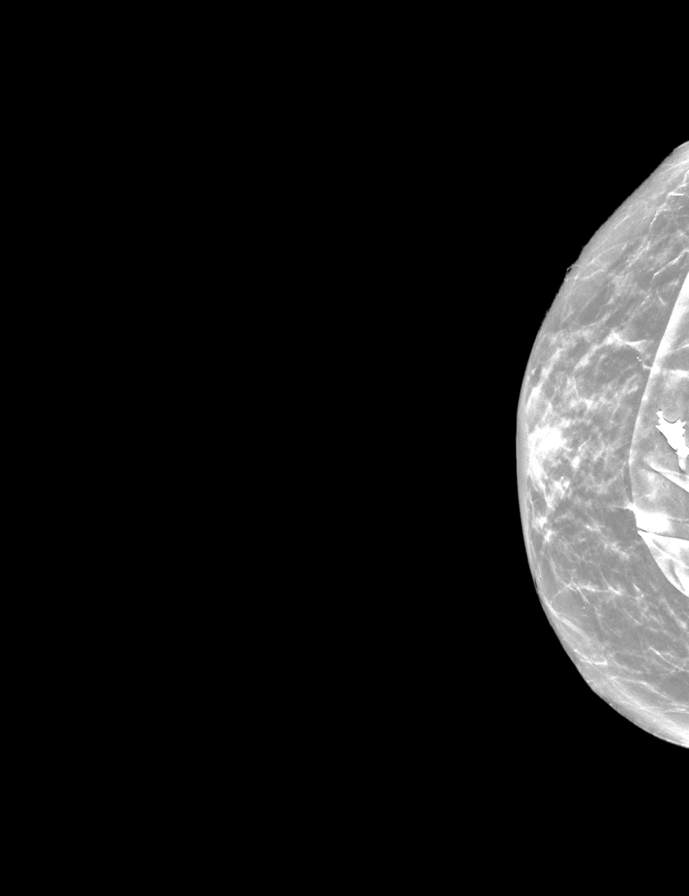

[R MLO synth-2D]
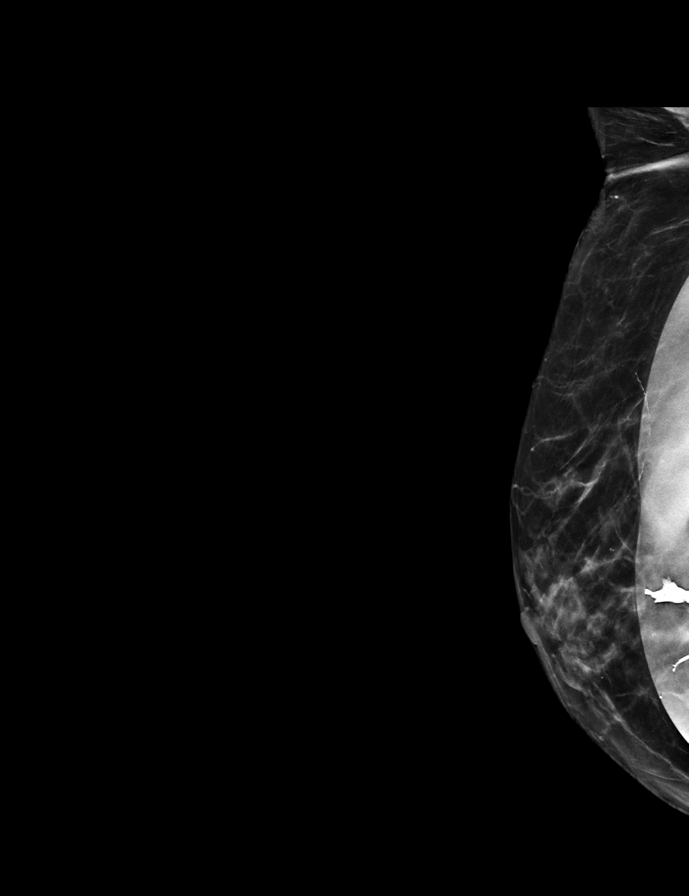

[R CC synth-2D (2 of 2)]
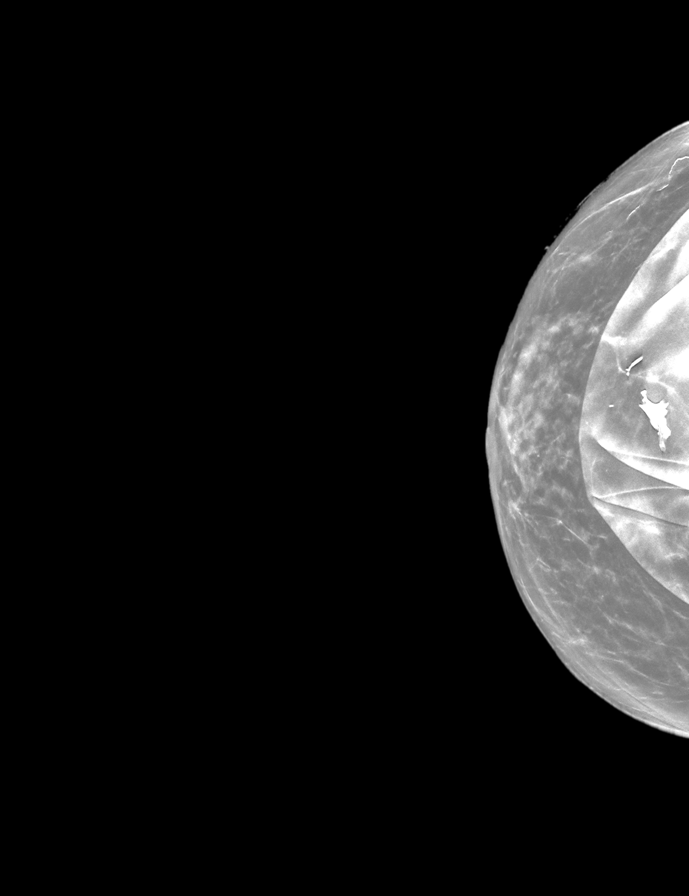

[9 of 34 positions shown; findings below may reference images not displayed]

ACR Breast Density Category b: There are scattered areas of
fibroglandular density.
FINDINGS: The patient has retroglandular implants. There are no findings
suspicious for malignancy.
IMPRESSION: No mammographic evidence of malignancy. A result letter of this
screening mammogram will be mailed directly to the patient.

RECOMMENDATION:
Screening mammogram in one year. (Code:ST-V-2PT)

BI-RADS CATEGORY  1:  Negative.

## 2022-10-22 ENCOUNTER — Other Ambulatory Visit: Payer: Self-pay | Admitting: Medical-Surgical

## 2022-10-22 DIAGNOSIS — H40053 Ocular hypertension, bilateral: Secondary | ICD-10-CM | POA: Diagnosis not present

## 2022-10-22 DIAGNOSIS — M1990 Unspecified osteoarthritis, unspecified site: Secondary | ICD-10-CM

## 2022-11-19 DIAGNOSIS — Z85828 Personal history of other malignant neoplasm of skin: Secondary | ICD-10-CM | POA: Diagnosis not present

## 2022-11-19 DIAGNOSIS — D225 Melanocytic nevi of trunk: Secondary | ICD-10-CM | POA: Diagnosis not present

## 2022-11-19 DIAGNOSIS — C44719 Basal cell carcinoma of skin of left lower limb, including hip: Secondary | ICD-10-CM | POA: Diagnosis not present

## 2022-11-19 DIAGNOSIS — D2271 Melanocytic nevi of right lower limb, including hip: Secondary | ICD-10-CM | POA: Diagnosis not present

## 2022-11-19 DIAGNOSIS — L570 Actinic keratosis: Secondary | ICD-10-CM | POA: Diagnosis not present

## 2022-11-19 DIAGNOSIS — L821 Other seborrheic keratosis: Secondary | ICD-10-CM | POA: Diagnosis not present

## 2022-11-19 DIAGNOSIS — D485 Neoplasm of uncertain behavior of skin: Secondary | ICD-10-CM | POA: Diagnosis not present

## 2022-11-19 DIAGNOSIS — Z85820 Personal history of malignant melanoma of skin: Secondary | ICD-10-CM | POA: Diagnosis not present

## 2022-11-28 ENCOUNTER — Ambulatory Visit (INDEPENDENT_AMBULATORY_CARE_PROVIDER_SITE_OTHER): Payer: PPO | Admitting: Medical-Surgical

## 2022-11-28 DIAGNOSIS — Z1231 Encounter for screening mammogram for malignant neoplasm of breast: Secondary | ICD-10-CM | POA: Diagnosis not present

## 2022-11-28 DIAGNOSIS — Z0000 Encounter for general adult medical examination without abnormal findings: Secondary | ICD-10-CM

## 2022-11-28 NOTE — Patient Instructions (Addendum)
[facility] Maintenance Summary and Written Plan of Care  Breanna Sparks ,  Thank you for allowing me to perform your Medicare Annual Wellness Visit and for your ongoing commitment to your health.   Health Maintenance & Immunization History Health Maintenance  Topic Date Due   DTaP/Tdap/Td (2 - Td or Tdap) 11/13/2022   COVID-19 Vaccine (6 - 2023-24 season) 12/14/2022 (Originally 09/10/2022)   COLONOSCOPY (Pts 45-36yr Insurance coverage will need to be confirmed)  06/11/2023   Medicare Annual Wellness (AWV)  11/29/2023   MAMMOGRAM  12/08/2023   Pneumonia Vaccine 72 Years old  Completed   INFLUENZA VACCINE  Completed   DEXA SCAN  Completed   Hepatitis C Screening  Completed   Zoster Vaccines- Shingrix  Completed   HPV VACCINES  Aged Out   Immunization History  Administered Date(s) Administered   Fluad Quad(high Dose 65+) 07/05/2019, 07/05/2021   Influenza Split 07/02/2013, 07/06/2014   Influenza, High Dose Seasonal PF 06/26/2015, 07/11/2016, 07/31/2017, 07/02/2018   Influenza-Unspecified 07/16/2022   PFIZER(Purple Top)SARS-COV-2 Vaccination 11/30/2019, 12/21/2019, 07/11/2020   Pfizer Covid-19 Vaccine Bivalent Booster 165yr& up 07/27/2021, 07/16/2022   Pneumococcal Conjugate-13 07/25/2015   Pneumococcal Polysaccharide-23 02/03/2014, 02/22/2021   Tdap 11/13/2012   Zoster Recombinat (Shingrix) 07/05/2019, 09/08/2019   Zoster, Live 11/16/2013    These are the patient goals that we discussed:  Goals Addressed               This Visit's Progress     Patient Stated (pt-stated)        11/28/2022 AWV Goal: Exercise for General Health  Patient will verbalize understanding of the benefits of increased physical activity: Exercising regularly is important. It will improve your overall fitness, flexibility, and endurance. Regular exercise also will improve your overall health. It can help you control your weight, reduce stress, and improve your bone  density. Over the next year, patient will increase physical activity as tolerated with a goal of at least 150 minutes of moderate physical activity per week.  You can tell that you are exercising at a moderate intensity if your heart starts beating faster and you start breathing faster but can still hold a conversation. Moderate-intensity exercise ideas include: Walking 1 mile (1.6 km) in about 15 minutes Biking Hiking Golfing Dancing Water aerobics Patient will verbalize understanding of everyday activities that increase physical activity by providing examples like the following: Yard work, such as: PuSales promotion account executiveardening Washing windows or floors Patient will be able to explain general safety guidelines for exercising:  Before you start a new exercise program, talk with your health care provider. Do not exercise so much that you hurt yourself, feel dizzy, or get very short of breath. Wear comfortable clothes and wear shoes with good support. Drink plenty of water while you exercise to prevent dehydration or heat stroke. Work out until your breathing and your heartbeat get faster.          This is a list of Health Maintenance Items that are overdue or due now: Health Maintenance Due  Topic Date Due   DTaP/Tdap/Td (2 - Td or Tdap) 11/13/2022   Td vaccine Screening mammography  Orders/Referrals Placed Today: Orders Placed This Encounter  Procedures   Mammogram 3D SCREEN BREAST BILATERAL    Standing Status:   Future    Standing Expiration Date:   11/29/2023    Scheduling Instructions:  Please call patient to schedule. Last mammogram was on 12/07/21    Order Specific Question:   Reason for Exam (SYMPTOM  OR DIAGNOSIS REQUIRED)    Answer:   breast cancer screening    Order Specific Question:   Preferred imaging location?    Answer:   [facility]   (Contact our referral  department at [phone] if you have not spoken with someone about your referral appointment within the next 5 days)    Follow-up Plan Follow-up with [name], NP as planned Please schedule td shot at the pharmacy.  Mammogram referral has been sent.  Medicare wellness visit in one year.  Patient will access on AVS on my chart.      Health Maintenance, Female Adopting a healthy lifestyle and getting preventive care are important in promoting health and wellness. Ask your health care provider about: The right schedule for you to have regular tests and exams. Things you can do on your own to prevent diseases and keep yourself healthy. What should I know about diet, weight, and exercise? Eat a healthy diet  Eat a diet that includes plenty of vegetables, fruits, low-fat dairy products, and lean protein. Do not eat a lot of foods that are high in solid fats, added sugars, or sodium. Maintain a healthy weight Body mass index (BMI) is used to identify weight problems. It estimates body fat based on height and weight. Your health care provider can help determine your BMI and help you achieve or maintain a healthy weight. Get regular exercise Get regular exercise. This is one of the most important things you can do for your health. Most adults should: Exercise for at least 150 minutes each week. The exercise should increase your heart rate and make you sweat (moderate-intensity exercise). Do strengthening exercises at least twice a week. This is in addition to the moderate-intensity exercise. Spend less time sitting. Even light physical activity can be beneficial. Watch cholesterol and blood lipids Have your blood tested for lipids and cholesterol at 73 years of age, then have this test every 5 years. Have your cholesterol levels checked more often if: Your lipid or cholesterol levels are high. You are older than 73 years of age. You are at high risk for heart disease. What should I  know about cancer screening? Depending on your health history and family history, you may need to have cancer screening at various ages. This may include screening for: Breast cancer. Cervical cancer. Colorectal cancer. Skin cancer. Lung cancer. What should I know about heart disease, diabetes, and high blood pressure? Blood pressure and heart disease High blood pressure causes heart disease and increases the risk of stroke. This is more likely to develop in people who have high blood pressure readings or are overweight. Have your blood pressure checked: Every 3-5 years if you are 29-80 years of age. Every year if you are 16 years old or older. Diabetes Have regular diabetes screenings. This checks your fasting blood sugar level. Have the screening done: Once every three years after age 93 if you are at a normal weight and have a low risk for diabetes. More often and at a younger age if you are overweight or have a high risk for diabetes. What should I know about preventing infection? Hepatitis B If you have a higher risk for hepatitis B, you should be screened for this virus. Talk with your health care provider to find out if you are at risk for hepatitis B infection. Hepatitis C  Testing is recommended for: Everyone born from [birth year] through 1966. Anyone with known risk factors for hepatitis C. Sexually transmitted infections (STIs) Get screened for STIs, including gonorrhea and chlamydia, if: You are sexually active and are younger than 73 years of age. You are older than 73 years of age and your health care provider tells you that you are at risk for this type of infection. Your sexual activity has changed since you were last screened, and you are at increased risk for chlamydia or gonorrhea. Ask your health care provider if you are at risk. Ask your health care provider about whether you are at high risk for HIV. Your health care provider may recommend a prescription medicine to help  prevent HIV infection. If you choose to take medicine to prevent HIV, you should first get tested for HIV. You should then be tested every 3 months for as long as you are taking the medicine. Pregnancy If you are about to stop having your period (premenopausal) and you may become pregnant, seek counseling before you get pregnant. Take 400 to 800 micrograms (mcg) of folic acid every day if you become pregnant. Ask for birth control (contraception) if you want to prevent pregnancy. Osteoporosis and menopause Osteoporosis is a disease in which the bones lose minerals and strength with aging. This can result in bone fractures. If you are 79 years old or older, or if you are at risk for osteoporosis and fractures, ask your health care provider if you should: Be screened for bone loss. Take a calcium or vitamin D supplement to lower your risk of fractures. Be given hormone replacement therapy (HRT) to treat symptoms of menopause. Follow these instructions at home: Alcohol use Do not drink alcohol if: Your health care provider tells you not to drink. You are pregnant, may be pregnant, or are planning to become pregnant. If you drink alcohol: Limit how much you have to: 0-1 drink a day. Know how much alcohol is in your drink. In the [place] one drink equals one 12 oz bottle of beer (355 mL), one 5 oz glass of wine (148 mL), or one 1 oz glass of hard liquor (44 mL). Lifestyle Do not use any products that contain nicotine or tobacco. These products include cigarettes, chewing tobacco, and vaping devices, such as e-cigarettes. If you need help quitting, ask your health care provider. Do not use street drugs. Do not share needles. Ask your health care provider for help if you need support or information about quitting drugs. General instructions Schedule regular health, dental, and eye exams. Stay current with your vaccines. Tell your health care provider if: You often feel depressed. You have ever  been abused or do not feel safe at home. Summary Adopting a healthy lifestyle and getting preventive care are important in promoting health and wellness. Follow your health care provider's instructions about healthy diet, exercising, and getting tested or screened for diseases. Follow your health care provider's instructions on monitoring your cholesterol and blood pressure. This information is not intended to replace advice given to you by your health care provider. Make sure you discuss any questions you have with your health care provider. Document Revised: 02/12/2021 Document Reviewed: 02/12/2021 Elsevier Patient Education  [place].

## 2022-11-28 NOTE — Progress Notes (Signed)
MEDICARE ANNUAL WELLNESS VISIT  11/28/2022  Telephone Visit Disclaimer This Medicare AWV was conducted by telephone due to national recommendations for restrictions regarding the COVID-19 Pandemic (e.g. social distancing).  I verified, using two identifiers, that I am speaking with Breanna Sparks or their authorized healthcare agent. I discussed the limitations, risks, security, and privacy concerns of performing an evaluation and management service by telephone and the potential availability of an in-person appointment in the future. The patient expressed understanding and agreed to proceed.  Location of Patient: Home Location of Provider (nurse):  In the office.  Subjective:    Breanna Sparks is a 72 y.o. female patient of [name], NP who had a Medicare Annual Wellness Visit today via telephone. Breanna Sparks is Retired and lives with their spouse. she has 1 child. she reports that she is socially active and does interact with friends/family regularly. she is minimally physically active and enjoys sewing.  Patient Care Team: [name], NP as PCP - General (Nurse Practitioner) Lafayette Dragon, MD (Inactive) as Consulting Physician (Gastroenterology) Macarthur Critchley, Ackerman as Referring Physician (Optometry) Martinique, Amy, MD as Consulting Physician (Dermatology) Kristeen Miss, MD as Consulting Physician (Neurosurgery)     11/28/2022   10:50 AM 11/23/2021    3:17 PM 06/20/2021   11:28 AM 02/16/2020   12:04 PM 02/16/2020   11:23 AM 02/11/2019   11:15 AM 02/05/2018   10:30 AM  Advanced Directives  Does Patient Have a Medical Advance Directive? Yes Yes No;Yes Yes Yes Yes Yes  Type of Advance Directive Living will Living will;Healthcare Power of [place];Living will [place];Living will [place];Living will [place];Living will  Does patient want to make changes to  medical advance directive? No - Patient declined No - Patient declined No - Patient declined No - Patient declined   No - Patient declined  Copy of [place] in Chart?  No - copy requested   No - copy requested No - copy requested No - copy requested  Would patient like information on creating a medical advance directive?   No - Patient declined        Hospital Utilization Over the Past 12 Months: # of hospitalizations or ER visits: 0 # of surgeries: 0  Review of Systems    Patient reports that her overall health is unchanged compared to last year.  History obtained from chart review and the patient  Patient Reported Readings (BP, Pulse, CBG, Weight, etc) none  Pain Assessment Pain : No/denies pain     Current Medications & Allergies (verified) Allergies as of 11/28/2022       Reactions   Codeine    REACTION: Itch/nausea/vomiting   Meperidine Hcl    REACTION: itch   Morphine    REACTION: tachycardia   Latex Itching, Rash        Medication List        Accurate as of November 28, 2022 11:03 AM. If you have any questions, ask your nurse or doctor.          acyclovir cream 5 % Commonly known as: ZOVIRAX Apply 1 application topically every 3 (three) hours.   atorvastatin 10 MG tablet Commonly known as: LIPITOR Take 1 tablet (10 mg total) by mouth daily.   Azelastine HCl 137 MCG/SPRAY Soln PLACE 2 SPRAYS INTO BOTH NOSTRILS 2 (TWO) TIMES DAILY. USE IN EACH NOSTRIL AS DIRECTED  clobetasol cream 0.05 % Commonly known as: TEMOVATE Apply 1 application topically 2 (two) times daily.   cyclobenzaprine 10 MG tablet Commonly known as: FLEXERIL TAKE 1/2 TO 1 TABLET 2 TO 3 TIMES DAILY AS NEEDED FOR MUSCLE SPASMS   dorzolamide-timolol 2-0.5 % ophthalmic solution Commonly known as: COSOPT INSTILL 1 DROP INTO BOTH EYES EVERY 12 HOURS   escitalopram 20 MG tablet Commonly known as: LEXAPRO Take one tablet daily.   famotidine 20 MG  tablet Commonly known as: PEPCID Take  1 tablet  2 x / day to Prevent Heartburn & Indigestion   Fish Oil 1000 MG Caps SMARTSIG:1 By Mouth   FOLATE PO Take by mouth.   latanoprost 0.005 % ophthalmic solution Commonly known as: XALATAN   levocetirizine 5 MG tablet Commonly known as: XYZAL TAKE 1 TABLET BY MOUTH EVERY DAY IN THE EVENING   levothyroxine 50 MCG tablet Commonly known as: SYNTHROID TAKE 1 TAB DAILY ON EMPTY STOMACH WITH ONLY WATER FOR 30 MINUTES & NO ANTACID MEDS, CALCIUM OR MAGNESIUM FOR 4 HOURS & AVOID BIOTIN   MULTIVITAMIN ADULT PO SMARTSIG:1 By Mouth   oxybutynin 5 MG 24 hr tablet Commonly known as: DITROPAN-XL TAKE 1 TABLET BY MOUTH AT BEDTIME AS NEEDED.   Potassium 99 MG Tabs Take by mouth.   UNABLE TO FIND Med Name: ostaDerm V Topical cream (hormones)   valACYclovir 1000 MG tablet Commonly known as: VALTREX Take 1 tablet (1,000 mg total) by mouth 2 (two) times daily.   VITAMIN B 12 PO Take by mouth.   Vitamin B-Complex Tabs SMARTSIG:1 By Mouth   vitamin C 1000 MG tablet SMARTSIG:1 By Mouth   Vitamin D-3 125 MCG (5000 UT) Tabs SMARTSIG:1 By Mouth        History (reviewed): Past Medical History:  Diagnosis Date   Anxiety    Arthritis    Cancer ([place])    skin cancer-melanoma arm   Depression    GERD (gastroesophageal reflux disease)    Glaucoma    History of squamous cell carcinoma    Thyroid disease    TMJ (temporomandibular joint syndrome)    wears gaurd at night   Past Surgical History:  Procedure Laterality Date   ABDOMINAL HYSTERECTOMY     age 29   ANTERIOR CRUCIATE LIGAMENT REPAIR Bilateral 1998   AUGMENTATION MAMMAPLASTY Bilateral    saline   BREAST SURGERY     implants   CERVICAL FUSION  2012   Dr. Ellene Route   COLONOSCOPY  2009   SQUAMOUS CELL CARCINOMA EXCISION     Multiple   Family History  Problem Relation Age of Onset   Hypertension Mother    Diabetes Mother    Heart disease Mother    Glaucoma Mother     Heart attack Father    Throat cancer Father    COPD Sister    Colon cancer Neg Hx    Esophageal cancer Neg Hx    Rectal cancer Neg Hx    Stomach cancer Neg Hx    Social History   Socioeconomic History   Marital status: Married    Spouse name: Leyda Zevenbergen   Number of children: 1   Years of education: 13   Highest education level: Some college, no degree  Occupational History   Occupation: Retired.  Tobacco Use   Smoking status: Never   Smokeless tobacco: Never  Vaping Use   Vaping Use: Never used  Substance and Sexual Activity   Alcohol use: Yes  Alcohol/week: 4.0 standard drinks of alcohol    Types: 4 Standard drinks or equivalent per week   Drug use: No   Sexual activity: Not on file  Other Topics Concern   Not on file  Social History Narrative   Lives with your husband. She has one son. She enjoys decorating and rearranging.    Social Determinants of Health   Financial Resource Strain: Low Risk  (11/28/2022)   Overall Financial Resource Strain (CARDIA)    Difficulty of Paying Living Expenses: Not hard at all  Food Insecurity: No Food Insecurity (11/28/2022)   Hunger Vital Sign    Worried About Running Out of Food in the Last Year: Never true    Ran Out of Food in the Last Year: Never true  Transportation Needs: No Transportation Needs (11/28/2022)   PRAPARE - Hydrologist (Medical): No    Lack of Transportation (Non-Medical): No  Physical Activity: Inactive (11/28/2022)   Exercise Vital Sign    Days of Exercise per Week: 0 days    Minutes of Exercise per Session: 0 min  Stress: No Stress Concern Present (11/28/2022)   [place]    Feeling of Stress : Not at all  Social Connections: Moderately Isolated (11/28/2022)   Social Connection and Isolation Panel [NHANES]    Frequency of Communication with Friends and Family: More than three times a week    Frequency of  Social Gatherings with Friends and Family: Once a week    Attends Religious Services: Never    Marine scientist or Organizations: No    Attends Archivist Meetings: Never    Marital Status: Married    Activities of Daily Living    11/28/2022   10:53 AM  In your present state of health, do you have any difficulty performing the following activities:  Hearing? 0  Vision? 0  Difficulty concentrating or making decisions? 0  Walking or climbing stairs? 0  Dressing or bathing? 0  Doing errands, shopping? 0  Preparing Food and eating ? N  Using the Toilet? N  In the past six months, have you accidently leaked urine? Y  Comment ocassionally  Do you have problems with loss of bowel control? N  Managing your Medications? N  Managing your Finances? N  Housekeeping or managing your Housekeeping? N    Patient Education/ Literacy How often do you need to have someone help you when you read instructions, pamphlets, or other written materials from your doctor or pharmacy?: 1 - Never What is the last grade level you completed in school?: 12th grade  Exercise Current Exercise Habits: The patient does not participate in regular exercise at present, Exercise limited by: None identified  Diet Patient reports consuming 2 meals a day and 1 snack(s) a day Patient reports that her primary diet is: Regular Patient reports that she does have regular access to food.   Depression Screen    11/28/2022   10:50 AM 01/01/2022    2:22 PM 11/23/2021    3:17 PM 06/20/2021   11:27 AM 05/05/2021   11:29 PM 02/16/2020   12:06 PM 02/11/2019   11:36 AM  PHQ 2/9 Scores  PHQ - 2 Score 0 0 0 6 0 0 0  PHQ- 9 Score  1  20 0 0      Fall Risk    11/28/2022   10:50 AM 01/01/2022    1:15 PM 11/23/2021  3:17 PM 06/20/2021   11:28 AM 05/05/2021   11:30 PM  [place] in the past year? 0 1 1 1 $ 0  Number falls in past yr: 0 0 0 1   Injury with Fall? 0 0 0 0   Risk for fall due to : No Fall  Risks No Fall Risks No Fall Risks History of fall(s) No Fall Risks  Follow up Falls evaluation completed Falls evaluation completed Falls evaluation completed;Education provided Falls evaluation completed Falls evaluation completed;Education provided;Falls prevention discussed     Objective:  Breanna Sparks seemed alert and oriented and she participated appropriately during our telephone visit.  Blood Pressure Weight BMI  BP Readings from Last 3 Encounters:  07/25/22 133/72  01/01/22 131/81  07/05/21 (!) 145/85   Wt Readings from Last 3 Encounters:  07/25/22 111 lb 9.6 oz (50.6 kg)  01/01/22 109 lb (49.4 kg)  07/05/21 97 lb (44 kg)   BMI Readings from Last 1 Encounters:  07/25/22 18.01 kg/m    *Unable to obtain current vital signs, weight, and BMI due to telephone visit type  Hearing/Vision  [place] did not seem to have difficulty with hearing/understanding during the telephone conversation Reports that she has had a formal eye exam by an eye care professional within the past year Reports that she has not had a formal hearing evaluation within the past year *Unable to fully assess hearing and vision during telephone visit type  Cognitive Function:    11/28/2022   10:56 AM 11/23/2021    3:30 PM  6CIT Screen  What Year? 0 points 0 points  What month? 0 points 0 points  What time? 0 points 0 points  Count back from 20 0 points 0 points  Months in reverse 0 points 0 points  Repeat phrase 0 points 0 points  Total Score 0 points 0 points   (Normal:0-7, Significant for Dysfunction: >8)  Normal Cognitive Function Screening: Yes   Immunization & Health Maintenance Record Immunization History  Administered Date(s) Administered   Fluad Quad(high Dose 65+) 07/05/2019, 07/05/2021   Influenza Split 07/02/2013, 07/06/2014   Influenza, High Dose Seasonal PF 06/26/2015, 07/11/2016, 07/31/2017, 07/02/2018   Influenza-Unspecified 07/16/2022   PFIZER(Purple Top)SARS-COV-2  Vaccination 11/30/2019, 12/21/2019, 07/11/2020   Pfizer Covid-19 Vaccine Bivalent Booster 34yr & up 07/27/2021, 07/16/2022   Pneumococcal Conjugate-13 07/25/2015   Pneumococcal Polysaccharide-23 02/03/2014, 02/22/2021   Tdap 11/13/2012   Zoster Recombinat (Shingrix) 07/05/2019, 09/08/2019   Zoster, Live 11/16/2013    Health Maintenance  Topic Date Due   DTaP/Tdap/Td (2 - Td or Tdap) 11/13/2022   COVID-19 Vaccine (6 - 2023-24 season) 12/14/2022 (Originally 09/10/2022)   COLONOSCOPY (Pts 45-473yrInsurance coverage will need to be confirmed)  06/11/2023   Medicare Annual Wellness (AWV)  11/29/2023   MAMMOGRAM  12/08/2023   Pneumonia Vaccine 72Years old  Completed   INFLUENZA VACCINE  Completed   DEXA SCAN  Completed   Hepatitis C Screening  Completed   Zoster Vaccines- Shingrix  Completed   HPV VACCINES  Aged Out       Assessment  This is a routine wellness examination for [facility] [employer] Health Maintenance: Due or Overdue Health Maintenance Due  Topic Date Due   DTaP/Tdap/Td (2 - Td or Tdap) 11/13/2022    Breanna Hooseoes not need a referral for Community Assistance: Care Management:   no Social Work:    no Prescription Assistance:  no Nutrition/Diabetes Education:  no   Plan:  Personalized Goals  Goals Addressed               This Visit's Progress     Patient Stated (pt-stated)        11/28/2022 AWV Goal: Exercise for General Health  Patient will verbalize understanding of the benefits of increased physical activity: Exercising regularly is important. It will improve your overall fitness, flexibility, and endurance. Regular exercise also will improve your overall health. It can help you control your weight, reduce stress, and improve your bone density. Over the next year, patient will increase physical activity as tolerated with a goal of at least 150 minutes of moderate physical activity per week.  You can tell that you are  exercising at a moderate intensity if your heart starts beating faster and you start breathing faster but can still hold a conversation. Moderate-intensity exercise ideas include: Walking 1 mile (1.6 km) in about 15 minutes Biking Hiking Golfing Dancing Water aerobics Patient will verbalize understanding of everyday activities that increase physical activity by providing examples like the following: Yard work, such as: Sales promotion account executive Gardening Washing windows or floors Patient will be able to explain general safety guidelines for exercising:  Before you start a new exercise program, talk with your health care provider. Do not exercise so much that you hurt yourself, feel dizzy, or get very short of breath. Wear comfortable clothes and wear shoes with good support. Drink plenty of water while you exercise to prevent dehydration or heat stroke. Work out until your breathing and your heartbeat get faster.        Personalized Health Maintenance & Screening Recommendations  Td vaccine Screening mammography  Lung Cancer Screening Recommended: no (Low Dose CT Chest recommended if Age 57-80 years, 30 pack-year currently smoking OR have quit w/in past 15 years) Hepatitis C Screening recommended: no HIV Screening recommended: no  Advanced Directives: Written information was not prepared per patient's request.  Referrals & Orders Orders Placed This Encounter  Procedures   Mammogram 3D SCREEN BREAST BILATERAL    Follow-up Plan Follow-up with [name], NP as planned Please schedule td shot at the pharmacy.  Mammogram referral has been sent.  Medicare wellness visit in one year.  Patient will access on AVS on my chart.   I have personally reviewed and noted the following in the patient's chart:   Medical and social history Use of alcohol, tobacco or illicit drugs  Current medications and  supplements Functional ability and status Nutritional status Physical activity Advanced directives List of other physicians Hospitalizations, surgeries, and ER visits in previous 12 months Vitals Screenings to include cognitive, depression, and falls Referrals and appointments  In addition, I have reviewed and discussed with Breanna Sparks certain preventive protocols, quality metrics, and best practice recommendations. A written personalized care plan for preventive services as well as general preventive health recommendations is available and can be mailed to the patient at her request.      Tinnie Gens, RN BSN  11/28/2022

## 2022-12-17 ENCOUNTER — Encounter: Payer: Self-pay | Admitting: Medical-Surgical

## 2022-12-17 ENCOUNTER — Ambulatory Visit (INDEPENDENT_AMBULATORY_CARE_PROVIDER_SITE_OTHER): Payer: PPO | Admitting: Medical-Surgical

## 2022-12-17 VITALS — BP 112/70 | HR 72 | Resp 20 | Ht 66.0 in | Wt 112.2 lb

## 2022-12-17 DIAGNOSIS — J329 Chronic sinusitis, unspecified: Secondary | ICD-10-CM

## 2022-12-17 DIAGNOSIS — J40 Bronchitis, not specified as acute or chronic: Secondary | ICD-10-CM

## 2022-12-17 MED ORDER — AZITHROMYCIN 250 MG PO TABS: ORAL_TABLET | ORAL | 0 refills | Status: AC

## 2022-12-17 NOTE — Progress Notes (Signed)
Established Patient Office Visit  Subjective   Patient ID: Breanna Sparks, female   DOB: May 07, 1951 Age: 72 y.o. MRN: [id number]   Chief Complaint  Patient presents with   Nasal Congestion   HPI Pleasant 72 year old female presenting today with reports of 1 month of upper respiratory symptoms including cough, chest congestion, sinus congestion, postnasal drip, dizziness, fatigue, chest tightness, and feeling imbalanced.  Reports that her symptoms originally started out as a cold similar to the one her husband had.  Unfortunately, her symptoms did not fully resolve and have lingered over the past few weeks.  She has tried taking an over-the-counter allergy medication which helps a little for a few hours but does not provide lasting relief.   Objective:    Vitals:   12/17/22 1444  BP: 112/70  Pulse: 72  Resp: 20  Height: '5\' 6"'$  (1.676 m)  Weight: 112 lb 3.2 oz (50.9 kg)  SpO2: 98%  BMI (Calculated): 18.12   Physical Exam Vitals reviewed.  Constitutional:      General: She is not in acute distress.    Appearance: Normal appearance. She is normal weight. She is not ill-appearing.  HENT:     Head: Normocephalic and atraumatic.     Right Ear: Tympanic membrane, ear canal and external ear normal. There is no impacted cerumen.     Left Ear: Tympanic membrane, ear canal and external ear normal. There is no impacted cerumen.     Nose: Congestion present. No rhinorrhea.     Mouth/Throat:     Mouth: Mucous membranes are moist.     Pharynx: No oropharyngeal exudate or posterior oropharyngeal erythema.  Eyes:     General: No scleral icterus.       Right eye: No discharge.        Left eye: No discharge.     Extraocular Movements: Extraocular movements intact.     Conjunctiva/sclera: Conjunctivae normal.     Pupils: Pupils are equal, round, and reactive to light.  Cardiovascular:     Rate and Rhythm: Normal rate and regular rhythm.     Pulses: Normal pulses.     Heart  sounds: Normal heart sounds.  Pulmonary:     Effort: Pulmonary effort is normal. No respiratory distress.     Breath sounds: Normal breath sounds. No wheezing, rhonchi or rales.  Musculoskeletal:     Cervical back: Neck supple.  Lymphadenopathy:     Cervical: No cervical adenopathy.  Skin:    General: Skin is warm and dry.  Neurological:     Mental Status: She is alert and oriented to person, place, and time.  Psychiatric:        Mood and Affect: Mood normal.        Behavior: Behavior normal.        Thought Content: Thought content normal.        Judgment: Judgment normal.   No results found for this or any previous visit (from the past 24 hour(s)).     The 10-year ASCVD risk score (Arnett DK, et al., 2019) is: 8.9%   Values used to calculate the score:     Age: 78 years     Sex: Female     Is Non-Hispanic African American: No     Diabetic: No     Tobacco smoker: No     Systolic Blood Pressure: [id number] mmHg     Is BP treated: No     HDL Cholesterol: 94 mg/dL  Total Cholesterol: 233 mg/dL   Assessment & Plan:   1. Sinobronchitis Starting azithromycin for 5-day course.  Discussed a burst of prednisone however she would like to avoid this if possible due to considerable side effects.  Okay to use over-the-counter medications for symptom management.  Continue Xyzal 5 mg daily and azelastine twice daily as needed.  Return if symptoms worsen or fail to improve.  ___________________________________________ Clearnce Sorrel, DNP, APRN, FNP-BC [facility] [place]

## 2022-12-19 ENCOUNTER — Other Ambulatory Visit: Payer: Self-pay | Admitting: Adult Health

## 2022-12-19 DIAGNOSIS — F325 Major depressive disorder, single episode, in full remission: Secondary | ICD-10-CM

## 2022-12-24 ENCOUNTER — Ambulatory Visit: Payer: PPO | Admitting: Adult Health

## 2022-12-24 ENCOUNTER — Other Ambulatory Visit: Payer: PPO

## 2022-12-24 DIAGNOSIS — E785 Hyperlipidemia, unspecified: Secondary | ICD-10-CM | POA: Diagnosis not present

## 2022-12-25 LAB — LIPID PANEL
Cholesterol: 178 mg/dL (ref <200)
HDL: 102 mg/dL (ref >=50)
LDL Cholesterol (Calc): 59 mg/dL (calc)
Non-HDL Cholesterol (Calc): 76 mg/dL (calc) (ref <130)
Total CHOL/HDL Ratio: 1.7 (calc) (ref <5.0)
Triglycerides: 87 mg/dL (ref <150)

## 2022-12-25 LAB — COMPLETE METABOLIC PANEL WITH GFR
AG Ratio: 1.6 (calc) (ref 1.0–2.5)
ALT: 33 U/L — ABNORMAL HIGH (ref 6–29)
AST: 39 U/L — ABNORMAL HIGH (ref 10–35)
Albumin: 4.5 g/dL (ref 3.6–5.1)
Alkaline phosphatase (APISO): 54 U/L (ref 37–153)
BUN: 20 mg/dL (ref 7–25)
CO2: 26 mmol/L (ref 20–32)
Calcium: 9.5 mg/dL (ref 8.6–10.4)
Chloride: 104 mmol/L (ref 98–110)
Creat: 0.79 mg/dL (ref 0.60–1.00)
Globulin: 2.8 g/dL (calc) (ref 1.9–3.7)
Glucose, Bld: 85 mg/dL (ref 65–99)
Potassium: 4.7 mmol/L (ref 3.5–5.3)
Sodium: 139 mmol/L (ref 135–146)
Total Bilirubin: 0.5 mg/dL (ref 0.2–1.2)
Total Protein: 7.3 g/dL (ref 6.1–8.1)
eGFR: 79 mL/min/{1.73_m2} (ref >=60)

## 2023-01-07 ENCOUNTER — Ambulatory Visit: Payer: PPO | Admitting: Adult Health

## 2023-01-08 ENCOUNTER — Ambulatory Visit: Payer: PPO

## 2023-01-29 ENCOUNTER — Other Ambulatory Visit: Payer: Self-pay | Admitting: Adult Health

## 2023-01-29 ENCOUNTER — Other Ambulatory Visit: Payer: Self-pay | Admitting: Medical-Surgical

## 2023-01-29 DIAGNOSIS — F325 Major depressive disorder, single episode, in full remission: Secondary | ICD-10-CM

## 2023-01-29 DIAGNOSIS — K219 Gastro-esophageal reflux disease without esophagitis: Secondary | ICD-10-CM

## 2023-01-29 DIAGNOSIS — M1990 Unspecified osteoarthritis, unspecified site: Secondary | ICD-10-CM

## 2023-01-31 NOTE — Telephone Encounter (Signed)
Needs appt, MyChart message sent.

## 2023-02-12 ENCOUNTER — Ambulatory Visit: Payer: PPO

## 2023-02-15 ENCOUNTER — Other Ambulatory Visit: Payer: Self-pay | Admitting: Internal Medicine

## 2023-02-15 DIAGNOSIS — E039 Hypothyroidism, unspecified: Secondary | ICD-10-CM

## 2023-03-17 ENCOUNTER — Telehealth: Payer: Self-pay | Admitting: Adult Health

## 2023-03-17 DIAGNOSIS — F325 Major depressive disorder, single episode, in full remission: Secondary | ICD-10-CM

## 2023-03-17 NOTE — Telephone Encounter (Signed)
Please call to schedule an appt. Past due.  

## 2023-03-17 NOTE — Telephone Encounter (Signed)
Made appt for 6/18

## 2023-03-17 NOTE — Telephone Encounter (Signed)
LVM to schedule f/u

## 2023-03-19 ENCOUNTER — Ambulatory Visit (INDEPENDENT_AMBULATORY_CARE_PROVIDER_SITE_OTHER): Payer: PPO

## 2023-03-19 DIAGNOSIS — Z1231 Encounter for screening mammogram for malignant neoplasm of breast: Secondary | ICD-10-CM | POA: Diagnosis not present

## 2023-03-19 DIAGNOSIS — Z0000 Encounter for general adult medical examination without abnormal findings: Secondary | ICD-10-CM

## 2023-03-25 ENCOUNTER — Encounter: Payer: Self-pay | Admitting: Adult Health

## 2023-03-25 ENCOUNTER — Ambulatory Visit: Payer: PPO | Admitting: Adult Health

## 2023-03-25 DIAGNOSIS — F411 Generalized anxiety disorder: Secondary | ICD-10-CM

## 2023-03-25 DIAGNOSIS — F5101 Primary insomnia: Secondary | ICD-10-CM

## 2023-03-25 DIAGNOSIS — F22 Delusional disorders: Secondary | ICD-10-CM

## 2023-03-25 DIAGNOSIS — F325 Major depressive disorder, single episode, in full remission: Secondary | ICD-10-CM

## 2023-03-25 NOTE — Progress Notes (Signed)
Breanna Sparks [id number] 1950/12/15 72 y.o.  Subjective:   Patient ID:  Breanna Sparks is a 72 y.o. (DOB 22-Dec-1950) female.  Chief Complaint: No chief complaint on file.   HPI Breanna Sparks presents to the office today for follow-up of delusions of parasitosis, insomnia, MDD and GAD.  Describes mood today as "ok". Pleasant. Decreased tearfulness. Pleasant. Mood symptoms - denies depression, anxiety and irritability. Denies panic attacks. Decreased worry, rumination, and over thinking. Stating "I'm doing good". Feels like medication works well. Stable interest and motivation. Taking medications as prescribed.  Energy levels stable. Active, has a regular exercise routine. Enjoys some usual interests and activities. Married. Spending time with family. Married. 1 grown son - grandson.  Appetite adequate. Weight stable - 113 pounds - 66". Sleeps well most nights. Averages 8 hours. Focus and concentration stable. Completing tasks. Managing aspects of household. Denies SI or HI.  Denies AH or VH. Denies self harm. Denies substance use.  Previous medication trials:  Seroquel, Lexapro. Lorazepam, Prozac, Cymbalta, Has taken Ativan for sleep previously, but stopped - felt like it was bringing her down.    AUDIT    Flowsheet Row Clinical Support from 11/23/2021 in [facility] Primary Care & Sports Medicine at [facility]  Alcohol Use Disorder Identification Test Final Score (AUDIT) 4      GAD-7    Flowsheet Row Office Visit from 01/01/2022 in [facility] Primary Care & Sports Medicine at [facility] Office Visit from 06/20/2021 in [facility] Primary Care & Sports Medicine at [facility]  Total GAD-7 Score 2 21      PHQ2-9    Flowsheet Row Office Visit from 12/17/2022 in [facility] Primary Care & Sports Medicine at [facility] Clinical Support from 11/28/2022 in [facility] Primary Care & Sports Medicine at  [facility] Office Visit from 01/01/2022 in [facility] Primary Care & Sports Medicine at [facility] Clinical Support from 11/23/2021 in [facility] Primary Care & Sports Medicine at [facility] Office Visit from 06/20/2021 in [facility] Primary Care & Sports Medicine at [facility] Total Score 0 0 0 0 6  PHQ-9 Total Score -- -- 1 -- 20        Review of Systems:  Review of Systems  Musculoskeletal:  Negative for gait problem.  Neurological:  Negative for tremors.  Psychiatric/Behavioral:         Please refer to HPI    Medications: I have reviewed the patient's current medications.  Current Outpatient Medications  Medication Sig Dispense Refill   acyclovir cream (ZOVIRAX) 5 % Apply 1 application topically every 3 (three) hours. 15 g 0   Ascorbic Acid (VITAMIN C) 1000 MG tablet SMARTSIG:1 By Mouth     atorvastatin (LIPITOR) 10 MG tablet Take 1 tablet (10 mg total) by mouth daily. 90 tablet 3   Azelastine HCl 137 MCG/SPRAY SOLN PLACE 2 SPRAYS INTO BOTH NOSTRILS 2 (TWO) TIMES DAILY. USE IN EACH NOSTRIL AS DIRECTED 30 mL 1   B Complex Vitamins (VITAMIN B-COMPLEX) TABS SMARTSIG:1 By Mouth     Cholecalciferol (VITAMIN D-3) 125 MCG (5000 UT) TABS SMARTSIG:1 By Mouth     clobetasol cream (TEMOVATE) 0.05 % Apply 1 application topically 2 (two) times daily. 30 g 0   Cyanocobalamin (VITAMIN B 12 PO) Take by mouth.     cyclobenzaprine (FLEXERIL) 10 MG tablet TAKE 1/2 TO 1 TABLET BY MOUTH 2 TO 3 TIMES DAILY AS NEEDED FOR MUSCLE SPASMS 90 tablet  0   dorzolamide-timolol (COSOPT) 22.3-6.8 MG/ML ophthalmic solution INSTILL 1 DROP INTO BOTH EYES EVERY 12 HOURS 10 mL 1   escitalopram (LEXAPRO) 20 MG tablet TAKE 1 TABLET DAILY 30 tablet 0   famotidine (PEPCID) 20 MG tablet TAKE 1 TABLET 2 TIMES A DAY TO PREVENT HEARTBURN & INDIGESTION 180 tablet 0   Folic Acid (FOLATE PO) Take by mouth.     latanoprost (XALATAN) 0.005 % ophthalmic solution       levocetirizine (XYZAL) 5 MG tablet TAKE 1 TABLET BY MOUTH EVERY DAY IN THE EVENING 30 tablet 1   levothyroxine (SYNTHROID) 50 MCG tablet Take  1 tablet  Daily  on an empty stomach with only water for 30 minutes & no Antacid meds, Calcium or Magnesium for 4 hours & avoid Biotin 90 tablet 3   Multiple Vitamin (MULTIVITAMIN ADULT PO) SMARTSIG:1 By Mouth     Omega-3 Fatty Acids (FISH OIL) 1000 MG CAPS SMARTSIG:1 By Mouth     oxybutynin (DITROPAN-XL) 5 MG 24 hr tablet TAKE 1 TABLET BY MOUTH AT BEDTIME AS NEEDED. 90 tablet 1   Potassium 99 MG TABS Take by mouth.     UNABLE TO FIND Med Name: ostaDerm V Topical cream (hormones)     valACYclovir (VALTREX) 1000 MG tablet Take 1 tablet (1,000 mg total) by mouth 2 (two) times daily. 90 tablet 3   No current facility-administered medications for this visit.    Medication Side Effects: None  Allergies:  Allergies  Allergen Reactions   Codeine     REACTION: Itch/nausea/vomiting   Meperidine Hcl     REACTION: itch   Morphine     REACTION: tachycardia   Latex Itching and Rash    Past Medical History:  Diagnosis Date   Anxiety    Arthritis    Cancer (HCC)    skin cancer-melanoma arm   Depression    GERD (gastroesophageal reflux disease)    Glaucoma    History of squamous cell carcinoma    Thyroid disease    TMJ (temporomandibular joint syndrome)    wears gaurd at night    Past Medical History, Surgical history, Social history, and Family history were reviewed and updated as appropriate.   Please see review of systems for further details on the patient's review from today.   Objective:   Physical Exam:  There were no vitals taken for this visit.  Physical Exam Constitutional:      General: She is not in acute distress. Musculoskeletal:        General: No deformity.  Neurological:     Mental Status: She is alert and oriented to person, place, and time.     Coordination: Coordination normal.  Psychiatric:        Attention and  Perception: Attention and perception normal. She does not perceive auditory or visual hallucinations.        Mood and Affect: Mood normal. Mood is not anxious or depressed. Affect is not labile, blunt, angry or inappropriate.        Speech: Speech normal.        Behavior: Behavior normal.        Thought Content: Thought content normal. Thought content is not paranoid or delusional. Thought content does not include homicidal or suicidal ideation. Thought content does not include homicidal or suicidal plan.        Cognition and Memory: Cognition and memory normal.        Judgment: Judgment normal.     Comments:  Insight intact     Lab Review:     Component Value Date/Time   NA 139 12/24/2022 1115   K 4.7 12/24/2022 1115   CL 104 12/24/2022 1115   CO2 26 12/24/2022 1115   GLUCOSE 85 12/24/2022 1115   BUN 20 12/24/2022 1115   CREATININE 0.79 12/24/2022 1115   CALCIUM 9.5 12/24/2022 1115   PROT 7.3 12/24/2022 1115   ALBUMIN 4.0 01/21/2017 1533   AST 39 (H) 12/24/2022 1115   ALT 33 (H) 12/24/2022 1115   ALKPHOS 48 01/21/2017 1533   BILITOT 0.5 12/24/2022 1115   GFRNONAA 89 02/22/2021 1135   GFRAA 104 02/22/2021 1135       Component Value Date/Time   WBC 5.3 08/20/2022 1113   RBC 3.53 (L) 08/20/2022 1113   HGB 13.4 08/20/2022 1113   HCT 38.4 08/20/2022 1113   PLT 250 08/20/2022 1113   MCV 108.8 (H) 08/20/2022 1113   MCH 38.0 (H) 08/20/2022 1113   MCHC 34.9 08/20/2022 1113   RDW 11.7 08/20/2022 1113   LYMPHSABS 1,956 08/20/2022 1113   MONOABS 450 01/21/2017 1533   EOSABS 270 08/20/2022 1113   BASOSABS 32 08/20/2022 1113    No results found for: "POCLITH", "LITHIUM"   No results found for: "PHENYTOIN", "PHENOBARB", "VALPROATE", "CBMZ"   .res Assessment: Plan:    Plan:  PDMP reviewed  Lexapro 20mg  daily  RTC 6 months  Patient advised to contact office with any questions, adverse effects, or acute worsening in signs and symptoms.  Diagnoses and all orders for  this visit:  Delusions of parasitosis (HCC)  Depression, major, in remission (HCC)  Generalized anxiety disorder  Primary insomnia     Please see After Visit Summary for patient specific instructions.  No future appointments.   No orders of the defined types were placed in this encounter.   -------------------------------

## 2023-04-04 ENCOUNTER — Other Ambulatory Visit: Payer: Self-pay | Admitting: Medical-Surgical

## 2023-04-04 DIAGNOSIS — M1990 Unspecified osteoarthritis, unspecified site: Secondary | ICD-10-CM

## 2023-04-14 ENCOUNTER — Other Ambulatory Visit: Payer: Self-pay | Admitting: Adult Health

## 2023-04-14 DIAGNOSIS — F325 Major depressive disorder, single episode, in full remission: Secondary | ICD-10-CM

## 2023-05-15 ENCOUNTER — Other Ambulatory Visit: Payer: Self-pay | Admitting: Medical-Surgical

## 2023-05-15 DIAGNOSIS — M1990 Unspecified osteoarthritis, unspecified site: Secondary | ICD-10-CM

## 2023-06-14 ENCOUNTER — Other Ambulatory Visit: Payer: Self-pay | Admitting: Medical-Surgical

## 2023-06-14 DIAGNOSIS — K219 Gastro-esophageal reflux disease without esophagitis: Secondary | ICD-10-CM

## 2023-06-17 ENCOUNTER — Other Ambulatory Visit: Payer: Self-pay | Admitting: Medical-Surgical

## 2023-06-17 DIAGNOSIS — M1990 Unspecified osteoarthritis, unspecified site: Secondary | ICD-10-CM

## 2023-07-20 ENCOUNTER — Other Ambulatory Visit: Payer: Self-pay | Admitting: Medical-Surgical

## 2023-07-24 DIAGNOSIS — H40053 Ocular hypertension, bilateral: Secondary | ICD-10-CM | POA: Diagnosis not present

## 2023-07-29 ENCOUNTER — Other Ambulatory Visit: Payer: Self-pay | Admitting: Medical-Surgical

## 2023-07-29 DIAGNOSIS — E785 Hyperlipidemia, unspecified: Secondary | ICD-10-CM

## 2023-08-12 ENCOUNTER — Encounter: Payer: Self-pay | Admitting: Gastroenterology

## 2023-08-14 ENCOUNTER — Other Ambulatory Visit: Payer: Self-pay | Admitting: Medical-Surgical

## 2023-08-14 DIAGNOSIS — B009 Herpesviral infection, unspecified: Secondary | ICD-10-CM

## 2023-09-12 ENCOUNTER — Other Ambulatory Visit: Payer: Self-pay | Admitting: Medical-Surgical

## 2023-09-12 DIAGNOSIS — K219 Gastro-esophageal reflux disease without esophagitis: Secondary | ICD-10-CM

## 2023-09-16 ENCOUNTER — Ambulatory Visit: Payer: PPO | Admitting: Medical-Surgical

## 2023-09-23 ENCOUNTER — Encounter: Payer: Self-pay | Admitting: Adult Health

## 2023-09-23 ENCOUNTER — Ambulatory Visit: Payer: PPO | Admitting: Adult Health

## 2023-09-23 DIAGNOSIS — F325 Major depressive disorder, single episode, in full remission: Secondary | ICD-10-CM

## 2023-09-23 DIAGNOSIS — F22 Delusional disorders: Secondary | ICD-10-CM | POA: Diagnosis not present

## 2023-09-23 MED ORDER — ESCITALOPRAM OXALATE 20 MG PO TABS: ORAL_TABLET | ORAL | 5 refills | Status: DC

## 2023-09-23 NOTE — Progress Notes (Signed)
Breanna Sparks [id number] 04-06-1951 72 y.o.  Subjective:   Patient ID:  Breanna Sparks is a 72 y.o. (DOB 1951-02-11) female.  Chief Complaint: No chief complaint on file.   HPI Nube Vanosten presents to the office today for follow-up of delusions of parasitosis and MDD.  Describes mood today as "ok". Pleasant. Decreased tearfulness. Pleasant. Mood symptoms - denies depression, anxiety and irritability. Denies panic attacks. Decreased worry, rumination, and over thinking. Mood is stable. Stating "I feel like I'm doing good". Feels like medication works well. Stable interest and motivation. Taking medications as prescribed.  Energy levels stable. Active, has a regular exercise routine. Enjoys some usual interests and activities. Married. Spending time with family. Married. 1 grown son - grandson.  Appetite adequate. Weight stable - 115 pounds - 66". Sleeps well most nights. Averages 8 hours. Focus and concentration stable. Completing tasks. Managing aspects of household. Denies SI or HI.  Denies AH or VH. Denies self harm. Denies substance use.  Previous medication trials:  Seroquel, Lexapro. Lorazepam, Prozac, Cymbalta, Has taken Ativan for sleep previously, but stopped - felt like it was bringing her down.    AUDIT    Flowsheet Row Clinical Support from 11/23/2021 in [facility] Primary Care & Sports Medicine at [facility]  Alcohol Use Disorder Identification Test Final Score (AUDIT) 4      GAD-7    Flowsheet Row Office Visit from 01/01/2022 in [facility] Primary Care & Sports Medicine at [facility] Office Visit from 06/20/2021 in [facility] Primary Care & Sports Medicine at [facility]  Total GAD-7 Score 2 21      PHQ2-9    Flowsheet Row Office Visit from 12/17/2022 in [facility] Primary Care & Sports Medicine at [facility] Clinical Support from 11/28/2022 in [facility] Primary Care & Sports  Medicine at [facility] Office Visit from 01/01/2022 in [facility] Primary Care & Sports Medicine at [facility] Clinical Support from 11/23/2021 in [facility] Primary Care & Sports Medicine at [facility] Office Visit from 06/20/2021 in [facility] Primary Care & Sports Medicine at [facility] Total Score 0 0 0 0 6  PHQ-9 Total Score -- -- 1 -- 20        Review of Systems:  Review of Systems  Musculoskeletal:  Negative for gait problem.  Neurological:  Negative for tremors.  Psychiatric/Behavioral:         Please refer to HPI    Medications: I have reviewed the patient's current medications.  Current Outpatient Medications  Medication Sig Dispense Refill   acyclovir cream (ZOVIRAX) 5 % Apply 1 application topically every 3 (three) hours. 15 g 0   Ascorbic Acid (VITAMIN C) 1000 MG tablet SMARTSIG:1 By Mouth     atorvastatin (LIPITOR) 10 MG tablet Take 1 tablet (10 mg total) by mouth daily. NEEDS APPOINTMENT FOR FURTHER REFILLS. 90 tablet 0   Azelastine HCl 137 MCG/SPRAY SOLN PLACE 2 SPRAYS INTO BOTH NOSTRILS 2 (TWO) TIMES DAILY. USE IN EACH NOSTRIL AS DIRECTED 30 mL 1   B Complex Vitamins (VITAMIN B-COMPLEX) TABS SMARTSIG:1 By Mouth     Cholecalciferol (VITAMIN D-3) 125 MCG (5000 UT) TABS SMARTSIG:1 By Mouth     clobetasol cream (TEMOVATE) 0.05 % Apply 1 application topically 2 (two) times daily. 30 g 0   Cyanocobalamin (VITAMIN B 12 PO) Take by mouth.     cyclobenzaprine (FLEXERIL) 10 MG tablet TAKE 1/2 TO 1 TABLET 2 TO 3 TIMES DAILY  AS NEEDED FOR MUSCLE SPASMS-NEED APPT FOR MORE REFILLS 30 tablet 0   dorzolamide-timolol (COSOPT) 22.3-6.8 MG/ML ophthalmic solution INSTILL 1 DROP INTO BOTH EYES EVERY 12 HOURS 10 mL 1   escitalopram (LEXAPRO) 20 MG tablet TAKE 1 TABLET DAILY 30 tablet 5   famotidine (PEPCID) 20 MG tablet TAKE 1 TABLET 2 TIMES A DAY TO PREVENT HEARTBURN & INDIGESTION 60 tablet 0   Folic Acid (FOLATE PO) Take by mouth.      latanoprost (XALATAN) 0.005 % ophthalmic solution      levocetirizine (XYZAL) 5 MG tablet TAKE 1 TABLET BY MOUTH EVERY DAY IN THE EVENING 30 tablet 1   levothyroxine (SYNTHROID) 50 MCG tablet Take  1 tablet  Daily  on an empty stomach with only water for 30 minutes & no Antacid meds, Calcium or Magnesium for 4 hours & avoid Biotin 90 tablet 3   Multiple Vitamin (MULTIVITAMIN ADULT PO) SMARTSIG:1 By Mouth     Omega-3 Fatty Acids (FISH OIL) 1000 MG CAPS SMARTSIG:1 By Mouth     oxybutynin (DITROPAN-XL) 5 MG 24 hr tablet Take 1 tablet (5 mg total) by mouth at bedtime as needed. NEEDS APPOINTMENT FOR FURTHER REFILLS. 90 tablet 0   Potassium 99 MG TABS Take by mouth.     UNABLE TO FIND Med Name: ostaDerm V Topical cream (hormones)     valACYclovir (VALTREX) 1000 MG tablet Take 1 tablet (1,000 mg total) by mouth 2 (two) times daily. NEEDS APPOINTMENT FOR FURTHER REFILLS. 180 tablet 0   No current facility-administered medications for this visit.    Medication Side Effects: None  Allergies:  Allergies  Allergen Reactions   Codeine     REACTION: Itch/nausea/vomiting   Meperidine Hcl     REACTION: itch   Morphine     REACTION: tachycardia   Latex Itching and Rash    Past Medical History:  Diagnosis Date   Anxiety    Arthritis    Cancer (HCC)    skin cancer-melanoma arm   Depression    GERD (gastroesophageal reflux disease)    Glaucoma    History of squamous cell carcinoma    Thyroid disease    TMJ (temporomandibular joint syndrome)    wears gaurd at night    Past Medical History, Surgical history, Social history, and Family history were reviewed and updated as appropriate.   Please see review of systems for further details on the patient's review from today.   Objective:   Physical Exam:  There were no vitals taken for this visit.  Physical Exam Constitutional:      General: She is not in acute distress. Musculoskeletal:        General: No deformity.  Neurological:      Mental Status: She is alert and oriented to person, place, and time.     Coordination: Coordination normal.  Psychiatric:        Attention and Perception: Attention and perception normal. She does not perceive auditory or visual hallucinations.        Mood and Affect: Mood normal. Mood is not anxious or depressed. Affect is not labile, blunt, angry or inappropriate.        Speech: Speech normal.        Behavior: Behavior normal.        Thought Content: Thought content normal. Thought content is not paranoid or delusional. Thought content does not include homicidal or suicidal ideation. Thought content does not include homicidal or suicidal plan.  Cognition and Memory: Cognition and memory normal.        Judgment: Judgment normal.     Comments: Insight intact     Lab Review:     Component Value Date/Time   NA 139 12/24/2022 1115   K 4.7 12/24/2022 1115   CL 104 12/24/2022 1115   CO2 26 12/24/2022 1115   GLUCOSE 85 12/24/2022 1115   BUN 20 12/24/2022 1115   CREATININE 0.79 12/24/2022 1115   CALCIUM 9.5 12/24/2022 1115   PROT 7.3 12/24/2022 1115   ALBUMIN 4.0 01/21/2017 1533   AST 39 (H) 12/24/2022 1115   ALT 33 (H) 12/24/2022 1115   ALKPHOS 48 01/21/2017 1533   BILITOT 0.5 12/24/2022 1115   GFRNONAA 89 02/22/2021 1135   GFRAA 104 02/22/2021 1135       Component Value Date/Time   WBC 5.3 08/20/2022 1113   RBC 3.53 (L) 08/20/2022 1113   HGB 13.4 08/20/2022 1113   HCT 38.4 08/20/2022 1113   PLT 250 08/20/2022 1113   MCV 108.8 (H) 08/20/2022 1113   MCH 38.0 (H) 08/20/2022 1113   MCHC 34.9 08/20/2022 1113   RDW 11.7 08/20/2022 1113   LYMPHSABS 1,956 08/20/2022 1113   MONOABS 450 01/21/2017 1533   EOSABS 270 08/20/2022 1113   BASOSABS 32 08/20/2022 1113    No results found for: "POCLITH", "LITHIUM"   No results found for: "PHENYTOIN", "PHENOBARB", "VALPROATE", "CBMZ"   .res Assessment: Plan:    Plan:  PDMP reviewed  Lexapro 20mg  daily  RTC 6  months  Patient advised to contact office with any questions, adverse effects, or acute worsening in signs and symptoms. Diagnoses and all orders for this visit:  Delusions of parasitosis (HCC)  Depression, major, in remission (HCC) -     escitalopram (LEXAPRO) 20 MG tablet; TAKE 1 TABLET DAILY     Please see After Visit Summary for patient specific instructions.  Future Appointments  Date Time Provider Department Center  09/26/2023  1:40 PM Christen Butter, NP [facility] None    No orders of the defined types were placed in this encounter.   -------------------------------

## 2023-09-26 ENCOUNTER — Encounter: Payer: Self-pay | Admitting: Medical-Surgical

## 2023-09-26 ENCOUNTER — Ambulatory Visit (INDEPENDENT_AMBULATORY_CARE_PROVIDER_SITE_OTHER): Payer: PPO | Admitting: Medical-Surgical

## 2023-09-26 VITALS — BP 101/65 | HR 91 | Resp 20 | Ht 66.0 in | Wt 112.0 lb

## 2023-09-26 DIAGNOSIS — J302 Other seasonal allergic rhinitis: Secondary | ICD-10-CM

## 2023-09-26 DIAGNOSIS — L989 Disorder of the skin and subcutaneous tissue, unspecified: Secondary | ICD-10-CM | POA: Diagnosis not present

## 2023-09-26 DIAGNOSIS — K219 Gastro-esophageal reflux disease without esophagitis: Secondary | ICD-10-CM

## 2023-09-26 DIAGNOSIS — N3941 Urge incontinence: Secondary | ICD-10-CM | POA: Diagnosis not present

## 2023-09-26 DIAGNOSIS — E441 Mild protein-calorie malnutrition: Secondary | ICD-10-CM | POA: Diagnosis not present

## 2023-09-26 DIAGNOSIS — E785 Hyperlipidemia, unspecified: Secondary | ICD-10-CM | POA: Diagnosis not present

## 2023-09-26 DIAGNOSIS — D692 Other nonthrombocytopenic purpura: Secondary | ICD-10-CM | POA: Insufficient documentation

## 2023-09-26 MED ORDER — ATORVASTATIN CALCIUM 10 MG PO TABS: 10.0000 mg | ORAL_TABLET | Freq: Every day | ORAL | 3 refills | Status: AC

## 2023-09-26 MED ORDER — LEVOCETIRIZINE DIHYDROCHLORIDE 5 MG PO TABS: 5.0000 mg | ORAL_TABLET | Freq: Every evening | ORAL | 3 refills | Status: DC

## 2023-09-26 MED ORDER — OXYBUTYNIN CHLORIDE ER 10 MG PO TB24: 10.0000 mg | ORAL_TABLET | Freq: Every evening | ORAL | 3 refills | Status: AC | PRN

## 2023-09-26 NOTE — Progress Notes (Signed)
        Established patient visit  History, exam, impression, and plan:  1. Hyperlipidemia, unspecified hyperlipidemia type (Primary) Pleasant 72 year old female presenting today with a history of hyperlipidemia currently treated with atorvastatin 10 mg daily.  She is up-to-date on lipid checks.  Previously exercising regularly but has cut back on this recently.  Plans to get restarted.  Following a low-fat heart healthy diet.  No statin myalgias.  Continue atorvastatin as prescribed. - atorvastatin (LIPITOR) 10 MG tablet; Take 1 tablet (10 mg total) by mouth daily.  Dispense: 90 tablet; Refill: 3  2. Gastroesophageal reflux disease without esophagitis History of GERD that is currently managed with use of as needed Pepcid.  Taking 20 mg twice daily as needed.  Feels that the medication is working well for her and does not currently desire a change in therapy.  Avoiding known food triggers.  Continue Pepcid twice daily as needed.  3. Urge incontinence Was taking oxybutynin 5 mg nightly which was quite beneficial.  She ran out of the medication and notes that the urge incontinence has become a huge problem again.  On the 5 mg dose, she had some breakthrough episodes of urge incontinence and would like to try a little bit higher dose.  This is reasonable so sending in oxybutynin 10 mg nightly. - oxybutynin (DITROPAN-XL) 10 MG 24 hr tablet; Take 1 tablet (10 mg total) by mouth at bedtime as needed.  Dispense: 90 tablet; Refill: 3  4. Seasonal allergic rhinitis, unspecified trigger History of seasonal allergic rhinitis which responds well to levocetirizine 5 mg nightly.  Refilling today. - levocetirizine (XYZAL) 5 MG tablet; Take 1 tablet (5 mg total) by mouth every evening.  Dispense: 90 tablet; Refill: 3  5. Senile purpura (HCC) Complains of continued easy bruising.  Reviewed the nature of senile purpura and discussed prevention.  She has a dermatologist and will follow-up with them for any other  concerns.  6. Mild protein-calorie malnutrition (HCC) Working on following a healthy diet without significant caloric restriction.  Her weight is stable and she does not have any current concerns regarding eating habits.  7. Skin lesion Long history of skin issues and she is followed by dermatology.  Unfortunately, she does not feel that her current dermatology provider is a good connection and is requesting a referral to someone different.  Referral placed today. - Ambulatory referral to Dermatology   Procedures performed this visit: None.  Return in about 6 months (around 03/26/2024) for chronic disease follow up.  __________________________________ Thayer Ohm, DNP, APRN, FNP-BC Primary Care and Sports Medicine [facility] [place]

## 2023-10-19 ENCOUNTER — Other Ambulatory Visit: Payer: Self-pay | Admitting: Medical-Surgical

## 2023-10-19 DIAGNOSIS — K219 Gastro-esophageal reflux disease without esophagitis: Secondary | ICD-10-CM

## 2023-10-28 DIAGNOSIS — L988 Other specified disorders of the skin and subcutaneous tissue: Secondary | ICD-10-CM | POA: Diagnosis not present

## 2023-10-28 DIAGNOSIS — L814 Other melanin hyperpigmentation: Secondary | ICD-10-CM | POA: Diagnosis not present

## 2023-10-28 DIAGNOSIS — Z85820 Personal history of malignant melanoma of skin: Secondary | ICD-10-CM | POA: Diagnosis not present

## 2023-10-28 DIAGNOSIS — L570 Actinic keratosis: Secondary | ICD-10-CM | POA: Diagnosis not present

## 2023-10-28 DIAGNOSIS — D225 Melanocytic nevi of trunk: Secondary | ICD-10-CM | POA: Diagnosis not present

## 2023-10-30 ENCOUNTER — Other Ambulatory Visit: Payer: Self-pay | Admitting: Internal Medicine

## 2023-10-30 DIAGNOSIS — E039 Hypothyroidism, unspecified: Secondary | ICD-10-CM

## 2023-11-06 ENCOUNTER — Other Ambulatory Visit: Payer: Self-pay | Admitting: Medical-Surgical

## 2023-11-06 DIAGNOSIS — M1990 Unspecified osteoarthritis, unspecified site: Secondary | ICD-10-CM

## 2023-11-10 ENCOUNTER — Other Ambulatory Visit: Payer: Self-pay | Admitting: Medical-Surgical

## 2023-11-10 DIAGNOSIS — M1990 Unspecified osteoarthritis, unspecified site: Secondary | ICD-10-CM

## 2023-11-10 NOTE — Telephone Encounter (Signed)
Copied from CRM [id number]. Topic: Clinical - Medication Refill >> Nov 10, 2023 11:46 AM Nila Nephew wrote: Most Recent Primary Care Visit:  Provider: Christen Butter  Department: [facility]  Visit Type: OFFICE VISIT  Date: 09/26/2023  Medication:  cyclobenzaprine (FLEXERIL) 10 MG tablet   Has the patient contacted their pharmacy? Yes - denied and states needs an appointment  Is this the correct pharmacy for this prescription? Yes  This is the patient's preferred pharmacy:  [id number] - [place], [place] - [address] [place] [address] Phone: [phone] Fax: [phone]   Has the prescription been filled recently? No  Is the patient out of the medication? Yes  Has the patient been seen for an appointment in the last year OR does the patient have an upcoming appointment? Yes - Seen 09/26/2023 by PCP  Can we respond through MyChart? No - Phone Call  Agent: Please be advised that Rx refills may take up to 3 business days. We ask that you follow-up with your pharmacy.

## 2023-11-18 ENCOUNTER — Telehealth: Payer: Self-pay

## 2023-11-18 ENCOUNTER — Other Ambulatory Visit: Payer: Self-pay | Admitting: Medical-Surgical

## 2023-11-18 DIAGNOSIS — M1990 Unspecified osteoarthritis, unspecified site: Secondary | ICD-10-CM

## 2023-11-18 MED ORDER — CYCLOBENZAPRINE HCL 10 MG PO TABS: ORAL_TABLET | ORAL | 0 refills | Status: DC

## 2023-11-18 NOTE — Telephone Encounter (Signed)
Copied from CRM [id number]. Topic: Clinical - Prescription Issue >> Nov 18, 2023  4:59 PM Martha Clan wrote: Reason for CRM: cyclobenzaprine (FLEXERIL) 10 MG tablet [[id number]] Patient was told that she was able to get a refill but pharmacy is denying refill. Patient had an appointment with Christen Butter, NP in the last month.

## 2023-12-26 DIAGNOSIS — H04132 Lacrimal cyst, left lacrimal gland: Secondary | ICD-10-CM | POA: Diagnosis not present

## 2023-12-27 ENCOUNTER — Other Ambulatory Visit: Payer: Self-pay | Admitting: Medical-Surgical

## 2023-12-27 DIAGNOSIS — K219 Gastro-esophageal reflux disease without esophagitis: Secondary | ICD-10-CM

## 2024-01-14 ENCOUNTER — Other Ambulatory Visit: Payer: Self-pay | Admitting: Medical-Surgical

## 2024-01-14 DIAGNOSIS — M1990 Unspecified osteoarthritis, unspecified site: Secondary | ICD-10-CM

## 2024-02-21 ENCOUNTER — Other Ambulatory Visit: Payer: Self-pay | Admitting: Medical-Surgical

## 2024-02-21 DIAGNOSIS — E039 Hypothyroidism, unspecified: Secondary | ICD-10-CM

## 2024-02-26 ENCOUNTER — Other Ambulatory Visit: Payer: Self-pay | Admitting: Medical-Surgical

## 2024-02-26 DIAGNOSIS — K219 Gastro-esophageal reflux disease without esophagitis: Secondary | ICD-10-CM

## 2024-03-18 ENCOUNTER — Encounter: Payer: Self-pay | Admitting: Physician Assistant

## 2024-03-18 ENCOUNTER — Other Ambulatory Visit: Payer: Self-pay | Admitting: Medical-Surgical

## 2024-03-18 DIAGNOSIS — Z1231 Encounter for screening mammogram for malignant neoplasm of breast: Secondary | ICD-10-CM

## 2024-03-24 ENCOUNTER — Ambulatory Visit: Payer: PPO | Admitting: Adult Health

## 2024-03-24 DIAGNOSIS — H26491 Other secondary cataract, right eye: Secondary | ICD-10-CM | POA: Diagnosis not present

## 2024-03-24 DIAGNOSIS — H26492 Other secondary cataract, left eye: Secondary | ICD-10-CM | POA: Diagnosis not present

## 2024-03-25 ENCOUNTER — Other Ambulatory Visit: Payer: Self-pay | Admitting: Medical-Surgical

## 2024-03-25 DIAGNOSIS — E039 Hypothyroidism, unspecified: Secondary | ICD-10-CM

## 2024-03-25 DIAGNOSIS — K219 Gastro-esophageal reflux disease without esophagitis: Secondary | ICD-10-CM

## 2024-03-26 ENCOUNTER — Other Ambulatory Visit: Payer: Self-pay | Admitting: Medical-Surgical

## 2024-03-26 DIAGNOSIS — E039 Hypothyroidism, unspecified: Secondary | ICD-10-CM

## 2024-03-26 MED ORDER — LEVOTHYROXINE SODIUM 50 MCG PO TABS: ORAL_TABLET | ORAL | 0 refills | Status: DC

## 2024-03-26 NOTE — Telephone Encounter (Signed)
 Copied from CRM [id number]. Topic: Clinical - Medication Refill >> Mar 26, 2024  9:26 AM Carrielelia G wrote: Medication: levothyroxine  (SYNTHROID ) 50 MCG tablet  Has the patient contacted their pharmacy? Yes (Agent: If no, request that the patient contact the pharmacy for the refill. If patient does not wish to contact the pharmacy document the reason why and proceed with request.) (Agent: If yes, when and what did the pharmacy advise?)  This is the patient's preferred pharmacy:  [id number] - [place], [place] - [address] [place] [address] Phone: [phone] Fax: [phone]  Is this the correct pharmacy for this prescription? Yes If no, delete pharmacy and type the correct one.   Is the patient out of the medication? No (3 days left)   Has the patient been seen for an appointment in the last year OR does the patient have an upcoming appointment? Yes  Can we respond through MyChart? No  Agent: Please be advised that Rx refills may take up to 3 business days. We ask that you follow-up with your pharmacy.

## 2024-03-30 ENCOUNTER — Ambulatory Visit (INDEPENDENT_AMBULATORY_CARE_PROVIDER_SITE_OTHER): Payer: PPO | Admitting: Medical-Surgical

## 2024-03-30 ENCOUNTER — Encounter: Payer: Self-pay | Admitting: Medical-Surgical

## 2024-03-30 VITALS — BP 154/76 | HR 79 | Resp 20 | Ht 66.0 in | Wt 113.0 lb

## 2024-03-30 DIAGNOSIS — E785 Hyperlipidemia, unspecified: Secondary | ICD-10-CM | POA: Diagnosis not present

## 2024-03-30 DIAGNOSIS — K219 Gastro-esophageal reflux disease without esophagitis: Secondary | ICD-10-CM

## 2024-03-30 DIAGNOSIS — E559 Vitamin D deficiency, unspecified: Secondary | ICD-10-CM

## 2024-03-30 DIAGNOSIS — F339 Major depressive disorder, recurrent, unspecified: Secondary | ICD-10-CM

## 2024-03-30 DIAGNOSIS — J449 Chronic obstructive pulmonary disease, unspecified: Secondary | ICD-10-CM

## 2024-03-30 DIAGNOSIS — E039 Hypothyroidism, unspecified: Secondary | ICD-10-CM

## 2024-03-30 DIAGNOSIS — M1990 Unspecified osteoarthritis, unspecified site: Secondary | ICD-10-CM | POA: Diagnosis not present

## 2024-03-30 DIAGNOSIS — F22 Delusional disorders: Secondary | ICD-10-CM

## 2024-03-30 DIAGNOSIS — F411 Generalized anxiety disorder: Secondary | ICD-10-CM

## 2024-03-30 MED ORDER — FAMOTIDINE 20 MG PO TABS
ORAL_TABLET | ORAL | 3 refills | Status: AC
Start: 2024-03-30 — End: ?

## 2024-03-30 MED ORDER — CYCLOBENZAPRINE HCL 10 MG PO TABS: ORAL_TABLET | ORAL | 3 refills | Status: DC

## 2024-03-30 NOTE — Progress Notes (Unsigned)
        Established patient visit  History, exam, impression, and plan:  1. Gastroesophageal reflux disease without esophagitis (Primary) Pleasant 73 year old female presenting today for follow up on chronic diseases. Has a history of GERD which she manages with Pepcid  40mg  taken daily at noon. The medication is working well for her and her symptoms are well controlled. Tolerates the medication well w/o SE. No recent bowel changes or concerning GI symptoms. Continue Pepcid  as prescribed.  - famotidine  (PEPCID ) 20 MG tablet; TAKE 1 TABLET 2 TIMES A DAY TO PREVENT HEARTBURN & INDIGESTION  Dispense: 180 tablet; Refill: 3 - CBC  2. Hypothyroidism, unspecified type Taking levothyroxine  50mcg daily, tolerating well w/o SE. No concerning symptoms, weight fluctuations, hair loss, or palpitations. Due for labs. Checking TSH. Continue levothyroxine  as prescribed with possible titration depending on lab results.  - TSH  3. Hyperlipidemia, unspecified hyperlipidemia type Taking atorvastatin  10 mg daily, tolerating well without side effects.  This medication is working well for her and has no concerns with unusual joint pain, muscle tenderness/weakness, fatigue, or urinary changes.  Following a low-fat heart healthy diet.  Staying active as much as tolerated.  Plan to check labs today.  Continue atorvastatin  with potential dose change depending on results. - CMP14+EGFR - Lipid panel  4. Chronic obstructive pulmonary disease, unspecified COPD type (HCC) Discussed diagnosis of COPD with patient.  This was found on an x-ray and the diagnosis placed in her chart.  She denies any history of smoking or smoke exposure.  Denies any shortness of breath, productive cough, etc.  On exam, respirations are even and unlabored with clear lung sounds throughout.  Unclear if there was spirometry testing completed for a formal diagnosis.  When x-ray was done in the past, she was excessively thin which has been known to present  as false hyperventilation.  Suspect this may be the case but cannot say for sure.  As she is having no issues today, no further investigation but did advise her to monitor for any worsening respiratory symptoms.  At that point, we can certainly do spirometry testing for further evaluation.  5. Vitamin D  deficiency History of vitamin D  deficiency.  She is currently taking vitamin D  D supplement but we will plan to recheck her levels to make sure she is adequately treated today. - VITAMIN D  25 Hydroxy (Vit-D Deficiency, Fractures)  6. Delusions of parasitosis (HCC) 7. Generalized anxiety disorder 8. Depression, recurrent (HCC) Managed by psychiatry but unfortunately, her psychiatrist is moving to another part of the state and leaving the practice.  She is currently stable on Lexapro  20 mg daily with no concerning presentations.  Denies SI/HI.  Mood stable during the appointment with normal affect.  If needed, we can certainly refer her to psychiatry to get her reestablished but I will be happy to manage the Lexapro  unless symptoms become unstable.  9. Arthritis She does have a history of arthritis affecting several joints.  Uses Flexeril  on an as needed basis which she finds very helpful and assists with getting rest at night.  Refilling today. - cyclobenzaprine  (FLEXERIL ) 10 MG tablet; TAKE 1/2 TO 1 TABLET 2 TO 3 TIMES DAILY AS NEEDED FOR MUSCLE SPASMS  Dispense: 90 tablet; Refill: 3  Procedures performed this visit: None.  Return in about 6 months (around 09/29/2024) for chronic disease follow up.  __________________________________ Zada FREDRIK Palin, DNP, APRN, FNP-BC Primary Care and Sports Medicine [facility] [place]

## 2024-03-31 ENCOUNTER — Ambulatory Visit: Payer: Self-pay | Admitting: Medical-Surgical

## 2024-03-31 ENCOUNTER — Telehealth: Payer: Self-pay

## 2024-03-31 ENCOUNTER — Other Ambulatory Visit (HOSPITAL_COMMUNITY): Payer: Self-pay

## 2024-03-31 LAB — VITAMIN D 25 HYDROXY (VIT D DEFICIENCY, FRACTURES): Vit D, 25-Hydroxy: 39.7 ng/mL (ref 30.0–100.0)

## 2024-03-31 LAB — CMP14+EGFR
ALT: 25 IU/L (ref 0–32)
AST: 27 IU/L (ref 0–40)
Albumin: 4.1 g/dL (ref 3.8–4.8)
Alkaline Phosphatase: 56 IU/L (ref 44–121)
BUN/Creatinine Ratio: 23 (ref 12–28)
BUN: 20 mg/dL (ref 8–27)
Bilirubin Total: 0.3 mg/dL (ref 0.0–1.2)
CO2: 18 mmol/L — ABNORMAL LOW (ref 20–29)
Calcium: 9.1 mg/dL (ref 8.7–10.3)
Chloride: 103 mmol/L (ref 96–106)
Creatinine, Ser: 0.86 mg/dL (ref 0.57–1.00)
Globulin, Total: 2.1 g/dL (ref 1.5–4.5)
Glucose: 68 mg/dL — ABNORMAL LOW (ref 70–99)
Potassium: 4.3 mmol/L (ref 3.5–5.2)
Sodium: 138 mmol/L (ref 134–144)
Total Protein: 6.2 g/dL (ref 6.0–8.5)
eGFR: 71 mL/min/{1.73_m2} (ref >59)

## 2024-03-31 LAB — TSH: TSH: 1.47 u[IU]/mL (ref 0.450–4.500)

## 2024-03-31 LAB — LIPID PANEL
Chol/HDL Ratio: 1.9 ratio (ref 0.0–4.4)
Cholesterol, Total: 179 mg/dL (ref 100–199)
HDL: 92 mg/dL (ref >39)
LDL Chol Calc (NIH): 73 mg/dL (ref 0–99)
Triglycerides: 74 mg/dL (ref 0–149)
VLDL Cholesterol Cal: 14 mg/dL (ref 5–40)

## 2024-03-31 LAB — CBC
Hematocrit: 39.1 % (ref 34.0–46.6)
Hemoglobin: 12.9 g/dL (ref 11.1–15.9)
MCH: 35.5 pg — ABNORMAL HIGH (ref 26.6–33.0)
MCHC: 33 g/dL (ref 31.5–35.7)
MCV: 108 fL — ABNORMAL HIGH (ref 79–97)
Platelets: 211 10*3/uL (ref 150–450)
RBC: 3.63 x10E6/uL — ABNORMAL LOW (ref 3.77–5.28)
RDW: 12.4 % (ref 11.7–15.4)
WBC: 6.4 10*3/uL (ref 3.4–10.8)

## 2024-03-31 NOTE — Telephone Encounter (Signed)
 Pharmacy Patient Advocate Encounter   Received notification from CoverMyMeds that prior authorization for Cyclobenzaprine  HCl 10MG  tablets is required/requested.   Insurance verification completed.   The patient is insured through [facility] ADVANTAGE/RX ADVANCE .   Per test claim: PA required; PA submitted to above mentioned insurance via CoverMyMeds Key/confirmation #/EOC [id number] Status is pending

## 2024-04-01 ENCOUNTER — Other Ambulatory Visit (HOSPITAL_COMMUNITY): Payer: Self-pay

## 2024-04-01 NOTE — Telephone Encounter (Signed)
 Pharmacy Patient Advocate Encounter  Received notification from [facility] ADVANTAGE/RX ADVANCE that Prior Authorization for  Cyclobenzaprine  HCl 10MG  tablets  has been APPROVED from 04/01/24 to 04/29/24. Ran test claim, Copay is $2.40. This test claim was processed through [facility]- copay amounts may vary at other pharmacies due to pharmacy/plan contracts, or as the patient moves through the different stages of their insurance plan.   PA #/Case ID/Reference #: [id number]

## 2024-04-06 ENCOUNTER — Telehealth: Admitting: Adult Health

## 2024-04-22 ENCOUNTER — Ambulatory Visit

## 2024-04-22 ENCOUNTER — Other Ambulatory Visit: Payer: Self-pay | Admitting: Medical-Surgical

## 2024-04-22 DIAGNOSIS — E039 Hypothyroidism, unspecified: Secondary | ICD-10-CM

## 2024-05-10 ENCOUNTER — Ambulatory Visit: Admitting: Physician Assistant

## 2024-05-10 ENCOUNTER — Telehealth: Payer: Self-pay

## 2024-05-10 ENCOUNTER — Encounter: Payer: Self-pay | Admitting: Physician Assistant

## 2024-05-10 ENCOUNTER — Other Ambulatory Visit (HOSPITAL_COMMUNITY): Payer: Self-pay

## 2024-05-10 VITALS — BP 118/76 | HR 67 | Ht 64.0 in | Wt 116.0 lb

## 2024-05-10 DIAGNOSIS — Z860100 Personal history of colon polyps, unspecified: Secondary | ICD-10-CM

## 2024-05-10 DIAGNOSIS — Z09 Encounter for follow-up examination after completed treatment for conditions other than malignant neoplasm: Secondary | ICD-10-CM | POA: Diagnosis not present

## 2024-05-10 MED ORDER — NA SULFATE-K SULFATE-MG SULF 17.5-3.13-1.6 GM/177ML PO SOLN: 1.0000 | Freq: Once | ORAL | 0 refills | Status: AC

## 2024-05-10 NOTE — Patient Instructions (Signed)
 You have been scheduled for a colonoscopy. Please follow written instructions given to you at your visit today.   If you use inhalers (even only as needed), please bring them with you on the day of your procedure.  DO NOT TAKE 7 DAYS PRIOR TO TEST- Trulicity (dulaglutide) Ozempic, Wegovy (semaglutide) Mounjaro (tirzepatide) Bydureon Bcise (exanatide extended release)  DO NOT TAKE 1 DAY PRIOR TO YOUR TEST Rybelsus (semaglutide) Adlyxin (lixisenatide) Victoza (liraglutide) Byetta (exanatide) ________________________________________________________________________  _______________________________________________________  If your blood pressure at your visit was 140/90 or greater, please contact your primary care physician to follow up on this.  _______________________________________________________  If you are age 16 or older, your body mass index should be between 23-30. Your Body mass index is 19.91 kg/m. If this is out of the aforementioned range listed, please consider follow up with your Primary Care Provider.  If you are age 22 or younger, your body mass index should be between 19-25. Your Body mass index is 19.91 kg/m. If this is out of the aformentioned range listed, please consider follow up with your Primary Care Provider.   ________________________________________________________  The [facility] providers would like to encourage you to use MYCHART to communicate with providers for non-urgent requests or questions.  Due to long hold times on the telephone, sending your provider a message by [facility] may be a faster and more efficient way to get a response.  Please allow 48 business hours for a response.  Please remember that this is for non-urgent requests.  _______________________________________________________  [facility] is using a team-based approach to care.  Your team is made up of your doctor and two to three APPS. Our APPS (Nurse Practitioners and  Physician Assistants) work with your physician to ensure care continuity for you. They are fully qualified to address your health concerns and develop a treatment plan. They communicate directly with your gastroenterologist to care for you. Seeing the Advanced Practice Practitioners on your physician's team can help you by facilitating care more promptly, often allowing for earlier appointments, access to diagnostic testing, procedures, and other specialty referrals.

## 2024-05-10 NOTE — Progress Notes (Signed)
 Chief Complaint: Discuss colonoscopy  HPI:    Breanna Sparks is a 73 y/o female with a past medical history as listed below including reflux, skin cancer, thyroid  disease and others, known to Dr. Shila, who was referred to me by Willo Mini, NP for a discussion of colonoscopy.    06/10/18 colonoscopy for screening with one 5 mm polyp in the cecum, one 2 mm polyp in the rectum, melanosis in the colon and diverticulosis in the sigmoid and descending colon as well as nonbleeding internal hemorrhoids.  Pathology showed hyperplastic polyps it was discussed that one of the polyps removed from the cecum could be a sessile serrated polyp which could be precancerous so repeat was recommended in 5 years.    03/26/2024 CMP with a glucose low at 68 and otherwise normal.  CBC with no anemia.  TSH and vitamin D  normal.    Today, the patient presents to clinic and tells me that she is overdue for a colonoscopy.  She recalls doing the bowel prep last time and had a hard time getting it all down but denies any nausea or vomiting is just that it was such a large volume for her.  No other GI complaints or concerns today.    Denies fever, chills or weight loss.  Past Medical History:  Diagnosis Date   Anxiety    Arthritis    Cancer (HCC)    skin cancer-melanoma arm   Depression    GERD (gastroesophageal reflux disease)    Glaucoma    History of squamous cell carcinoma    Thyroid  disease    TMJ (temporomandibular joint syndrome)    wears gaurd at night    Past Surgical History:  Procedure Laterality Date   ABDOMINAL HYSTERECTOMY     age 75   ANTERIOR CRUCIATE LIGAMENT REPAIR Bilateral 1998   AUGMENTATION MAMMAPLASTY Bilateral    saline   BREAST SURGERY     implants   CERVICAL FUSION  2012   Dr. Colon   COLONOSCOPY  2009   SQUAMOUS CELL CARCINOMA EXCISION     Multiple    Current Outpatient Medications  Medication Sig Dispense Refill   Ascorbic Acid (VITAMIN C) 1000 MG tablet SMARTSIG:1 By  Mouth     atorvastatin  (LIPITOR) 10 MG tablet Take 1 tablet (10 mg total) by mouth daily. 90 tablet 3   B Complex Vitamins (VITAMIN B-COMPLEX) TABS SMARTSIG:1 By Mouth     Cholecalciferol (VITAMIN D -3) 125 MCG (5000 UT) TABS SMARTSIG:1 By Mouth     clobetasol  cream (TEMOVATE ) 0.05 % Apply 1 application topically 2 (two) times daily. 30 g 0   Cyanocobalamin (VITAMIN B 12 PO) Take by mouth.     cyclobenzaprine  (FLEXERIL ) 10 MG tablet TAKE 1/2 TO 1 TABLET 2 TO 3 TIMES DAILY AS NEEDED FOR MUSCLE SPASMS 90 tablet 3   dorzolamide -timolol  (COSOPT ) 22.3-6.8 MG/ML ophthalmic solution INSTILL 1 DROP INTO BOTH EYES EVERY 12 HOURS 10 mL 1   escitalopram  (LEXAPRO ) 20 MG tablet TAKE 1 TABLET DAILY 30 tablet 5   famotidine  (PEPCID ) 20 MG tablet TAKE 1 TABLET 2 TIMES A DAY TO PREVENT HEARTBURN & INDIGESTION 180 tablet 3   Folic Acid  (FOLATE PO) Take by mouth.     latanoprost (XALATAN) 0.005 % ophthalmic solution      levocetirizine (XYZAL ) 5 MG tablet Take 1 tablet (5 mg total) by mouth every evening. 90 tablet 3   levothyroxine  (SYNTHROID ) 50 MCG tablet TAKE 1 TAB DAILY ON EMPTY STOMACH WITH  ONLY WATER FOR 30 MINUTES. NO ANTACID MEDS, CALCIUM  OR MAGNESIUM FOR 4 HOURS AFTER TAKING. AVOID BIOTIN 30 tablet 0   Multiple Vitamin (MULTIVITAMIN ADULT PO) SMARTSIG:1 By Mouth     Omega-3 Fatty Acids (FISH OIL) 1000 MG CAPS SMARTSIG:1 By Mouth     oxybutynin  (DITROPAN -XL) 10 MG 24 hr tablet Take 1 tablet (10 mg total) by mouth at bedtime as needed. 90 tablet 3   Potassium 99 MG TABS Take by mouth.     UNABLE TO FIND Med Name: ostaDerm V Topical cream (hormones)     acyclovir  cream (ZOVIRAX ) 5 % Apply 1 application topically every 3 (three) hours. 15 g 0   Azelastine  HCl 137 MCG/SPRAY SOLN PLACE 2 SPRAYS INTO BOTH NOSTRILS 2 (TWO) TIMES DAILY. USE IN EACH NOSTRIL AS DIRECTED 30 mL 1   valACYclovir  (VALTREX ) 1000 MG tablet Take 1 tablet (1,000 mg total) by mouth 2 (two) times daily. NEEDS APPOINTMENT FOR FURTHER REFILLS.  180 tablet 0   No current facility-administered medications for this visit.    Allergies as of 05/10/2024 - Review Complete 05/10/2024  Allergen Reaction Noted   Codeine  03/01/2008   Meperidine hcl  03/01/2008   Morphine  03/01/2008   Latex Itching and Rash 05/27/2018    Family History  Problem Relation Age of Onset   Hypertension Mother    Diabetes Mother    Heart disease Mother    Glaucoma Mother    Heart attack Father    Throat cancer Father    COPD Sister    Colon cancer Neg Hx    Esophageal cancer Neg Hx    Rectal cancer Neg Hx    Stomach cancer Neg Hx    Liver disease Neg Hx     Social History   Socioeconomic History   Marital status: Married    Spouse name: Breanna Sparks   Number of children: 1   Years of education: 13   Highest education level: Some college, no degree  Occupational History   Occupation: Retired.  Tobacco Use   Smoking status: Never   Smokeless tobacco: Never  Vaping Use   Vaping status: Never Used  Substance and Sexual Activity   Alcohol use: Yes    Alcohol/week: 4.0 standard drinks of alcohol    Types: 4 Standard drinks or equivalent per week   Drug use: No   Sexual activity: Not on file  Other Topics Concern   Not on file  Social History Narrative   Lives with your husband. She has one son. She enjoys decorating and rearranging.    Social Drivers of Corporate investment banker Strain: Low Risk  (11/28/2022)   Overall Financial Resource Strain (CARDIA)    Difficulty of Paying Living Expenses: Not hard at all  Food Insecurity: No Food Insecurity (11/28/2022)   Hunger Vital Sign    Worried About Running Out of Food in the Last Year: Never true    Ran Out of Food in the Last Year: Never true  Transportation Needs: No Transportation Needs (11/28/2022)   PRAPARE - Administrator, Civil Service (Medical): No    Lack of Transportation (Non-Medical): No  Physical Activity: Inactive (11/28/2022)   Exercise Vital Sign     Days of Exercise per Week: 0 days    Minutes of Exercise per Session: 0 min  Stress: No Stress Concern Present (11/28/2022)   [employer] of Occupational Health - Occupational Stress Questionnaire    Feeling of Stress :  Not at all  Social Connections: Moderately Isolated (11/28/2022)   Social Connection and Isolation Panel    Frequency of Communication with Friends and Family: More than three times a week    Frequency of Social Gatherings with Friends and Family: Once a week    Attends Religious Services: Never    Database administrator or Organizations: No    Attends Banker Meetings: Never    Marital Status: Married  Catering manager Violence: Not At Risk (11/28/2022)   Humiliation, Afraid, Rape, and Kick questionnaire    Fear of Current or Ex-Partner: No    Emotionally Abused: No    Physically Abused: No    Sexually Abused: No    Review of Systems:    Constitutional: No weight loss, fever or chills Skin: No rash  Cardiovascular: No chest pain Respiratory: No SOB Gastrointestinal: See HPI and otherwise negative Genitourinary: No dysuria  Neurological: No headache, dizziness or syncope Musculoskeletal: No new muscle or joint pain Hematologic: No bleeding  Psychiatric: No history of depression or anxiety   Physical Exam:  Vital signs: BP 118/76   Pulse 67   Ht 5' 4 (1.626 m)   Wt 116 lb (52.6 kg)   BMI 19.91 kg/m   Constitutional:   Pleasant elderly Caucasian female appears to be in NAD, Well developed, Well nourished, alert and cooperative Head:  Normocephalic and atraumatic. Eyes:   PEERL, EOMI. No icterus. Conjunctiva pink. Ears:  Normal auditory acuity. Neck:  Supple Throat: Oral cavity and pharynx without inflammation, swelling or lesion.  Respiratory: Respirations even and unlabored. Lungs clear to auscultation bilaterally.   No wheezes, crackles, or rhonchi.  Cardiovascular: Normal S1, S2. No MRG. Regular rate and rhythm. No peripheral edema,  cyanosis or pallor.  Gastrointestinal:  Soft, nondistended, nontender. No rebound or guarding. Normal bowel sounds. No appreciable masses or hepatomegaly. Rectal:  Not performed.  Msk:  Symmetrical without gross deformities. Without edema, no deformity or joint abnormality.  Neurologic:  Alert and  oriented x4;  grossly normal neurologically.  Skin:   Dry and intact without significant lesions or rashes. Psychiatric: Demonstrates good judgement and reason without abnormal affect or behaviors.  RELEVANT LABS AND IMAGING: CBC    Component Value Date/Time   WBC 6.4 03/30/2024 1435   WBC 5.3 08/20/2022 1113   RBC 3.63 (L) 03/30/2024 1435   RBC 3.53 (L) 08/20/2022 1113   HGB 12.9 03/30/2024 1435   HCT 39.1 03/30/2024 1435   PLT 211 03/30/2024 1435   MCV 108 (H) 03/30/2024 1435   MCH 35.5 (H) 03/30/2024 1435   MCH 38.0 (H) 08/20/2022 1113   MCHC 33.0 03/30/2024 1435   MCHC 34.9 08/20/2022 1113   RDW 12.4 03/30/2024 1435   LYMPHSABS 1,956 08/20/2022 1113   MONOABS 450 01/21/2017 1533   EOSABS 270 08/20/2022 1113   BASOSABS 32 08/20/2022 1113    CMP     Component Value Date/Time   NA 138 03/30/2024 1435   K 4.3 03/30/2024 1435   CL 103 03/30/2024 1435   CO2 18 (L) 03/30/2024 1435   GLUCOSE 68 (L) 03/30/2024 1435   GLUCOSE 85 12/24/2022 1115   BUN 20 03/30/2024 1435   CREATININE 0.86 03/30/2024 1435   CREATININE 0.79 12/24/2022 1115   CALCIUM  9.1 03/30/2024 1435   PROT 6.2 03/30/2024 1435   ALBUMIN 4.1 03/30/2024 1435   AST 27 03/30/2024 1435   ALT 25 03/30/2024 1435   ALKPHOS 56 03/30/2024 1435   BILITOT 0.3  03/30/2024 1435   GFRNONAA 89 02/22/2021 1135   GFRAA 104 02/22/2021 1135    Assessment: 1.  History of polyps: Last colonoscopy 06/10/18 with repeat recommended in 5 years due to possibility of a sessile serrated polyp  Plan: 1.  Scheduled patient for a surveillance colonoscopy in [facility] with Dr. Nandigam.  Did provide the patient a detailed list of risks for  procedure and she agrees to proceed. Patient is appropriate for endoscopic procedure(s) in the ambulatory ([facility]) setting.  2.  Patient will have Suprep which is smaller volume than her previous, which will hopefully be helpful in preparation. 3.  Patient to follow in clinic per recommendations after time of procedure.  Delon Failing, PA-C [facility] 05/10/2024, 2:28 PM  Cc: Willo Mini, NP

## 2024-05-10 NOTE — Telephone Encounter (Signed)
 Pharmacy Patient Advocate Encounter   Received notification from CoverMyMeds that prior authorization for Cyclobenzaprine  10mg  tabs is required/requested.   Insurance verification completed.   The patient is insured through [facility] ADVANTAGE/RX ADVANCE .   Per test claim: PA required; PA submitted to above mentioned insurance via CoverMyMeds Key/confirmation #/EOC [id number] Status is pending

## 2024-05-11 ENCOUNTER — Other Ambulatory Visit (HOSPITAL_COMMUNITY): Payer: Self-pay

## 2024-05-11 DIAGNOSIS — D0471 Carcinoma in situ of skin of right lower limb, including hip: Secondary | ICD-10-CM | POA: Diagnosis not present

## 2024-05-11 DIAGNOSIS — L570 Actinic keratosis: Secondary | ICD-10-CM | POA: Diagnosis not present

## 2024-05-11 DIAGNOSIS — Z85820 Personal history of malignant melanoma of skin: Secondary | ICD-10-CM | POA: Diagnosis not present

## 2024-05-11 DIAGNOSIS — Z85828 Personal history of other malignant neoplasm of skin: Secondary | ICD-10-CM | POA: Diagnosis not present

## 2024-05-11 DIAGNOSIS — L578 Other skin changes due to chronic exposure to nonionizing radiation: Secondary | ICD-10-CM | POA: Diagnosis not present

## 2024-05-11 DIAGNOSIS — C44729 Squamous cell carcinoma of skin of left lower limb, including hip: Secondary | ICD-10-CM | POA: Diagnosis not present

## 2024-05-11 DIAGNOSIS — D485 Neoplasm of uncertain behavior of skin: Secondary | ICD-10-CM | POA: Diagnosis not present

## 2024-05-11 NOTE — Telephone Encounter (Signed)
 Pharmacy Patient Advocate Encounter  Received notification from [facility] ADVANTAGE/RX ADVANCE that Prior Authorization for Cyclobenzaprine  10mg  tabs has been APPROVED from 05/10/24 to 06/07/24. Ran test claim, Copay is $2.40. This test claim was processed through [facility]- copay amounts may vary at other pharmacies due to pharmacy/plan contracts, or as the patient moves through the different stages of their insurance plan.   PA #/Case ID/Reference #: [id number]  Left a message at [facility] to notify of the approval

## 2024-05-18 ENCOUNTER — Other Ambulatory Visit: Payer: Self-pay | Admitting: Adult Health

## 2024-05-18 DIAGNOSIS — F325 Major depressive disorder, single episode, in full remission: Secondary | ICD-10-CM

## 2024-05-19 ENCOUNTER — Ambulatory Visit

## 2024-05-22 ENCOUNTER — Other Ambulatory Visit: Payer: Self-pay | Admitting: Medical-Surgical

## 2024-05-22 DIAGNOSIS — E039 Hypothyroidism, unspecified: Secondary | ICD-10-CM

## 2024-05-28 ENCOUNTER — Ambulatory Visit: Admitting: Gastroenterology

## 2024-05-28 ENCOUNTER — Encounter: Payer: Self-pay | Admitting: Gastroenterology

## 2024-05-28 VITALS — BP 133/61 | HR 64 | Temp 97.1°F | Resp 12 | Ht 64.0 in | Wt 116.0 lb

## 2024-05-28 DIAGNOSIS — K644 Residual hemorrhoidal skin tags: Secondary | ICD-10-CM | POA: Diagnosis not present

## 2024-05-28 DIAGNOSIS — Z1211 Encounter for screening for malignant neoplasm of colon: Secondary | ICD-10-CM | POA: Diagnosis not present

## 2024-05-28 DIAGNOSIS — K648 Other hemorrhoids: Secondary | ICD-10-CM

## 2024-05-28 DIAGNOSIS — Z860100 Personal history of colon polyps, unspecified: Secondary | ICD-10-CM

## 2024-05-28 DIAGNOSIS — D122 Benign neoplasm of ascending colon: Secondary | ICD-10-CM

## 2024-05-28 DIAGNOSIS — D123 Benign neoplasm of transverse colon: Secondary | ICD-10-CM

## 2024-05-28 DIAGNOSIS — F32A Depression, unspecified: Secondary | ICD-10-CM | POA: Diagnosis not present

## 2024-05-28 DIAGNOSIS — Z860101 Personal history of adenomatous and serrated colon polyps: Secondary | ICD-10-CM

## 2024-05-28 DIAGNOSIS — F419 Anxiety disorder, unspecified: Secondary | ICD-10-CM | POA: Diagnosis not present

## 2024-05-28 MED ORDER — SODIUM CHLORIDE 0.9 % IV SOLN: 500.0000 mL | INTRAVENOUS | Status: AC

## 2024-05-28 NOTE — Progress Notes (Signed)
 [facility] History and Physical   Primary Care Physician:  Willo Mini, NP   Reason for Procedure:  History of adenomatous colon polyps  Plan:    Surveillance colonoscopy with possible interventions as needed     HPI: Breanna Sparks is a very pleasant 73 y.o. female here for surveillance colonoscopy. Denies any nausea, vomiting, abdominal pain, melena or bright red blood per rectum  The risks and benefits as well as alternatives of endoscopic procedure(s) have been discussed and reviewed. All questions answered. The patient agrees to proceed.    Past Medical History:  Diagnosis Date   Anxiety    Arthritis    Cancer (HCC)    skin cancer-melanoma arm   Depression    GERD (gastroesophageal reflux disease)    Glaucoma    History of squamous cell carcinoma    Thyroid  disease    TMJ (temporomandibular joint syndrome)    wears gaurd at night    Past Surgical History:  Procedure Laterality Date   ABDOMINAL HYSTERECTOMY     age 30   ANTERIOR CRUCIATE LIGAMENT REPAIR Bilateral 1998   AUGMENTATION MAMMAPLASTY Bilateral    saline   BREAST SURGERY     implants   CERVICAL FUSION  2012   Dr. Colon   COLONOSCOPY  2009   SQUAMOUS CELL CARCINOMA EXCISION     Multiple    Prior to Admission medications   Medication Sig Start Date End Date Taking? Authorizing Provider  atorvastatin  (LIPITOR) 10 MG tablet Take 1 tablet (10 mg total) by mouth daily. 09/26/23  Yes Willo Mini, NP  dorzolamide -timolol  (COSOPT ) 22.3-6.8 MG/ML ophthalmic solution INSTILL 1 DROP INTO BOTH EYES EVERY 12 HOURS 03/24/19  Yes Collier, Amanda R, PA-C  escitalopram  (LEXAPRO ) 20 MG tablet TAKE 1 TABLET DAILY 05/18/24  Yes Mozingo, Regina Nattalie, NP  latanoprost (XALATAN) 0.005 % ophthalmic solution  03/29/18  Yes [provider]  levocetirizine (XYZAL ) 5 MG tablet Take 1 tablet (5 mg total) by mouth every evening. 09/26/23  Yes Jessup, Joy, NP  levothyroxine  (SYNTHROID ) 50 MCG  tablet TAKE 1 TAB DAILY ON EMPTY STOMACH WITH ONLY WATER FOR 30 MINUTES. NO ANTACID MEDS, CALCIUM  OR MAGNESIUM FOR 4 HOURS AFTER TAKING. AVOID BIOTIN 05/25/24  Yes Willo Mini, NP  oxybutynin  (DITROPAN -XL) 10 MG 24 hr tablet Take 1 tablet (10 mg total) by mouth at bedtime as needed. 09/26/23  Yes Willo Mini, NP  UNABLE TO FIND Med Name: ostaDerm V Topical cream (hormones)   Yes [provider]  Ascorbic Acid (VITAMIN C) 1000 MG tablet SMARTSIG:1 By Mouth 07/10/21   [provider]  B Complex Vitamins (VITAMIN B-COMPLEX) TABS SMARTSIG:1 By Mouth 07/10/21   [provider]  Cholecalciferol (VITAMIN D -3) 125 MCG (5000 UT) TABS SMARTSIG:1 By Mouth 07/10/21   [provider]  clobetasol  cream (TEMOVATE ) 0.05 % Apply 1 application topically 2 (two) times daily. Patient not taking: Reported on 05/28/2024 07/05/21   Alvia Bring, DO  Cyanocobalamin (VITAMIN B 12 PO) Take by mouth.    [provider]  cyclobenzaprine  (FLEXERIL ) 10 MG tablet TAKE 1/2 TO 1 TABLET 2 TO 3 TIMES DAILY AS NEEDED FOR MUSCLE SPASMS Patient not taking: Reported on 05/28/2024 03/30/24   Willo Mini, NP  famotidine  (PEPCID ) 20 MG tablet TAKE 1 TABLET 2 TIMES A DAY TO PREVENT HEARTBURN & INDIGESTION 03/30/24   Willo Mini, NP  Folic Acid  (FOLATE PO) Take by mouth.    [provider]  Multiple Vitamin (MULTIVITAMIN ADULT PO)  SMARTSIG:1 By Mouth 07/10/21   [provider]  Omega-3 Fatty Acids (FISH OIL) 1000 MG CAPS SMARTSIG:1 By Mouth 07/10/21   [provider]  Potassium 99 MG TABS Take by mouth.    [provider]  valACYclovir  (VALTREX ) 1000 MG tablet Take 1 tablet (1,000 mg total) by mouth 2 (two) times daily. NEEDS APPOINTMENT FOR FURTHER REFILLS. Patient not taking: Reported on 05/28/2024 08/15/23   Willo Mini, NP    Current Outpatient Medications  Medication Sig Dispense Refill   atorvastatin  (LIPITOR) 10 MG tablet Take 1 tablet (10 mg total) by mouth  daily. 90 tablet 3   dorzolamide -timolol  (COSOPT ) 22.3-6.8 MG/ML ophthalmic solution INSTILL 1 DROP INTO BOTH EYES EVERY 12 HOURS 10 mL 1   escitalopram  (LEXAPRO ) 20 MG tablet TAKE 1 TABLET DAILY 30 tablet 0   latanoprost (XALATAN) 0.005 % ophthalmic solution      levocetirizine (XYZAL ) 5 MG tablet Take 1 tablet (5 mg total) by mouth every evening. 90 tablet 3   levothyroxine  (SYNTHROID ) 50 MCG tablet TAKE 1 TAB DAILY ON EMPTY STOMACH WITH ONLY WATER FOR 30 MINUTES. NO ANTACID MEDS, CALCIUM  OR MAGNESIUM FOR 4 HOURS AFTER TAKING. AVOID BIOTIN 30 tablet 0   oxybutynin  (DITROPAN -XL) 10 MG 24 hr tablet Take 1 tablet (10 mg total) by mouth at bedtime as needed. 90 tablet 3   UNABLE TO FIND Med Name: ostaDerm V Topical cream (hormones)     Ascorbic Acid (VITAMIN C) 1000 MG tablet SMARTSIG:1 By Mouth     B Complex Vitamins (VITAMIN B-COMPLEX) TABS SMARTSIG:1 By Mouth     Cholecalciferol (VITAMIN D -3) 125 MCG (5000 UT) TABS SMARTSIG:1 By Mouth     clobetasol  cream (TEMOVATE ) 0.05 % Apply 1 application topically 2 (two) times daily. (Patient not taking: Reported on 05/28/2024) 30 g 0   Cyanocobalamin (VITAMIN B 12 PO) Take by mouth.     cyclobenzaprine  (FLEXERIL ) 10 MG tablet TAKE 1/2 TO 1 TABLET 2 TO 3 TIMES DAILY AS NEEDED FOR MUSCLE SPASMS (Patient not taking: Reported on 05/28/2024) 90 tablet 3   famotidine  (PEPCID ) 20 MG tablet TAKE 1 TABLET 2 TIMES A DAY TO PREVENT HEARTBURN & INDIGESTION 180 tablet 3   Folic Acid  (FOLATE PO) Take by mouth.     Multiple Vitamin (MULTIVITAMIN ADULT PO) SMARTSIG:1 By Mouth     Omega-3 Fatty Acids (FISH OIL) 1000 MG CAPS SMARTSIG:1 By Mouth     Potassium 99 MG TABS Take by mouth.     valACYclovir  (VALTREX ) 1000 MG tablet Take 1 tablet (1,000 mg total) by mouth 2 (two) times daily. NEEDS APPOINTMENT FOR FURTHER REFILLS. (Patient not taking: Reported on 05/28/2024) 180 tablet 0   Current Facility-Administered Medications  Medication Dose Route Frequency Provider Last Rate  Last Admin   0.9 %  sodium chloride  infusion  500 mL Intravenous Continuous Siniya Lichty V, MD        Allergies as of 05/28/2024 - Review Complete 05/28/2024  Allergen Reaction Noted   Codeine Other (See Comments) 03/01/2008   Morphine Other (See Comments) 03/01/2008   Latex Itching and Rash 05/27/2018   Meperidine hcl Itching 03/01/2008    Family History  Problem Relation Age of Onset   Hypertension Mother    Diabetes Mother    Heart disease Mother    Glaucoma Mother    Heart attack Father    Throat cancer Father    COPD Sister    Colon cancer Neg Hx    Esophageal cancer Neg Hx  Rectal cancer Neg Hx    Stomach cancer Neg Hx    Liver disease Neg Hx     Social History   Socioeconomic History   Marital status: Married    Spouse name: Jaelani Posa   Number of children: 1   Years of education: 13   Highest education level: Some college, no degree  Occupational History   Occupation: Retired.  Tobacco Use   Smoking status: Never   Smokeless tobacco: Never  Vaping Use   Vaping status: Never Used  Substance and Sexual Activity   Alcohol use: Yes    Alcohol/week: 4.0 standard drinks of alcohol    Types: 4 Standard drinks or equivalent per week   Drug use: No   Sexual activity: Not on file  Other Topics Concern   Not on file  Social History Narrative   Lives with your husband. She has one son. She enjoys decorating and rearranging.    Social Drivers of Corporate investment banker Strain: Low Risk  (11/28/2022)   Overall Financial Resource Strain (CARDIA)    Difficulty of Paying Living Expenses: Not hard at all  Food Insecurity: No Food Insecurity (11/28/2022)   Hunger Vital Sign    Worried About Running Out of Food in the Last Year: Never true    Ran Out of Food in the Last Year: Never true  Transportation Needs: No Transportation Needs (11/28/2022)   PRAPARE - Administrator, Civil Service (Medical): No    Lack of Transportation  (Non-Medical): No  Physical Activity: Inactive (11/28/2022)   Exercise Vital Sign    Days of Exercise per Week: 0 days    Minutes of Exercise per Session: 0 min  Stress: No Stress Concern Present (11/28/2022)   [employer] of Occupational Health - Occupational Stress Questionnaire    Feeling of Stress : Not at all  Social Connections: Moderately Isolated (11/28/2022)   Social Connection and Isolation Panel    Frequency of Communication with Friends and Family: More than three times a week    Frequency of Social Gatherings with Friends and Family: Once a week    Attends Religious Services: Never    Database administrator or Organizations: No    Attends Banker Meetings: Never    Marital Status: Married  Catering manager Violence: Not At Risk (11/28/2022)   Humiliation, Afraid, Rape, and Kick questionnaire    Fear of Current or Ex-Partner: No    Emotionally Abused: No    Physically Abused: No    Sexually Abused: No    Review of Systems:  All other review of systems negative except as mentioned in the HPI.  Physical Exam: Vital signs in last 24 hours: BP 126/66   Pulse 60   Temp (!) 97.1 F (36.2 C) (Temporal)   Ht 5' 4 (1.626 m)   Wt 116 lb (52.6 kg)   SpO2 98%   BMI 19.91 kg/m  General:   Alert, NAD Lungs:  Clear .   Heart:  Regular rate and rhythm Abdomen:  Soft, nontender and nondistended. Neuro/Psych:  Alert and cooperative. Normal mood and affect. A and O x 3  Reviewed labs, radiology imaging, old records and pertinent past GI work up  Patient is appropriate for planned procedure(s) and anesthesia in an ambulatory setting   K. Veena Damain Broadus , MD [phone]

## 2024-05-28 NOTE — Patient Instructions (Signed)
 YOU HAD AN ENDOSCOPIC PROCEDURE TODAY AT THE [facility]:   Refer to the procedure report that was given to you for any specific questions about what was found during the examination.  If the procedure report does not answer your questions, please call your gastroenterologist to clarify.  If you requested that your care partner not be given the details of your procedure findings, then the procedure report has been included in a sealed envelope for you to review at your convenience later.  YOU SHOULD EXPECT: Some feelings of bloating in the abdomen. Passage of more gas than usual.  Walking can help get rid of the air that was put into your GI tract during the procedure and reduce the bloating. If you had a lower endoscopy (such as a colonoscopy or flexible sigmoidoscopy) you may notice spotting of blood in your stool or on the toilet paper. If you underwent a bowel prep for your procedure, you may not have a normal bowel movement for a few days.  Please Note:  You might notice some irritation and congestion in your nose or some drainage.  This is from the oxygen used during your procedure.  There is no need for concern and it should clear up in a day or so.  SYMPTOMS TO REPORT IMMEDIATELY:  Following lower endoscopy (colonoscopy or flexible sigmoidoscopy):  Excessive amounts of blood in the stool  Significant tenderness or worsening of abdominal pains  Swelling of the abdomen that is new, acute  Fever of 100F or higher   Resume previous diet Continue present medications Await pathology results Repeat colonoscopy in 3-5 years based on pathology results  For urgent or emergent issues, a gastroenterologist can be reached at any hour by calling [phone]. Do not use MyChart messaging for urgent concerns.    DIET:  We do recommend a small meal at first, but then you may proceed to your regular diet.  Drink plenty of fluids but you should avoid alcoholic beverages for 24  hours.  ACTIVITY:  You should plan to take it easy for the rest of today and you should NOT DRIVE or use heavy machinery until tomorrow (because of the sedation medicines used during the test).    FOLLOW UP: Our staff will call the number listed on your records the next business day following your procedure.  We will call around 7:15- 8:00 am to check on you and address any questions or concerns that you may have regarding the information given to you following your procedure. If we do not reach you, we will leave a message.     If any biopsies were taken you will be contacted by phone or by letter within the next 1-3 weeks.  Please call us  at [phone] if you have not heard about the biopsies in 3 weeks.    SIGNATURES/CONFIDENTIALITY: You and/or your care partner have signed paperwork which will be entered into your electronic medical record.  These signatures attest to the fact that that the information above on your After Visit Summary has been reviewed and is understood.  Full responsibility of the confidentiality of this discharge information lies with you and/or your care-partner.

## 2024-05-28 NOTE — Op Note (Signed)
 [facility] Patient Name: Breanna Sparks Procedure Date: 05/28/2024 12:07 PM MRN: [id number] Endoscopist: Gustav ALONSO Mcgee , MD, [id number] Age: 73 Referring MD:  Date of Birth: Nov 14, 1950 Gender: Female Account #: [id number] Procedure:                Colonoscopy Indications:              High risk colon cancer surveillance: Personal                            history of adenoma less than 10 mm in size Medicines:                Monitored Anesthesia Care Procedure:                Pre-Anesthesia Assessment:                           - Prior to the procedure, a History and Physical                            was performed, and patient medications and                            allergies were reviewed. The patient's tolerance of                            previous anesthesia was also reviewed. The risks                            and benefits of the procedure and the sedation                            options and risks were discussed with the patient.                            All questions were answered, and informed consent                            was obtained. Prior Anticoagulants: The patient has                            taken no anticoagulant or antiplatelet agents. ASA                            Grade Assessment: II - A patient with mild systemic                            disease. After reviewing the risks and benefits,                            the patient was deemed in satisfactory condition to                            undergo the procedure.  After obtaining informed consent, the colonoscope                            was passed under direct vision. Throughout the                            procedure, the patient's blood pressure, pulse, and                            oxygen saturations were monitored continuously. The                            Olympus Scope SN [id number] was introduced through the                            anus  and advanced to the the cecum, identified by                            appendiceal orifice and ileocecal valve. The                            colonoscopy was performed without difficulty. The                            patient tolerated the procedure well. The quality                            of the bowel preparation was good. The ileocecal                            valve, appendiceal orifice, and rectum were                            photographed. Scope In: 12:22:05 PM Scope Out: 12:41:37 PM Scope Withdrawal Time: 0 hours 14 minutes 19 seconds  Total Procedure Duration: 0 hours 19 minutes 32 seconds  Findings:                 The perianal and digital rectal examinations were                            normal.                           Five sessile polyps were found in the transverse                            colon and ascending colon. The polyps were 3 to 6                            mm in size. These polyps were removed with a cold                            snare. Resection and retrieval were complete.  Non-bleeding external and internal hemorrhoids were                            found during retroflexion. The hemorrhoids were                            small. Complications:            No immediate complications. Estimated Blood Loss:     Estimated blood loss was minimal. Impression:               - Five 3 to 6 mm polyps in the transverse colon and                            in the ascending colon, removed with a cold snare.                            Resected and retrieved.                           - Non-bleeding external and internal hemorrhoids. Recommendation:           - Patient has a contact number available for                            emergencies. The signs and symptoms of potential                            delayed complications were discussed with the                            patient. Return to normal activities tomorrow.                             Written discharge instructions were provided to the                            patient.                           - Resume previous diet.                           - Continue present medications.                           - Await pathology results.                           - Repeat colonoscopy in 3 - 5 years for                            surveillance based on pathology results. Jailan Trimm V. Vicy Medico, MD 05/28/2024 12:45:51 PM This report has been signed electronically.

## 2024-05-28 NOTE — Progress Notes (Signed)
 Pt's states no medical or surgical changes since previsit or office visit.

## 2024-05-28 NOTE — Progress Notes (Signed)
 Pt A/O x 3, gd SR's, pleased with anesthesia, report to RN

## 2024-05-28 NOTE — Progress Notes (Signed)
 Called to room to assist during endoscopic procedure.  Patient ID and intended procedure confirmed with present staff. Received instructions for my participation in the procedure from the performing physician.

## 2024-05-31 ENCOUNTER — Telehealth: Payer: Self-pay

## 2024-05-31 NOTE — Telephone Encounter (Signed)
  Follow up Call-     05/28/2024   11:20 AM  Call back number  Post procedure Call Back phone  # [phone]  Permission to leave phone message Yes     Patient questions:  Do you have a fever, pain , or abdominal swelling? No. Pain Score  0 *  Have you tolerated food without any problems? Yes.    Have you been able to return to your normal activities? Yes.    Do you have any questions about your discharge instructions: Diet   No. Medications  No. Follow up visit  No.  Do you have questions or concerns about your Care? No.  Actions: * If pain score is 4 or above: No action needed, pain <4.

## 2024-06-01 LAB — SURGICAL PATHOLOGY

## 2024-06-02 ENCOUNTER — Ambulatory Visit

## 2024-06-05 ENCOUNTER — Other Ambulatory Visit: Payer: Self-pay | Admitting: Medical-Surgical

## 2024-06-05 ENCOUNTER — Other Ambulatory Visit: Payer: Self-pay | Admitting: Adult Health

## 2024-06-05 DIAGNOSIS — E039 Hypothyroidism, unspecified: Secondary | ICD-10-CM

## 2024-06-05 DIAGNOSIS — F325 Major depressive disorder, single episode, in full remission: Secondary | ICD-10-CM

## 2024-07-08 ENCOUNTER — Ambulatory Visit

## 2024-07-17 ENCOUNTER — Other Ambulatory Visit: Payer: Self-pay | Admitting: Adult Health

## 2024-07-17 DIAGNOSIS — F325 Major depressive disorder, single episode, in full remission: Secondary | ICD-10-CM

## 2024-07-20 DIAGNOSIS — H401123 Primary open-angle glaucoma, left eye, severe stage: Secondary | ICD-10-CM | POA: Diagnosis not present

## 2024-07-22 ENCOUNTER — Other Ambulatory Visit: Payer: Self-pay | Admitting: Medical-Surgical

## 2024-07-22 ENCOUNTER — Ambulatory Visit

## 2024-07-22 DIAGNOSIS — F325 Major depressive disorder, single episode, in full remission: Secondary | ICD-10-CM

## 2024-07-22 MED ORDER — ESCITALOPRAM OXALATE 20 MG PO TABS: 20.0000 mg | ORAL_TABLET | Freq: Every day | ORAL | 0 refills | Status: DC

## 2024-07-22 NOTE — Telephone Encounter (Signed)
 Patient requesting rx rf of escitalopram   20mg   Last written by Angeline Ned Sayers, NP  08/312/025 Last OV 03/30/2024 Upcoming appt 09/27/2024

## 2024-07-22 NOTE — Telephone Encounter (Signed)
 Copied from CRM [id number]. Topic: Clinical - Medication Refill >> Jul 22, 2024 11:28 AM Emylou G wrote: Medication: escitalopram  (LEXAPRO ) 20 MG tablet  Has the patient contacted their pharmacy? No (Agent: If no, request that the patient contact the pharmacy for the refill. If patient does not wish to contact the pharmacy document the reason why and proceed with request.) (Agent: If yes, when and what did the pharmacy advise?)  This is the patient's preferred pharmacy:  [id number] - [place], [place] - [address] [place] [address] Phone: [phone] Fax: [phone]  Is this the correct pharmacy for this prescription? Yes If no, delete pharmacy and type the correct one.   Has the prescription been filled recently? No  Is the patient out of the medication? Yes  Has the patient been seen for an appointment in the last year OR does the patient have an upcoming appointment? Yes  Can we respond through MyChart? No  Agent: Please be advised that Rx refills may take up to 3 business days. We ask that you follow-up with your pharmacy.

## 2024-07-24 ENCOUNTER — Other Ambulatory Visit: Payer: Self-pay | Admitting: Medical-Surgical

## 2024-07-24 DIAGNOSIS — E039 Hypothyroidism, unspecified: Secondary | ICD-10-CM

## 2024-07-27 ENCOUNTER — Other Ambulatory Visit: Payer: Self-pay | Admitting: Medical-Surgical

## 2024-07-27 DIAGNOSIS — E039 Hypothyroidism, unspecified: Secondary | ICD-10-CM

## 2024-07-27 NOTE — Telephone Encounter (Signed)
 Copied from CRM #[id number]. Topic: Clinical - Medication Refill >> Jul 27, 2024 10:24 AM Aleatha C wrote: Medication:  levothyroxine  (SYNTHROID ) 50 MCG tablet   Has the patient contacted their pharmacy? Yes (Agent: If no, request that the patient contact the pharmacy for the refill. If patient does not wish to contact the pharmacy document the reason why and proceed with request.) (Agent: If yes, when and what did the pharmacy advise?)  This is the patient's preferred pharmacy:  [id number] - [place], [place] - [address] [place] [address] Phone: [phone] Fax: [phone]  Is this the correct pharmacy for this prescription? Yes If no, delete pharmacy and type the correct one.   Has the prescription been filled recently? No  Is the patient out of the medication? No 7 pills left  Has the patient been seen for an appointment in the last year OR does the patient have an upcoming appointment? Yes  Can we respond through MyChart? No  Agent: Please be advised that Rx refills may take up to 3 business days. We ask that you follow-up with your pharmacy.

## 2024-07-28 ENCOUNTER — Other Ambulatory Visit: Payer: Self-pay | Admitting: Medical-Surgical

## 2024-07-28 DIAGNOSIS — E039 Hypothyroidism, unspecified: Secondary | ICD-10-CM

## 2024-07-28 MED ORDER — LEVOTHYROXINE SODIUM 50 MCG PO TABS: ORAL_TABLET | ORAL | 1 refills | Status: AC

## 2024-07-28 NOTE — Telephone Encounter (Signed)
 Copied from CRM [id number]. Topic: Clinical - Medication Refill >> Jul 28, 2024 12:21 PM Aleatha C wrote: Medication: Refused Levothyroxine  Sodium 50 MCG   Has the patient contacted their pharmacy? No (Agent: If no, request that the patient contact the pharmacy for the refill. If patient does not wish to contact the pharmacy document the reason why and proceed with request.) (Agent: If yes, when and what did the pharmacy advise?)  This is the patient's preferred pharmacy:  [id number] - [place], [place] - [address] [place] [address] Phone: [phone] Fax: [phone]  Is this the correct pharmacy for this prescription? Yes If no, delete pharmacy and type the correct one.   Has the prescription been filled recently? No  Is the patient out of the medication? No 5 left  Has the patient been seen for an appointment in the last year OR does the patient have an upcoming appointment? Yes  Can we respond through MyChart? No  Agent: Please be advised that Rx refills may take up to 3 business days. We ask that you follow-up with your pharmacy.

## 2024-08-04 ENCOUNTER — Ambulatory Visit: Payer: Self-pay | Admitting: Gastroenterology

## 2024-08-14 ENCOUNTER — Ambulatory Visit

## 2024-08-19 ENCOUNTER — Other Ambulatory Visit: Payer: Self-pay | Admitting: Medical-Surgical

## 2024-08-19 ENCOUNTER — Other Ambulatory Visit (HOSPITAL_COMMUNITY): Payer: Self-pay

## 2024-08-19 ENCOUNTER — Telehealth: Payer: Self-pay

## 2024-08-19 DIAGNOSIS — B009 Herpesviral infection, unspecified: Secondary | ICD-10-CM

## 2024-08-19 NOTE — Telephone Encounter (Signed)
 Pharmacy Patient Advocate Encounter  Received notification from [facility] AD[employer]ANT[employer]/RX ADVANCE that Prior Authorization for Cyclobenzaprine  10mg  tabs has been APPROVED from 08/19/24 to 09/16/24   PA #/Case ID/Reference #: [id number]

## 2024-08-19 NOTE — Telephone Encounter (Signed)
 Pharmacy Patient Advocate Encounter   Received notification from Patient Pharmacy that prior authorization for Cyclobenzaprine  10mg  tabs is required/requested.   Insurance verification completed.   The patient is insured through [facility] ADVANTAGE/RX ADVANCE.   Per test claim: PA required; PA submitted to above mentioned insurance via Latent Key/confirmation #/EOC [facility] Status is pending

## 2024-08-25 ENCOUNTER — Other Ambulatory Visit (HOSPITAL_COMMUNITY): Payer: Self-pay

## 2024-09-15 ENCOUNTER — Other Ambulatory Visit: Payer: Self-pay | Admitting: Medical-Surgical

## 2024-09-15 DIAGNOSIS — M1990 Unspecified osteoarthritis, unspecified site: Secondary | ICD-10-CM

## 2024-09-16 ENCOUNTER — Other Ambulatory Visit: Payer: Self-pay | Admitting: Medical-Surgical

## 2024-09-16 DIAGNOSIS — B009 Herpesviral infection, unspecified: Secondary | ICD-10-CM

## 2024-09-22 ENCOUNTER — Other Ambulatory Visit: Payer: Self-pay | Admitting: Medical-Surgical

## 2024-09-22 ENCOUNTER — Ambulatory Visit

## 2024-09-22 DIAGNOSIS — N3941 Urge incontinence: Secondary | ICD-10-CM

## 2024-09-22 DIAGNOSIS — Z1231 Encounter for screening mammogram for malignant neoplasm of breast: Secondary | ICD-10-CM

## 2024-09-22 DIAGNOSIS — J302 Other seasonal allergic rhinitis: Secondary | ICD-10-CM

## 2024-09-22 DIAGNOSIS — E785 Hyperlipidemia, unspecified: Secondary | ICD-10-CM

## 2024-09-27 ENCOUNTER — Ambulatory Visit: Admitting: Medical-Surgical

## 2024-09-27 ENCOUNTER — Ambulatory Visit: Payer: Self-pay | Admitting: Medical-Surgical

## 2024-10-17 ENCOUNTER — Other Ambulatory Visit: Payer: Self-pay | Admitting: Medical-Surgical

## 2024-10-17 DIAGNOSIS — F325 Major depressive disorder, single episode, in full remission: Secondary | ICD-10-CM

## 2024-10-24 ENCOUNTER — Other Ambulatory Visit: Payer: Self-pay | Admitting: Medical-Surgical

## 2024-10-24 DIAGNOSIS — M1990 Unspecified osteoarthritis, unspecified site: Secondary | ICD-10-CM

## 2024-10-25 ENCOUNTER — Other Ambulatory Visit: Payer: Self-pay | Admitting: Medical-Surgical

## 2024-10-25 DIAGNOSIS — B009 Herpesviral infection, unspecified: Secondary | ICD-10-CM

## 2024-11-02 ENCOUNTER — Telehealth: Payer: Self-pay

## 2024-11-02 DIAGNOSIS — J302 Other seasonal allergic rhinitis: Secondary | ICD-10-CM

## 2024-11-02 MED ORDER — LEVOCETIRIZINE DIHYDROCHLORIDE 5 MG PO TABS: 5.0000 mg | ORAL_TABLET | Freq: Every evening | ORAL | 1 refills | Status: AC

## 2024-11-02 NOTE — Telephone Encounter (Signed)
 Copied from CRM [id number]. Topic: Clinical - Medication Refill >> Nov 02, 2024 11:50 AM Delon T wrote: Medication: levocetirizine (XYZAL ) 5 MG tablet  Has the patient contacted their pharmacy? Yes (Agent: If no, request that the patient contact the pharmacy for the refill. If patient does not wish to contact the pharmacy document the reason why and proceed with request.) (Agent: If yes, when and what did the pharmacy advise?)  This is the patient's preferred pharmacy:  [id number] - [place],  - [address] [place] [address] Phone: [phone] Fax: [phone]  Is this the correct pharmacy for this prescription? Yes If no, delete pharmacy and type the correct one.   Has the prescription been filled recently? Yes  Is the patient out of the medication? Yes  Has the patient been seen for an appointment in the last year OR does the patient have an upcoming appointment? Yes  Can we respond through MyChart? Yes  Agent: Please be advised that Rx refills may take up to 3 business days. We ask that you follow-up with your pharmacy.

## 2024-11-02 NOTE — Telephone Encounter (Signed)
 Patient scheduled to come in 11/08/24

## 2024-11-08 ENCOUNTER — Ambulatory Visit: Admitting: Medical-Surgical

## 2024-11-08 ENCOUNTER — Other Ambulatory Visit (HOSPITAL_COMMUNITY): Payer: Self-pay

## 2024-11-11 ENCOUNTER — Ambulatory Visit

## 2024-11-11 ENCOUNTER — Other Ambulatory Visit (HOSPITAL_COMMUNITY): Payer: Self-pay

## 2024-11-11 ENCOUNTER — Telehealth: Payer: Self-pay | Admitting: Pharmacy Technician

## 2024-11-11 NOTE — Telephone Encounter (Signed)
 Pharmacy Patient Advocate Encounter   Received notification from [facility] KEY that prior authorization for Cyclobenzaprine  HCl 10MG  tablets is required/requested.   Insurance verification completed.   The patient is insured through [facility] ADVANTAGE/RX ADVANCE.   Per test claim: PA required; PA submitted to above mentioned insurance via Latent Key/confirmation #/EOC [id number] Status is pending

## 2024-11-11 NOTE — Telephone Encounter (Signed)
 Pharmacy Patient Advocate Encounter  Received notification from [facility] [employer] that Prior Authorization for Cyclobenzaprine  HCl 10MG  tablets has been APPROVED from 11/11/24 to 12/09/24. Ran test claim, Copay is $.48. This test claim was processed through [facility]- copay amounts may vary at other pharmacies due to pharmacy/plan contracts, or as the patient moves through the different stages of their insurance plan.   PA #/Case ID/Reference #: [id number]

## 2024-11-15 ENCOUNTER — Ambulatory Visit: Admitting: Medical-Surgical
